# Patient Record
Sex: Female | Born: 2000 | Race: White | Hispanic: Yes | Marital: Married | Smoking: Never smoker
Health system: Southern US, Community
[De-identification: ages and names within clinical notes are randomized; demographics above are authoritative.]

## PROBLEM LIST (undated history)

## (undated) DIAGNOSIS — J101 Influenza due to other identified influenza virus with other respiratory manifestations: Secondary | ICD-10-CM

## (undated) DIAGNOSIS — N186 End stage renal disease: Secondary | ICD-10-CM

## (undated) DIAGNOSIS — D649 Anemia, unspecified: Secondary | ICD-10-CM

## (undated) DIAGNOSIS — K5900 Constipation, unspecified: Secondary | ICD-10-CM

## (undated) DIAGNOSIS — Q8781 Alport syndrome: Secondary | ICD-10-CM

## (undated) DIAGNOSIS — Z9289 Personal history of other medical treatment: Secondary | ICD-10-CM

## (undated) DIAGNOSIS — I5022 Chronic systolic (congestive) heart failure: Secondary | ICD-10-CM

## (undated) DIAGNOSIS — I10 Essential (primary) hypertension: Secondary | ICD-10-CM

## (undated) DIAGNOSIS — H9190 Unspecified hearing loss, unspecified ear: Secondary | ICD-10-CM

## (undated) DIAGNOSIS — Z98818 Other dental procedure status: Secondary | ICD-10-CM

## (undated) DIAGNOSIS — Z973 Presence of spectacles and contact lenses: Secondary | ICD-10-CM

## (undated) DIAGNOSIS — R000 Tachycardia, unspecified: Secondary | ICD-10-CM

## (undated) DIAGNOSIS — F329 Major depressive disorder, single episode, unspecified: Secondary | ICD-10-CM

## (undated) HISTORY — PX: WISDOM TOOTH EXTRACTION: SHX21

## (undated) HISTORY — PX: NEPHRECTOMY: SHX65

## (undated) HISTORY — DX: Influenza due to other identified influenza virus with other respiratory manifestations: J10.1

## (undated) HISTORY — PX: OTHER SURGICAL HISTORY: SHX169

## (undated) HISTORY — DX: Anemia, unspecified: D64.9

## (undated) HISTORY — DX: Major depressive disorder, single episode, unspecified: F32.9

## (undated) HISTORY — DX: Other dental procedure status: Z98.818

## (undated) HISTORY — DX: Constipation, unspecified: K59.00

## (undated) NOTE — *Deleted (*Deleted)
Pharmacy Resident Rounding Note - for learning purposes only, not an active part of the chart    S/o  Admit Complaint: chest, arm, leg pain w/ tingling. 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 D/cd w/o calcium  Home meds - held entresto for hyperK  Anticoagulation hep sq Infectious Disease Cardiovascular  Carvedilol, hydralazine, amlo, isosorbide Endocrinology cbgs <120 Gastrointestinal / Nutrition Neurology Nephrology - access - LUE AV fistula Aranesp 60 mcg IV qMon for anemia of CKD * Outpatient ESA/iron orders: Mircera 75 every 2 weeks (not started) * Last Tsat 22% and ferritin 406 on 11/15 >> IV iron as outpatient  * Last doses of Aranesp given on 11/15 * Hgb 8.3 on 11/15, next dose due 11/22  Calcium carbonate 800 TID, calcitriol 1 TID>> 1.5 BID, Auryixa 420 TID, renal MVI --11/14 - CaGlu x1g 11/15 CoCa 6.8 >> 8.5 11/16 CoCa 7.8 Dialysis - 3.5Ca bath --Last HD 11/15 - BFR 300 x4h, BPs high 160s  Pulmonary Hematology / Oncology PTA Medication Issues Best Practices  Hypocalcemia w/ ESRD - HD 11/15, aranesp given, next HD 11/17  - trying to get to dry weight of 53.5kg (54.4 on admit) >> not likely  - Goal asymptomatic w/ Ca >7 before d/c, need to d/c w/ po calcium  - Continue current meds, monitor lytes   Cirrhosis -CT -liver density w/ congestion, ascites, asymptomatic -Alk phos trending up, manage vol w/ dialysis

---
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
## 2018-02-24 ENCOUNTER — Encounter: Payer: Self-pay | Admitting: Pediatrics

## 2018-06-11 ENCOUNTER — Ambulatory Visit (INDEPENDENT_AMBULATORY_CARE_PROVIDER_SITE_OTHER): Payer: Medicaid Other | Admitting: *Deleted

## 2018-06-11 DIAGNOSIS — Z23 Encounter for immunization: Secondary | ICD-10-CM | POA: Diagnosis not present

## 2018-08-12 HISTORY — PX: KIDNEY TRANSPLANT: SHX239

## 2018-11-11 ENCOUNTER — Ambulatory Visit: Payer: Medicaid Other | Admitting: Pediatrics

## 2018-11-11 ENCOUNTER — Telehealth: Payer: Self-pay

## 2018-11-11 NOTE — Telephone Encounter (Signed)
Patient has an appointment today. Per Dr. Tami Ribas patient should be seen in [facility] ED, [facility] ED or she should call her transplant doctor for guidance. Patient reports that she has an appointment with transplant doctor tomorrow and that Tylenol is controlling her pain. Stressed need to seek care or advice today. understanding verbalized.

## 2019-02-26 ENCOUNTER — Other Ambulatory Visit: Payer: Self-pay

## 2019-02-26 ENCOUNTER — Observation Stay (HOSPITAL_COMMUNITY)
Admission: EM | Admit: 2019-02-26 | Discharge: 2019-02-26 | Disposition: A | Discharge status: Home / Self Care | Payer: Medicare Other | Attending: Emergency Medicine | Admitting: Emergency Medicine

## 2019-02-26 DIAGNOSIS — N189 Chronic kidney disease, unspecified: Secondary | ICD-10-CM

## 2019-02-26 DIAGNOSIS — D649 Anemia, unspecified: Secondary | ICD-10-CM | POA: Diagnosis present

## 2019-02-26 DIAGNOSIS — N186 End stage renal disease: Secondary | ICD-10-CM | POA: Diagnosis not present

## 2019-02-26 DIAGNOSIS — Z9119 Patient's noncompliance with other medical treatment and regimen: Secondary | ICD-10-CM | POA: Diagnosis not present

## 2019-02-26 DIAGNOSIS — Z992 Dependence on renal dialysis: Secondary | ICD-10-CM | POA: Diagnosis not present

## 2019-02-26 DIAGNOSIS — I120 Hypertensive chronic kidney disease with stage 5 chronic kidney disease or end stage renal disease: Principal | ICD-10-CM | POA: Insufficient documentation

## 2019-02-26 DIAGNOSIS — Q8781 Alport syndrome: Secondary | ICD-10-CM | POA: Diagnosis not present

## 2019-02-26 DIAGNOSIS — N920 Excessive and frequent menstruation with regular cycle: Secondary | ICD-10-CM | POA: Diagnosis not present

## 2019-02-26 DIAGNOSIS — R000 Tachycardia, unspecified: Secondary | ICD-10-CM | POA: Diagnosis not present

## 2019-02-26 DIAGNOSIS — F329 Major depressive disorder, single episode, unspecified: Secondary | ICD-10-CM | POA: Diagnosis not present

## 2019-02-26 DIAGNOSIS — N179 Acute kidney failure, unspecified: Secondary | ICD-10-CM

## 2019-02-26 DIAGNOSIS — D631 Anemia in chronic kidney disease: Secondary | ICD-10-CM | POA: Diagnosis present

## 2019-02-26 LAB — RETICULOCYTES
Immature Retic Fract: 19.9 % — ABNORMAL HIGH (ref 2.3–15.9)
RBC.: 1.56 MIL/uL — ABNORMAL LOW (ref 3.87–5.11)
Retic Count, Absolute: 145.2 10*3/uL (ref 19.0–186.0)
Retic Ct Pct: 9.3 % — ABNORMAL HIGH (ref 0.4–3.1)

## 2019-02-26 LAB — URINALYSIS, ROUTINE W REFLEX MICROSCOPIC
Bilirubin Urine: NEGATIVE
Glucose, UA: 50 mg/dL — AB
Ketones, ur: NEGATIVE mg/dL
Nitrite: NEGATIVE
Protein, ur: 100 mg/dL — AB
Specific Gravity, Urine: 1.008 (ref 1.005–1.030)
pH: 9 — ABNORMAL HIGH (ref 5.0–8.0)

## 2019-02-26 LAB — VITAMIN B12: Vitamin B-12: 245 pg/mL (ref 180–914)

## 2019-02-26 LAB — CBC WITH DIFFERENTIAL/PLATELET
Abs Immature Granulocytes: 0.06 10*3/uL (ref 0.00–0.07)
Abs Immature Granulocytes: 0.09 10*3/uL — ABNORMAL HIGH (ref 0.00–0.07)
Basophils Absolute: 0 10*3/uL (ref 0.0–0.1)
Basophils Absolute: 0 10*3/uL (ref 0.0–0.1)
Basophils Relative: 0 %
Basophils Relative: 1 %
Eosinophils Absolute: 0 10*3/uL (ref 0.0–0.5)
Eosinophils Absolute: 0.1 10*3/uL (ref 0.0–0.5)
Eosinophils Relative: 1 %
Eosinophils Relative: 2 %
HCT: 14.6 % — ABNORMAL LOW (ref 36.0–46.0)
HCT: 22 % — ABNORMAL LOW (ref 36.0–46.0)
Hemoglobin: 4.5 g/dL — CL (ref 12.0–15.0)
Hemoglobin: 7 g/dL — ABNORMAL LOW (ref 12.0–15.0)
Immature Granulocytes: 2 %
Immature Granulocytes: 2 %
Lymphocytes Relative: 22 %
Lymphocytes Relative: 24 %
Lymphs Abs: 0.8 10*3/uL (ref 0.7–4.0)
Lymphs Abs: 1.1 10*3/uL (ref 0.7–4.0)
MCH: 30.2 pg (ref 26.0–34.0)
MCH: 29.5 pg (ref 26.0–34.0)
MCHC: 30.8 g/dL (ref 30.0–36.0)
MCHC: 31.8 g/dL (ref 30.0–36.0)
MCV: 98 fL (ref 80.0–100.0)
MCV: 92.8 fL (ref 80.0–100.0)
Monocytes Absolute: 0.4 10*3/uL (ref 0.1–1.0)
Monocytes Absolute: 0.5 10*3/uL (ref 0.1–1.0)
Monocytes Relative: 11 %
Monocytes Relative: 9 %
Neutro Abs: 2.4 10*3/uL (ref 1.7–7.7)
Neutro Abs: 3 10*3/uL (ref 1.7–7.7)
Neutrophils Relative %: 64 %
Neutrophils Relative %: 62 %
Platelets: 155 10*3/uL (ref 150–400)
Platelets: 170 10*3/uL (ref 150–400)
RBC: 1.49 MIL/uL — ABNORMAL LOW (ref 3.87–5.11)
RBC: 2.37 MIL/uL — ABNORMAL LOW (ref 3.87–5.11)
RDW: 18.9 % — ABNORMAL HIGH (ref 11.5–15.5)
RDW: 16.1 % — ABNORMAL HIGH (ref 11.5–15.5)
WBC: 3.7 10*3/uL — ABNORMAL LOW (ref 4.0–10.5)
WBC: 4.8 10*3/uL (ref 4.0–10.5)
nRBC: 0 % (ref 0.0–0.2)
nRBC: 0 % (ref 0.0–0.2)

## 2019-02-26 LAB — BASIC METABOLIC PANEL
Anion gap: 12 (ref 5–15)
BUN: 20 mg/dL (ref 6–20)
CO2: 28 mmol/L (ref 22–32)
Calcium: 9.2 mg/dL (ref 8.9–10.3)
Chloride: 99 mmol/L (ref 98–111)
Creatinine, Ser: 7.45 mg/dL — ABNORMAL HIGH (ref 0.44–1.00)
GFR calc Af Amer: 8 mL/min — ABNORMAL LOW (ref >60)
GFR calc non Af Amer: 7 mL/min — ABNORMAL LOW (ref >60)
Glucose, Bld: 92 mg/dL (ref 70–99)
Potassium: 3.3 mmol/L — ABNORMAL LOW (ref 3.5–5.1)
Sodium: 139 mmol/L (ref 135–145)

## 2019-02-26 LAB — FERRITIN: Ferritin: 240 ng/mL (ref 11–307)

## 2019-02-26 LAB — ABO/RH: ABO/RH(D): O POS

## 2019-02-26 LAB — IRON AND TIBC
Iron: 43 ug/dL (ref 28–170)
Saturation Ratios: 20 % (ref 10.4–31.8)
TIBC: 218 ug/dL — ABNORMAL LOW (ref 250–450)
UIBC: 175 ug/dL

## 2019-02-26 LAB — PREPARE RBC (CROSSMATCH)

## 2019-02-26 LAB — FOLATE: Folate: 10.2 ng/mL (ref >5.9)

## 2019-02-26 LAB — POC URINE PREG, ED: Preg Test, Ur: NEGATIVE

## 2019-02-26 MED ORDER — ACETAMINOPHEN 650 MG RE SUPP: 650.0000 mg | Freq: Four times a day (QID) | RECTAL | Status: DC | PRN

## 2019-02-26 MED ORDER — TACROLIMUS 4 MG PO TB24: 12.0000 mg | ORAL_TABLET | Freq: Every day | ORAL | Status: DC

## 2019-02-26 MED ORDER — FUROSEMIDE 10 MG/ML IJ SOLN
20.0000 mg | Freq: Once | INTRAMUSCULAR | Status: AC
  Administered 2019-02-26: 20 mg via INTRAVENOUS
  Filled 2019-02-26: qty 2

## 2019-02-26 MED ORDER — POTASSIUM CHLORIDE CRYS ER 20 MEQ PO TBCR
20.0000 meq | EXTENDED_RELEASE_TABLET | Freq: Once | ORAL | Status: AC
  Administered 2019-02-26: 22:00:00 20 meq via ORAL
  Filled 2019-02-26: qty 1

## 2019-02-26 MED ORDER — SULFAMETHOXAZOLE-TRIMETHOPRIM 400-80 MG PO TABS: 1.0000 | ORAL_TABLET | ORAL | Status: DC

## 2019-02-26 MED ORDER — SODIUM CHLORIDE 0.9 % IV SOLN: 10.0000 mL/h | Freq: Once | INTRAVENOUS | Status: DC

## 2019-02-26 MED ORDER — AMLODIPINE BESYLATE 5 MG PO TABS: 10.0000 mg | ORAL_TABLET | Freq: Every day | ORAL | Status: DC

## 2019-02-26 MED ORDER — MYCOPHENOLATE SODIUM 180 MG PO TBEC: 180.0000 mg | DELAYED_RELEASE_TABLET | Freq: Two times a day (BID) | ORAL | Status: DC

## 2019-02-26 MED ORDER — ACETAMINOPHEN 325 MG PO TABS: 650.0000 mg | ORAL_TABLET | Freq: Four times a day (QID) | ORAL | Status: DC | PRN

## 2019-02-26 MED ORDER — PANTOPRAZOLE SODIUM 40 MG PO TBEC: 40.0000 mg | DELAYED_RELEASE_TABLET | Freq: Every day | ORAL | Status: DC

## 2019-02-26 MED ORDER — FUROSEMIDE 20 MG PO TABS: 40.0000 mg | ORAL_TABLET | Freq: Every day | ORAL | Status: DC

## 2019-02-26 MED ORDER — ALLOPURINOL 100 MG PO TABS: 200.0000 mg | ORAL_TABLET | Freq: Every day | ORAL | Status: DC

## 2019-02-26 MED ORDER — PREDNISONE 5 MG PO TABS: 5.0000 mg | ORAL_TABLET | Freq: Every day | ORAL | Status: DC

## 2019-02-26 MED ORDER — VALGANCICLOVIR HCL 450 MG PO TABS: 450.0000 mg | ORAL_TABLET | Freq: Every day | ORAL | Status: DC

## 2019-02-26 MED ORDER — SEVELAMER CARBONATE 800 MG PO TABS: 800.0000 mg | ORAL_TABLET | Freq: Three times a day (TID) | ORAL | Status: DC

## 2019-02-26 MED ORDER — ONDANSETRON 4 MG PO TBDP: 4.0000 mg | ORAL_TABLET | Freq: Three times a day (TID) | ORAL | Status: DC | PRN

## 2019-02-26 NOTE — Consult Note (Addendum)
History and Physical    DOA: 02/26/2019  PCP: Carmie End, MD  Patient coming from: Home/referred through dialysis unit Consult requested : ED physician for admission to Hospitalist service Chief Complaint: Low blood count  HPI: Dominique Lawrence is a 18 y.o. female with history h/o kidney transplant 08/12/2018. 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  Patient is now dialysis dependent 3 times a week (MWF) and follows [facility] in [place].  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 She is continued on immunosuppressants to prevent frank symptoms of rejection but not a candidate for transplant consideration for few years given h/o non compliance per [facility] records. Patient underwent dialysis 2 days back and apparently was told to have a hemoglobin less than 5 when directed to come to the ED but she did not.  She went to dialysis unit today and they refused to dialyze with such a low hemoglobin and referred her to the ED again.  In the emergency department, lab work-up confirmed hemoglobin of 4.5 and hematocrit of 14.6.  Patient is asymptomatic but reports recent episode of menorrhagia with her.  Lasting 7 days as opposed to her norm of 4 days.  ED physician discussed with nephrology and initiated blood transfusion with 3 units ordered.  Hospitalist service requested to admit this patient.   Review of Systems: As per HPI otherwise 10 point review of systems negative.    Past Medical History:  Diagnosis Date  . Alport syndrome   . Alport syndrome   . Constipation 12/18/2013  . Depression 07/28/2013  . Influenza B   . S/P wisdom tooth extraction 06/22/2016   Right mandibular dental pain    No past surgical history on  file.  Social history:  reports that she has never smoked. She has never used smokeless tobacco. She reports that she does not drink alcohol or use drugs.   Allergies  Allergen Reactions  . Enalapril Other (See Comments)    AKI  . Chlorhexidine Gluconate Hives  . Ibuprofen Other (See Comments)    Pt not able to take ibuprofen due to Alport's syndrome    Family History  Problem Relation Age of Onset  . Alport syndrome Brother       Prior to Admission medications   Medication Sig Start Date End Date Taking? Authorizing Provider  amLODipine (NORVASC) 10 MG tablet Take 10 mg by mouth daily.   Yes [provider]  etonogestrel (NEXPLANON) 68 MG IMPL implant 68 mg by Subdermal route once. Jan 2018   Yes [provider]  furosemide (LASIX) 40 MG tablet Take 40 mg by mouth daily.   Yes [provider]  mycophenolate (MYFORTIC) 180 MG EC tablet Take 180 mg by mouth 2 (two) times a day. 02/09/19 05/10/19 Yes [provider]  omeprazole (PRILOSEC) 20 MG capsule Take 20 mg by mouth daily.   Yes [provider]  predniSONE (DELTASONE) 5 MG tablet Take 5 mg by mouth daily with breakfast.   Yes [provider]  sevelamer carbonate (RENVELA) 800 MG tablet Take 800 mg by mouth 3 (three) times daily with meals.   Yes [provider]  sulfamethoxazole-trimethoprim (BACTRIM) 400-80 MG tablet Take 1 tablet by mouth every Monday, Wednesday, and Friday.   Yes [provider]  Tacrolimus 4 MG TB24 Take 12 mg by mouth daily. 02/08/19  Yes [provider]  valGANciclovir (VALCYTE) 450 MG tablet Take 450 mg by mouth daily.   Yes [provider]  allopurinol (ZYLOPRIM) 100 MG tablet Take 2 tablets (200 mg total) by mouth daily. Patient not taking: Reported on 02/26/2019 04/23/17   Caroline More, DO  calcitRIOL (ROCALTROL) 0.5 MCG capsule Take 1 capsule (0.5 mcg total) by mouth daily. Patient not taking: Reported on  02/26/2019 04/22/17   Caroline More, DO  ondansetron (ZOFRAN ODT) 4 MG disintegrating tablet Take 1 tablet (4 mg total) by mouth every 8 (eight) hours as needed for nausea or vomiting. Patient not taking: Reported on 02/26/2019 10/23/17   Recardo Evangelist, PA-C  sodium bicarbonate 650 MG tablet Take 1 tablet (650 mg total) by mouth 2 (two) times daily. Patient not taking: Reported on 02/26/2019 04/22/17   Caroline More, DO    Physical Exam: Vitals:   02/26/19 1348 02/26/19 1350  BP: (!) 140/93 (!) 140/93  Pulse: (!) 120 (!) 117  Resp:  18  Temp:  98.8 F (37.1 C)  TempSrc:  Oral  SpO2: 99% 100%    Constitutional: NAD, calm, comfortable Eyes: PERRL, lids and conjunctivae normal ENMT: Mucous membranes are moist. Posterior pharynx clear of any exudate or lesions.Normal dentition.  Neck: normal, supple, no masses, no thyromegaly Respiratory: clear to auscultation bilaterally, no wheezing, no crackles. Normal respiratory effort. No accessory muscle use.  Cardiovascular: Regular rate and rhythm, no murmurs / rubs / gallops. No extremity edema. 2+ pedal pulses. No carotid bruits.  Abdomen: no tenderness, no masses palpated. No hepatosplenomegaly. Bowel sounds positive.  Musculoskeletal: no clubbing / cyanosis. No joint deformity upper and lower extremities. Good ROM, no contractures. Normal muscle tone.  Neurologic: CN 2-12 grossly intact. Sensation intact, DTR normal. Strength 5/5 in all 4.  Psychiatric: Normal judgment and insight. Alert and oriented x 3. Normal mood.  SKIN/catheters: no rashes, lesions, ulcers. No induration  Labs on Admission: I have personally reviewed following labs and imaging studies  CBC: Recent Labs  Lab 02/26/19 1357  WBC 3.7*  NEUTROABS 2.4  HGB 4.5*  HCT 14.6*  MCV 98.0  PLT 160   Basic Metabolic Panel: Recent Labs  Lab 02/26/19 1357  NA 139  K 3.3*  CL 99  CO2 28  GLUCOSE 92  BUN 20  CREATININE 7.45*  CALCIUM 9.2   GFR: CrCl cannot be  calculated (Unknown ideal weight.). Liver Function Tests: No results for input(s): AST, ALT, ALKPHOS, BILITOT, PROT, ALBUMIN in the last 168 hours. No results for input(s): LIPASE, AMYLASE in the last 168 hours. No results for input(s): AMMONIA in the last 168 hours. Coagulation Profile: No results for input(s): INR, PROTIME in the last 168 hours. Cardiac Enzymes: No results for input(s): CKTOTAL, CKMB, CKMBINDEX, TROPONINI in the last 168 hours. BNP (last 3 results) No results for input(s): PROBNP in the last 8760 hours. HbA1C: No results for input(s): HGBA1C in the last 72 hours. CBG: No results for input(s): GLUCAP in the last 168 hours. Lipid Profile: No results for input(s): CHOL, HDL, LDLCALC, TRIG, CHOLHDL, LDLDIRECT in the last 72 hours. Thyroid Function Tests: No results for input(s): TSH, T4TOTAL, FREET4, T3FREE, THYROIDAB in the last 72 hours. Anemia Panel: No results for input(s): VITAMINB12, FOLATE, FERRITIN, TIBC, IRON, RETICCTPCT in the last 72 hours. Urine analysis:    Component Value Date/Time   COLORURINE STRAW (A) 02/26/2019 1404  APPEARANCEUR CLEAR 02/26/2019 1404   LABSPEC 1.008 02/26/2019 1404   PHURINE 9.0 (H) 02/26/2019 1404   GLUCOSEU 50 (A) 02/26/2019 1404   HGBUR SMALL (A) 02/26/2019 1404   BILIRUBINUR NEGATIVE 02/26/2019 1404   BILIRUBINUR neg 08/11/2017 1139   KETONESUR NEGATIVE 02/26/2019 1404   PROTEINUR 100 (A) 02/26/2019 1404   UROBILINOGEN negative (A) 08/11/2017 1139   UROBILINOGEN 0.2 02/26/2015 1600   NITRITE NEGATIVE 02/26/2019 1404   LEUKOCYTESUR SMALL (A) 02/26/2019 1404    Radiological Exams on Admission: No results found.     Assessment and Plan:   Patient seen earlier today and admission orders placed for observation status with the plan to complete 3 units of PRBC transfusion and Lasix 20 mg IV after second unit.  Patient however declined COVID screening per admission protocol.  Explained the importance of screening  especially given her immunosuppressed state and need for inpatient dialysis.  She stated she would think about it.  She was advised to follow-up PCP/GYN as outpatient for menorrhagia to rule out fibroids.  She also states her baseline hemoglobin is around 7.  She likely has underlying anemia of chronic disease secondary to CKD.  Informed by ED nurse at 7 pm that patient was seen by nephrology subsequent to my visit and they have explained to patient that they would not be able to dialyze her in the hospital if she did not undergo COVID screening but can see her as outpatient.  Discussed with the ED physician/PA who have advised that they will complete 2 units of PRBC in the ED and revisit the plan.  She might sign out AMA.  Will rescind the admission order at this time.  Please reconsult hospitalist service if situation changes or admission to medical service required.    Guilford Shi MD [facility] Pager [phone]  If 7PM-7AM, please contact night-coverage [email or web address] Password [facility]  02/26/2019, 3:31 PM

## 2019-02-26 NOTE — Discharge Instructions (Addendum)
As we discussed, your blood levels were low today.  These return to the emergency department at anytime for any symptoms.

## 2019-02-26 NOTE — ED Provider Notes (Signed)
Care assumed from  Suella Broad, PA-C at shift change with re-evaluation pending.   In brief, this patient is a 18 y.o. F with PMH/o Alport syndrome, attends M/W/F dialysis with last dialysis appointment on Wednesday who presents for evaluation of abnormal lab.  Patient with dialysis and was told that her hemoglobin was low.  She did not come to the ER initially.  She returned to dialysis today but was sent to the ED given low hemoglobin.  Please see note from previous provider for full history/physical exam     Physical Exam  BP (!) 146/110 (BP Location: Right Arm)   Pulse (!) 106   Temp 98 F (36.7 C) (Oral)   Resp (!) 22   LMP 02/16/2019   SpO2 99%   Physical Exam   No evidence of respiratory distress. Comfortably in bed.  ED Course/Procedures   Clinical Course as of Feb 25 2313  Fri Feb 26, 2019  1542 17yo female presents to the ER for anemia, had dialysis on Wednesday and was told she was anemic and needed to go to the ER, patient did not. Patient went back to dialysis today but was not allowed to have dialysis due to her anemia and sent to the ER. Patient is tachycardic and pale on arrival, denies any complaints. Hgb 4.5, plan is transfuse. BMP with K 3.3, Cr 7.45. Consult to nephrology who will see the patient. Consult with hospitlist who will consult for admission. Patient is refusing COVID testing, states she does not feel she should be tested since she does not have any symptoms. Patient states she would rather leave and possibly die than be tested. Patient is agreeable to stay in the ER for now and await her consults.    [LM]  48 17yo female presents to the ER for anemia, had dialysis on Wednesday and was told she was anemic and needed to go to the ER, patient did not. Patient went back to dialysis today but was not allowed to have dialysis due to her anemia and sent to the ER. Patient is tachycardic and pale on arrival, denies any complaints. Hgb 4.5, plan is transfuse.  Consult to nephrology who will see the patient. 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    [LM]    Clinical Course User Index [LM] Tacy Learn, PA-C    Procedures  MDM   PLAN: Patient with hemoglobin of 4.5.  Plan for transfusion.  Additionally, patient in need of dialysis.  Per previous provider, patient refusing COVID testing.  Hospital and nephrology has seen.  If patient not agreeable to COVID testing, plan to leave AMA.  Currently, she is receiving 2 units of blood and Lasix.  She will receive 1/3 unit at which time, we will decide if she will get a COVID test and be admitted or leave AMA.  MDM: Repeat CBC shows hemoglobin at 7.0.  Discussed with patient.  Patient still not agreeable to admission here.  She states that she wants to leave.  I did offer a third transfusion to continue to help with hemoglobin but patient declines.  I discussed the risk versus benefits of leaving [facility], including but not limited to worsening condition, death.  I again offered patient admission and explained my concerns that would  require for admission.  Patient states that she does not want to be admitted and would like to leave [place].  Patient states that it with even without cover testing, she wants to leave.  Patient will be discharged [place].  Portions of this note were generated with Lobbyist. Dictation errors may occur despite best attempts at proofreading.    1. Anemia, unspecified type   2. Acute renal failure superimposed on chronic kidney disease, unspecified CKD stage, unspecified acute renal failure type [facility])        Volanda Napoleon, PA-C 02/26/19 2315    Dorie Rank, MD 02/27/19 [phone]

## 2019-02-26 NOTE — ED Notes (Signed)
Pt actively refusing the covid swab

## 2019-02-26 NOTE — Consult Note (Signed)
Reason for Consult: To manage dialysis and dialysis related needs Referring Physician: Dr. Colin Mulders Dominique Lawrence is an 18 y.o. female.  HPI:  Pt is a 77F with PMH of Ktx for alport syndrome, recently failed and now on HD who presents for low Hgb.  Hgb was 4.4 on HD labs and was directed to come to ED for admission.    Pt says she feels "fine." States she was on her menses from last Sunday to Tuesday.  Initially passing clots and now not.  Doesn't think she's pregnant.  She has declined a COVID test multiple times, stating "I feel fine" and "I don't want it".     She is slated to receive 3 u pRBCs with some Lasix.  She still makes lots of urine.  Last HD Wednesday, due for HD today.  Dialysis orders: [facility] MWF 4 hrs F180  BFR 350 2K/ 2Ca TDC Mircera 75 mcg q 2 weeks Venofer q weekly  Past Medical History:  Diagnosis Date  . Alport syndrome   . Alport syndrome   . Constipation 12/18/2013  . Depression 07/28/2013  . Influenza B   . S/P wisdom tooth extraction 06/22/2016   Right mandibular dental pain    No past surgical history on file.  Family History  Problem Relation Age of Onset  . Alport syndrome Brother     Social History:  reports that she has never smoked. She has never used smokeless tobacco. She reports that she does not drink alcohol or use drugs.  Allergies:  Allergies  Allergen Reactions  . Enalapril Other (See Comments)    AKI  . Chlorhexidine Gluconate Hives  . Ibuprofen Other (See Comments)    Pt not able to take ibuprofen due to Alport's syndrome    Medications: Scheduled:    Results for orders placed or performed during the hospital encounter of 02/26/19 (from the past 48 hour(s))  Basic metabolic panel     Status: Abnormal   Collection Time: 02/26/19  1:57 PM  Result Value Ref Range   Sodium 139 135 - 145 mmol/L   Potassium 3.3 (L) 3.5 - 5.1 mmol/L   Chloride 99 98 - 111 mmol/L   CO2 28 22 - 32 mmol/L   Glucose, Bld 92 70 - 99 mg/dL   BUN  20 6 - 20 mg/dL   Creatinine, Ser 7.45 (H) 0.44 - 1.00 mg/dL   Calcium 9.2 8.9 - 10.3 mg/dL   GFR calc non Af Amer 7 (L) >60 mL/min   GFR calc Af Amer 8 (L) >60 mL/min   Anion gap 12 5 - 15    Comment: Performed at [facility], [address], [place], [place] [address]  CBC with Differential     Status: Abnormal   Collection Time: 02/26/19  1:57 PM  Result Value Ref Range   WBC 3.7 (L) 4.0 - 10.5 K/uL   RBC 1.49 (L) 3.87 - 5.11 MIL/uL   Hemoglobin 4.5 (LL) 12.0 - 15.0 g/dL    Comment: REPEATED TO VERIFY THIS CRITICAL RESULT HAS VERIFIED AND BEEN CALLED TO E.PRYERS RN BY KATHERINE MCCORMICK ON 06 19 2020 AT 0086, AND HAS BEEN READ BACK.     HCT 14.6 (L) 36.0 - 46.0 %   MCV 98.0 80.0 - 100.0 fL   MCH 30.2 26.0 - 34.0 pg   MCHC 30.8 30.0 - 36.0 g/dL   RDW 18.9 (H) 11.5 - 15.5 %   Platelets 155 150 - 400  K/uL   nRBC 0.0 0.0 - 0.2 %   Neutrophils Relative % 64 %   Neutro Abs 2.4 1.7 - 7.7 K/uL   Lymphocytes Relative 22 %   Lymphs Abs 0.8 0.7 - 4.0 K/uL   Monocytes Relative 11 %   Monocytes Absolute 0.4 0.1 - 1.0 K/uL   Eosinophils Relative 1 %   Eosinophils Absolute 0.0 0.0 - 0.5 K/uL   Basophils Relative 0 %   Basophils Absolute 0.0 0.0 - 0.1 K/uL   Immature Granulocytes 2 %   Abs Immature Granulocytes 0.06 0.00 - 0.07 K/uL    Comment: Performed at [facility] [address], [place], [place] [address]  Urinalysis, Routine w reflex microscopic     Status: Abnormal   Collection Time: 02/26/19  2:04 PM  Result Value Ref Range   Color, Urine STRAW (A) YELLOW   APPearance CLEAR CLEAR   Specific Gravity, Urine 1.008 1.005 - 1.030   pH 9.0 (H) 5.0 - 8.0   Glucose, UA 50 (A) NEGATIVE mg/dL   Hgb urine dipstick SMALL (A) NEGATIVE   Bilirubin Urine NEGATIVE NEGATIVE   Ketones, ur NEGATIVE NEGATIVE mg/dL   Protein, ur 100 (A) NEGATIVE mg/dL   Nitrite NEGATIVE NEGATIVE   Leukocytes,Ua SMALL (A) NEGATIVE   RBC / HPF 21-50 0 - 5 RBC/hpf   WBC, UA 21-50 0 - 5 WBC/hpf    Bacteria, UA RARE (A) NONE SEEN   Squamous Epithelial / LPF 0-5 0 - 5   Sperm, UA PRESENT     Comment: Performed at [facility], [address] [place], [place] [address]  POC urine preg, ED     Status: None   Collection Time: 02/26/19  2:28 PM  Result Value Ref Range   Preg Test, Ur NEGATIVE NEGATIVE    Comment:        THE SENSITIVITY OF THIS METHODOLOGY IS >24 mIU/mL   Type and screen [place]     Status: None (Preliminary result)   Collection Time: 02/26/19  2:47 PM  Result Value Ref Range   ABO/RH(D) O POS    Antibody Screen NEG    Sample Expiration 03/01/2019,2359    Unit Number [id number]    Blood Component Type RCLI PHER 2    Unit division 00    Status of Unit ALLOCATED    Transfusion Status OK TO TRANSFUSE    Crossmatch Result Compatible    Unit Number [id number]    Blood Component Type RCLI PHER 2    Unit division 00    Status of Unit ALLOCATED    Transfusion Status OK TO TRANSFUSE    Crossmatch Result Compatible    Unit Number [id number]    Blood Component Type RBC, LR IRR    Unit division 00    Status of Unit ISSUED    Transfusion Status OK TO TRANSFUSE    Crossmatch Result Compatible   Prepare RBC     Status: None   Collection Time: 02/26/19  2:47 PM  Result Value Ref Range   Order Confirmation      ORDER PROCESSED BY BLOOD BANK Performed at [facility], [place] [address] [place], [place] [address]   ABO/Rh     Status: None (Preliminary result)   Collection Time: 02/26/19  2:47 PM  Result Value Ref Range   ABO/RH(D)      O POS Performed at [facility] [address] [place], [place]  [address]     No results found.  ROS: all other systems reviewed and are negative except as per HPI Blood pressure (!) 125/92, pulse (!) 109, temperature 98.5 F (36.9 C), temperature source Oral, resp. rate (!) 24, last menstrual period 02/16/2019, SpO2 100 %. .  GEN: NAD, lying in bed HEENT EOMI PERRL pale  conjunctiva NECK no JVD PULM normal WOB, clear CV tachycardic no m/r/g ABD soft nondistended, graft without tenderness EXT no LE edema NEURO AAO x 3 ACCESS: R IJ TDC  Assessment/Plan: 1 Severe anemia: getting 3 u pRBCs, Lasix.  Goal Hgb 7.0. Got mircera 75 mcg 6/11.   2 ESRD: MWF.  No volume excess on exam for now but she hasn't received the 3 u pRBCs yet.  Will run tomorrow off schedule if here.   3 Hypertension: soft-ish, no antihypertensives 4. 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  Dispo: has repeatedly declined COVID testing  Damian Buckles 02/26/2019, 4:53 PM

## 2019-02-26 NOTE — ED Notes (Signed)
Pt actively refusing COVID swab. Consulted nephrology, admitting and ed doctor. Pt will receive RBC's and then sign an ama form. Dialysis has refused the pt as well as the floor due to the refusal of the swab. Pt will receive 2 blood admins and call for a dialysis appt tomorrow. Pt has been advised to fluid restrict until the dialysis treatment.

## 2019-02-26 NOTE — ED Notes (Signed)
Pt repeatedly and strongly encouraged to stay at hospital for admission and tx of low hgb. Pt adamant about leaving. She was informed that she would be leaving AMA.    Patient verbalizes understanding of discharge instructions. Opportunity for questioning and answers were provided. Armband removed by staff, pt discharged from ED.

## 2019-02-26 NOTE — ED Provider Notes (Signed)
[facility] EMERGENCY DEPARTMENT Provider Note   CSN: [id number] Arrival date & time: 02/26/19  1332    History   Chief Complaint Chief Complaint  Patient presents with  . Tachycardia    HPI Dominique Lawrence is a 18 y.o. female.     18 year old female with history of Alport syndrome, attends dialysis M/W/F, last went to dialysis on Wednesday, renal transplant in December 2019.  Patient states she went to dialysis last on Wednesday, was told her hemoglobin was 4 and she needed to go to the emergency room.  Patient did not come to the ER at that time, returned to dialysis today and did not receive treatment, was redirected to the emergency room.  Patient denies any hematemesis, blood in her stool, states she recently started birth control and had a heavy menstrual cycle this month but does not have a history of prior heavy cycles. Has had 1 prior transfusion years ago, states her normal hgb is 7.  Patient denies chest pain, shortness of breath, weakness, dizziness.  States that she had a slight headache on Monday and did not feel well in general however has not had any other symptoms or complaints.     Past Medical History:  Diagnosis Date  . Alport syndrome   . Alport syndrome   . Constipation 12/18/2013  . Depression 07/28/2013  . Influenza B   . S/P wisdom tooth extraction 06/22/2016   Right mandibular dental pain    Patient Active Problem List   Diagnosis Date Noted  . Nexplanon in place 11/23/2017  . Avitaminosis D 12/23/2015  . Acne vulgaris 09/25/2015  . Kidney cysts 09/13/2015  . Renal insufficiency 09/12/2015  . Constipation 07/07/2015  . Hearing loss 03/01/2015  . Failed vision screen 09/14/2014  . Chronic kidney disease (CKD) stage G1/A1, glomerular filtration rate (GFR) equal to or greater than 90 mL/min/1.73 square meter and albuminuria creatinine ratio less than 30 mg/g 11/13/2013  . 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  . Anemia in chronic  kidney disease 07/28/2013  . Overweight, pediatric, BMI 85.0-94.9 percentile for age 49/19/2014    No past surgical history on file.   OB History   No obstetric history on file.      Home Medications    Prior to Admission medications   Medication Sig Start Date End Date Taking? Authorizing Provider  amLODipine (NORVASC) 10 MG tablet Take 10 mg by mouth daily.   Yes [provider]  etonogestrel (NEXPLANON) 68 MG IMPL implant 68 mg by Subdermal route once. Jan 2018   Yes [provider]  furosemide (LASIX) 40 MG tablet Take 40 mg by mouth daily.   Yes [provider]  mycophenolate (MYFORTIC) 180 MG EC tablet Take 180 mg by mouth 2 (two) times a day. 02/09/19 05/10/19 Yes [provider]  omeprazole (PRILOSEC) 20 MG capsule Take 20 mg by mouth daily.   Yes [provider]  predniSONE (DELTASONE) 5 MG tablet Take 5 mg by mouth daily with breakfast.   Yes [provider]  sevelamer carbonate (RENVELA) 800 MG tablet Take 800 mg by mouth 3 (three) times daily with meals.   Yes [provider]  sulfamethoxazole-trimethoprim (BACTRIM) 400-80 MG tablet Take 1 tablet by mouth every Monday, Wednesday, and Friday.   Yes [provider]  Tacrolimus 4 MG TB24 Take 12 mg by mouth daily. 02/08/19  Yes [provider]  valGANciclovir (VALCYTE) 450 MG tablet Take 450 mg by mouth daily.   Yes  [provider]  allopurinol (ZYLOPRIM) 100 MG tablet Take 2 tablets (200 mg total) by mouth daily. Patient not taking: Reported on 02/26/2019 04/23/17   Caroline More, DO  calcitRIOL (ROCALTROL) 0.5 MCG capsule Take 1 capsule (0.5 mcg total) by mouth daily. Patient not taking: Reported on 02/26/2019 04/22/17   Caroline More, DO  ondansetron (ZOFRAN ODT) 4 MG disintegrating tablet Take 1 tablet (4 mg total) by mouth every 8 (eight) hours as needed for nausea or vomiting. Patient not taking: Reported on 02/26/2019 10/23/17   Recardo Evangelist, PA-C  sodium bicarbonate 650 MG tablet Take 1 tablet (650 mg total) by mouth 2 (two) times daily. Patient not taking: Reported on 02/26/2019 04/22/17   Caroline More, DO    Family History Family History  Problem Relation Age of Onset  . Alport syndrome Brother     Social History Social History   Tobacco Use  . Smoking status: Never Smoker  . Smokeless tobacco: Never Used  Substance Use Topics  . Alcohol use: No  . Drug use: No     Allergies   Enalapril, Chlorhexidine gluconate, and Ibuprofen   Review of Systems Review of Systems  Constitutional: Negative for fatigue and fever.  Respiratory: Negative for shortness of breath.   Cardiovascular: Negative for chest pain.  Gastrointestinal: Negative for abdominal pain, blood in stool, constipation, diarrhea, nausea and vomiting.  Musculoskeletal: Negative for arthralgias and myalgias.  Skin: Negative for wound.  Allergic/Immunologic: Positive for immunocompromised state.  Neurological: Negative for dizziness and weakness.  Psychiatric/Behavioral: Negative for confusion.  All other systems reviewed and are negative.    Physical Exam Updated Vital Signs BP (!) 125/92   Pulse (!) 109   Temp 98.5 F (36.9 C) (Oral)   Resp (!) 24   LMP 02/16/2019   SpO2 100%   Physical Exam Vitals signs and nursing note reviewed.  Constitutional:      General: She is not in acute distress.    Appearance: She is not ill-appearing.  HENT:     Head: Normocephalic and atraumatic.     Comments: Mucosa pale Eyes:     Comments: Conjunctiva pale  Cardiovascular:     Rate and Rhythm: Regular rhythm. Tachycardia present.     Pulses: Normal pulses.     Heart sounds: Normal heart sounds.  Pulmonary:     Effort: Pulmonary effort is normal.     Breath sounds: Normal breath sounds.  Abdominal:     Palpations: Abdomen is soft.     Tenderness: There is no abdominal tenderness.  Skin:    General: Skin is warm and dry.      Coloration: Skin is pale.  Neurological:     General: No focal deficit present.     Mental Status: She is alert.  Psychiatric:        Mood and Affect: Mood normal.        Behavior: Behavior normal.      ED Treatments / Results  Labs (all labs ordered are listed, but only abnormal results are displayed) Labs Reviewed  BASIC METABOLIC PANEL - Abnormal; Notable for the following components:      Result Value   Potassium 3.3 (*)    Creatinine, Ser 7.45 (*)    GFR calc non Af Amer 7 (*)    GFR calc Af Amer 8 (*)    All other components within normal limits  CBC WITH DIFFERENTIAL/PLATELET - Abnormal; Notable for the following components:   WBC 3.7 (*)  RBC 1.49 (*)    Hemoglobin 4.5 (*)    HCT 14.6 (*)    RDW 18.9 (*)    All other components within normal limits  URINALYSIS, ROUTINE W REFLEX MICROSCOPIC - Abnormal; Notable for the following components:   Color, Urine STRAW (*)    pH 9.0 (*)    Glucose, UA 50 (*)    Hgb urine dipstick SMALL (*)    Protein, ur 100 (*)    Leukocytes,Ua SMALL (*)    Bacteria, UA RARE (*)    All other components within normal limits  SARS CORONAVIRUS 2 (HOSPITAL ORDER, [facility])  VITAMIN B12  FOLATE  IRON AND TIBC  FERRITIN  RETICULOCYTES  POC URINE PREG, ED  TYPE AND SCREEN  PREPARE RBC (CROSSMATCH)  ABO/RH    EKG None  Radiology No results found.  Procedures .Critical Care Performed by: Tacy Learn, PA-C Authorized by: Tacy Learn, PA-C   Critical care provider statement:    Critical care time (minutes):  45   Critical care was necessary to treat or prevent imminent or life-threatening deterioration of the following conditions:  Circulatory failure and renal failure   Critical care was time spent personally by me on the following activities:  Discussions with consultants, evaluation of patient's response to treatment, examination of patient, ordering and performing treatments and  interventions, ordering and review of laboratory studies, ordering and review of radiographic studies, pulse oximetry, re-evaluation of patient's condition, obtaining history from patient or surrogate, review of old charts and development of treatment plan with patient or surrogate   (including critical care time)  Medications Ordered in ED Medications  0.9 %  sodium chloride infusion (has no administration in time range)     Initial Impression / Assessment and Plan / ED Course  I have reviewed the triage vital signs and the nursing notes.  Pertinent labs & imaging results that were available during my care of the patient were reviewed by me and considered in my medical decision making (see chart for details).  Clinical Course as of Feb 25 1657  Fri Feb 26, 2019  1542 17yo female presents to the ER for anemia, had dialysis on Wednesday and was told she was anemic and needed to go to the ER, patient did not. Patient went back to dialysis today but was not allowed to have dialysis due to her anemia and sent to the ER. Patient is tachycardic and pale on arrival, denies any complaints. Hgb 4.5, plan is transfuse. BMP with K 3.3, Cr 7.45. Consult to nephrology who will see the patient. Consult with hospitlist who will consult for admission. Patient is refusing COVID testing, states she does not feel she should be tested since she does not have any symptoms. Patient states she would rather leave and possibly die than be tested. Patient is agreeable to stay in the ER for now and await her consults.    [LM]  77 17yo female presents to the ER for anemia, had dialysis on Wednesday and was told she was anemic and needed to go to the ER, patient did not. Patient went back to dialysis today but was not allowed to have dialysis due to her anemia and sent to the ER. Patient is tachycardic and pale on arrival, denies any complaints. Hgb 4.5, plan is transfuse. Consult to nephrology who will see the patient.  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    [LM]    Clinical Course User Index [LM] Tacy Learn, PA-C      Final Clinical Impressions(s) / ED Diagnoses   Final diagnoses:  Anemia, unspecified type  Acute renal failure superimposed on chronic kidney disease, unspecified CKD stage, unspecified acute renal failure type [facility])    ED Discharge Orders    None       Tacy Learn, PA-C 02/26/19 1658    Drenda Freeze, MD 03/04/19 [phone]

## 2019-02-26 NOTE — ED Notes (Signed)
No blood draw at this time,   Pt receiving blood transfusion. 

## 2019-02-26 NOTE — ED Triage Notes (Addendum)
Pt sent by dialysis to get blood. Wend it Hbg 4.4, Been on dialysis a month. Tachy 120. Looks pale. Transplant in December and then had CMV so rejection drugs lowered. Now "Kidney asleep" hoping it will function again but dialysis bridging her over.

## 2019-02-28 LAB — TYPE AND SCREEN
ABO/RH(D): O POS
Antibody Screen: NEGATIVE
Unit division: 0
Unit division: 0
Unit division: 0
Unit division: 0
Unit division: 0

## 2019-02-28 LAB — BPAM RBC
Blood Product Expiration Date: 202007012359
Blood Product Expiration Date: 202007102359
Blood Product Expiration Date: 202007102359
Blood Product Expiration Date: 202007162359
Blood Product Expiration Date: 202007172359
ISSUE DATE / TIME: 202006191557
ISSUE DATE / TIME: 202006191850
Unit Type and Rh: 5100
Unit Type and Rh: 5100
Unit Type and Rh: 5100
Unit Type and Rh: 5100
Unit Type and Rh: 9500

## 2019-04-26 ENCOUNTER — Ambulatory Visit: Payer: Medicare Other | Admitting: Obstetrics and Gynecology

## 2019-05-26 ENCOUNTER — Telehealth: Payer: Self-pay

## 2019-05-26 NOTE — Telephone Encounter (Signed)
Pt called in wanting to speak with Hoyt Koch. She wants to know why her periods have become irregular after her birth control insertion. If someone could reach back out to her at [phone] she would really appreciate it.

## 2019-05-27 ENCOUNTER — Encounter (INDEPENDENT_AMBULATORY_CARE_PROVIDER_SITE_OTHER): Payer: Self-pay | Admitting: Ophthalmology

## 2019-05-27 ENCOUNTER — Encounter (INDEPENDENT_AMBULATORY_CARE_PROVIDER_SITE_OTHER): Payer: Medicare Other | Admitting: Ophthalmology

## 2019-05-27 ENCOUNTER — Other Ambulatory Visit: Payer: Self-pay

## 2019-05-27 ENCOUNTER — Ambulatory Visit (INDEPENDENT_AMBULATORY_CARE_PROVIDER_SITE_OTHER): Payer: Medicare Other | Admitting: Ophthalmology

## 2019-05-27 VITALS — BP 141/107 | HR 99

## 2019-05-27 DIAGNOSIS — H3323 Serous retinal detachment, bilateral: Secondary | ICD-10-CM | POA: Diagnosis not present

## 2019-05-27 DIAGNOSIS — N186 End stage renal disease: Secondary | ICD-10-CM

## 2019-05-27 DIAGNOSIS — Q8781 Alport syndrome: Secondary | ICD-10-CM

## 2019-05-27 DIAGNOSIS — H35033 Hypertensive retinopathy, bilateral: Secondary | ICD-10-CM | POA: Diagnosis not present

## 2019-05-27 DIAGNOSIS — Z992 Dependence on renal dialysis: Secondary | ICD-10-CM

## 2019-05-27 DIAGNOSIS — H3581 Retinal edema: Secondary | ICD-10-CM | POA: Diagnosis not present

## 2019-05-27 NOTE — Progress Notes (Addendum)
[facility] Note  05/27/2019     CHIEF COMPLAINT Patient presents for Retina Evaluation   HISTORY OF PRESENT ILLNESS: Dominique Lawrence Dominique Lawrence is a 18 y.o. female who presents to the clinic today for:   HPI    Retina Evaluation    In right eye.  This started 4 days ago.  Duration of 4 days.  Associated Symptoms Flashes.  Context:  distance vision.  I, the attending physician,  performed the HPI with the patient and updated documentation appropriately.          Comments    New patient retina eval ref by Dr. Frederico Hamman Patient states she woke up on Monday and the center of her vision was missing.  Patient states she has been noticing flashes of light in her left eye since Monday but has not noticed flashes or floaters OD.  Patient denies eye pain.  Patient has Hx of R kidney transplant.       Last edited by Bernarda Caffey, MD on 05/27/2019  8:31 PM. 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 Gevena Cotton who referred her here, pt has also seen Dr. Manuella Ghazi at [facility] in September 2019, she was referred to him by her pediatric kidney dr, pt states after she got her kidney transplant she was being managed by [facility], but since she has had it removed she does not have another appt with them, prior to transplant she was being seen at [facility], pt states that she has been told as long as she is on dialysis she can continue to see the drs there, she states she has been having problems keeping her hemoglobin under control  and her blood pressure has been getting pretty high, pts nephrologist at [facility] is Dr. Ricarda Frame  Referring physician: Gevena Cotton, MD [address],  [place] [address]  HISTORICAL INFORMATION:   Selected notes from the MEDICAL RECORD NUMBER Referred by Dr. Gevena Cotton for concern of RD OD LEE: 09.17.20 Reatha Armour) [BCVA: OD: LP, OS: 20/20] Ocular Hx- PMH-   CURRENT MEDICATIONS: No current outpatient medications on file. (Ophthalmic Drugs)   No current facility-administered medications for this visit.  (Ophthalmic Drugs)   Current Outpatient Medications (Other)  Medication Sig  . allopurinol (ZYLOPRIM) 100 MG tablet Take 2 tablets (200 mg total) by mouth daily. (Patient not taking: Reported on 02/26/2019)  . amLODipine (NORVASC) 10 MG tablet Take 10 mg by mouth daily.  . calcitRIOL (ROCALTROL) 0.5 MCG capsule Take 1 capsule (0.5 mcg total) by mouth daily. (Patient not taking: Reported on 02/26/2019)  . etonogestrel (NEXPLANON) 68 MG IMPL implant 68 mg by Subdermal route once. Jan 2018  . furosemide (LASIX) 40 MG tablet Take 40 mg by mouth daily.  Marland Kitchen omeprazole (PRILOSEC) 20 MG capsule Take 20 mg by mouth daily.  . ondansetron (ZOFRAN ODT) 4 MG disintegrating tablet Take 1 tablet (4 mg total) by mouth every 8 (  eight) hours as needed for nausea or vomiting. (Patient not taking: Reported on 02/26/2019)  . predniSONE (DELTASONE) 5 MG tablet Take 5 mg by mouth daily with breakfast.  . sevelamer carbonate (RENVELA) 800 MG tablet Take 800 mg by mouth 3 (three) times daily with meals.  . sodium bicarbonate 650 MG tablet Take 1 tablet (650 mg total) by mouth 2 (two) times daily. (Patient not taking: Reported on 02/26/2019)  . sulfamethoxazole-trimethoprim (BACTRIM) 400-80 MG tablet Take 1 tablet by mouth every Monday, Wednesday, and Friday.  . Tacrolimus 4 MG TB24 Take 12 mg by mouth daily.  . valGANciclovir (VALCYTE) 450 MG tablet Take 450 mg by mouth daily.    No current facility-administered medications for this visit.  (Other)      REVIEW OF SYSTEMS: ROS    Positive for: Eyes   Negative for: Constitutional, Gastrointestinal, Neurological, Skin, Genitourinary, Musculoskeletal, HENT, Endocrine, Cardiovascular, Respiratory, Psychiatric, Allergic/Imm, Heme/Lymph   Last edited by Doneen Poisson on 05/27/2019  1:47 PM. (History)       ALLERGIES Allergies  Allergen Reactions  . Enalapril Other (See Comments)    AKI  . Chlorhexidine Gluconate Hives  . Ibuprofen Other (See Comments)    Pt not able to take ibuprofen due to Alport's syndrome    PAST MEDICAL HISTORY Past Medical History:  Diagnosis Date  . Alport syndrome   . Alport syndrome   . Constipation 12/18/2013  . Depression 07/28/2013  . Influenza B   . S/P wisdom tooth extraction 06/22/2016   Right mandibular dental pain   History reviewed. No pertinent surgical history.  FAMILY HISTORY Family History  Problem Relation Age of Onset  . Alport syndrome Brother     SOCIAL HISTORY Social History   Tobacco Use  . Smoking status: Never Smoker  . Smokeless tobacco: Never Used  Substance Use Topics  . Alcohol use: No  . Drug use: No         OPHTHALMIC EXAM:  Base Eye Exam    Visual Acuity (Snellen - Linear)      Right Left   Dist cc CF @ 4' 20/30 -2   Dist ph cc NI NI   Correction: Glasses  OD: CF 4' eccentric vision       Tonometry (Tonopen, 2:00 PM)      Right Left   Pressure 18 18       Pupils      Dark Light Shape React APD   Right 7 6 Round 2+ 2+   Left 7 6 Round 2+ 0       Visual Fields      Left Right    Full    Restrictions  Total superior temporal, inferior temporal deficiencies       Extraocular Movement      Right Left    Full Full       Neuro/Psych    Oriented x3: Yes   Mood/Affect: Normal       Dilation    Both eyes: 1.0% Mydriacyl, 2.5% Phenylephrine @ 2:00 PM        Slit Lamp and Fundus Exam    Slit Lamp Exam       Right Left   Lids/Lashes Normal Normal   Conjunctiva/Sclera White and quiet White and quiet   Cornea 2+ Punctate epithelial erosions Clear   Anterior Chamber Deep and quiet Deep and quiet   Iris Round and dilated Round and dilated   Lens Clear, no lenticonus Clear, no lenticonus  Vitreous Mild Vitreous syneresis, mild cell/pigment Mild Vitreous syneresis, mild cell/pigment       Fundus Exam      Right Left   Disc blurred margins, +edema Compact, blurred margins 270 sparring temporal quadrant, partial edema   C/D Ratio 0.1 0.1   Macula Bullous elevation / serous RD, +fleck retinopathy vs Dalen-Fuchs lesions most prominent temporal macula Flat, Blunted foveal reflex, scattered pockets of shallow SRF, +fleck retinopathy vs Dalen Fuchs lesions temporal and inferior macula   Vessels Vascular attenuation, Tortuous Vascular attenuation, Tortuous   Periphery Attached temporally, superiorly and nasally, +bullous SRF inferiorly w/ lattice and atrophic retina within area of detachment, patch of pigmented lattice at 1000 Attached except inferiorly, +SRF inferiorly with lattice degeneration / atrophic retina, fleck retinopathy vs Dalen-Fuchs lesions        Refraction    Wearing Rx      Sphere Cylinder Axis   Right -1.25 +2.75 094   Left +0.00 +1.50 084       Manifest Refraction      Sphere Cylinder Axis Dist VA   Right NI +/-3.00      Left -0.50 +1.50 085 20/25-2          IMAGING AND PROCEDURES  Imaging and Procedures for @TODAY @  OCT, Retina - OU - Both Eyes       Right Eye Quality was good. Progression has no prior data. Findings include subretinal fluid, intraretinal fluid, abnormal foveal contour, subretinal hyper-reflective material (Shallow pockets of SRF; bullous central SRF; disc edema).   Left Eye Quality was good. Central Foveal Thickness: 380. Progression has no prior data. Findings include abnormal foveal contour, subretinal fluid, no IRF, subretinal  hyper-reflective material (Shallow pockets of SRF; disc edema).   Notes *Images captured and stored on drive  Diagnosis / Impression:  OD: pockets of SRF with patches of SRHM; extremely bullous central IRF; +disc edema OS: Shallow pockets of SRF w/ patches of SRHM; +disc edema  Clinical management:  See below  Abbreviations: NFP - Normal foveal profile. CME - cystoid macular edema. PED - pigment epithelial detachment. IRF - intraretinal fluid. SRF - subretinal fluid. EZ - ellipsoid zone. ERM - epiretinal membrane. ORA - outer retinal atrophy. ORT - outer retinal tubulation. SRHM - subretinal hyper-reflective material        Fluorescein Angiography Optos (Transit OD)       Right Eye   Progression has no prior data. Early phase findings include leakage, staining, pooling. Mid/Late phase findings include staining, leakage, pooling.   Left Eye   Progression has no prior data. Early phase findings include leakage, staining. Mid/Late phase findings include leakage, staining.   Notes **Images stored on drive**  Impression: OD: multifocal hyperfluorescent staining and leakage; late hyperfluorescent pooling centrally; hyperfluorescence of disc  OS: multifocal hyperfluorescent staining and leakage; hyperfluorescence of disc                   ASSESSMENT/PLAN:    ICD-10-CM   1. Serous retinal detachment of both eyes  H33.23 Fluorescein Angiography Optos (Transit OD)  2. Retinal edema  H35.81 OCT, Retina - OU - Both Eyes  3. Alport syndrome  Q87.81   4. ESRD (end stage renal disease) on dialysis ([place])  N18.6    Z99.2   5. Hypertensive retinopathy of both eyes  H35.033 Fluorescein Angiography Optos (Transit OD)    1,2. Bilateral Serous Detachment (OD>OS)  - pt reports approx 4 day of history with central vision loss OD  -  first noted while rubbing eye  - BCVA CF@4ft  eccentric, OS: 20/30  - OCT shows +SRF with bullous central IRF OD; OS with scattered patches of shallow  SRF; +disc edema OU  - FA (09.17.20) shows multifocal hyperfluorescent staining and leakage (OD > OS); late hyperfluorescent pooling centrally OD; hyperfluorescence of disc OU  - ?Rhegmatogenous component OU -- +SRF inferiorly with atrophic retina / lattice degeneration  - discussed findings  - differential includes Harada's disease, Harada's-like disease, serous retinal detachment associated with Alport Syndrome and ESRD  - will refer to Dr. Manuella Ghazi, Retina/Uveitis expert at [facility] for evaluation and management and expert opinion  3,4. History of Alport's Syndrome with ESRD on dialysis  - history of kidney transplant 12.04.19, rejection and nephrectomy 8.11.2020  - now getting HD, MWF  - discussed case reports of bilateral serous RD in Alport's syndrome with severe kidney disease  - +fleck retinopathy vs Dalen-Fuchs nodules present on exam  - no lenticonus or other obvious anterior segment ocular abnormalities associated w/ Alport syndrome present on exam  5. Hypertensive retinopathy OU - BP 140s/110 in office today - pt reports labile Bps and anemia on HD - discussed importance of tight BP control   Ophthalmic Meds Ordered this visit:  No orders of the defined types were placed in this encounter.      Return in about 3 weeks (around 06/17/2019) for f/u serous RD OU, DFE, OCT.  There are no Patient Instructions on file for this visit.   Explained the diagnoses, plan, and follow up with the patient and they expressed understanding.  Patient expressed understanding of the importance of proper follow up care.   This document serves as a record of services personally performed by Gardiner Sleeper, MD, PhD. It was created on their behalf by Ernest Mallick, OA, an ophthalmic assistant. The creation of this record is the provider's dictation and/or activities during the visit.    Electronically signed by: Ernest Mallick, OA  09.17.2020 8:41 PM    Gardiner Sleeper, M.D., Ph.D. Diseases & Surgery  of the Retina and Vitreous [place]  I have reviewed the above documentation for accuracy and completeness, and I agree with the above. Gardiner Sleeper, M.D., Ph.D. 05/27/19 8:41 PM    Abbreviations: M myopia (nearsighted); A astigmatism; H hyperopia (farsighted); P presbyopia; Mrx spectacle prescription;  CTL contact lenses; OD right eye; OS left eye; OU both eyes  XT exotropia; ET esotropia; PEK punctate epithelial keratitis; PEE punctate epithelial erosions; DES dry eye syndrome; MGD meibomian gland dysfunction; ATs artificial tears; PFAT's preservative free artificial tears; [place] nuclear sclerotic cataract; PSC posterior subcapsular cataract; ERM epi-retinal membrane; PVD posterior vitreous detachment; RD retinal detachment; DM diabetes mellitus; DR diabetic retinopathy; NPDR non-proliferative diabetic retinopathy; PDR proliferative diabetic retinopathy; CSME clinically significant macular edema; DME diabetic macular edema; dbh dot blot hemorrhages; CWS cotton wool spot; POAG primary open angle glaucoma; C/D cup-to-disc ratio; HVF humphrey visual field; GVF goldmann visual field; OCT optical coherence tomography; IOP intraocular pressure; BRVO Branch retinal vein occlusion; CRVO central retinal vein occlusion; CRAO central retinal artery occlusion; BRAO branch retinal artery occlusion; RT retinal tear; SB scleral buckle; PPV pars plana vitrectomy; VH Vitreous hemorrhage; PRP panretinal laser photocoagulation; IVK intravitreal kenalog; VMT vitreomacular traction; MH Macular hole;  NVD neovascularization of the disc; NVE neovascularization elsewhere; AREDS age related eye disease study; ARMD age related macular degeneration; POAG primary open angle glaucoma; EBMD epithelial/anterior basement membrane dystrophy; ACIOL anterior chamber intraocular  lens; IOL intraocular lens; PCIOL posterior chamber intraocular lens; Phaco/IOL phacoemulsification with intraocular lens placement; [place]  photorefractive keratectomy; LASIK laser assisted in situ keratomileusis; HTN hypertension; DM diabetes mellitus; COPD chronic obstructive pulmonary disease

## 2019-05-27 NOTE — Telephone Encounter (Signed)
Sent patient a mychart message.

## 2019-06-04 ENCOUNTER — Ambulatory Visit: Payer: Medicare Other

## 2019-06-15 ENCOUNTER — Encounter (INDEPENDENT_AMBULATORY_CARE_PROVIDER_SITE_OTHER): Payer: Medicare Other | Admitting: Ophthalmology

## 2019-08-10 ENCOUNTER — Ambulatory Visit: Payer: Self-pay | Admitting: Surgery

## 2019-08-13 ENCOUNTER — Other Ambulatory Visit: Payer: Self-pay

## 2019-08-13 DIAGNOSIS — N181 Chronic kidney disease, stage 1: Secondary | ICD-10-CM

## 2019-08-16 ENCOUNTER — Telehealth (HOSPITAL_COMMUNITY): Payer: Self-pay | Admitting: *Deleted

## 2019-08-16 NOTE — Telephone Encounter (Signed)

## 2019-08-17 ENCOUNTER — Ambulatory Visit (HOSPITAL_COMMUNITY)
Admission: RE | Admit: 2019-08-17 | Discharge: 2019-08-17 | Disposition: A | Discharge status: Home / Self Care | Payer: Medicare Other | Source: Ambulatory Visit | Attending: Vascular Surgery | Admitting: Vascular Surgery

## 2019-08-17 ENCOUNTER — Encounter: Payer: Medicare Other | Admitting: Vascular Surgery

## 2019-08-17 ENCOUNTER — Ambulatory Visit (HOSPITAL_COMMUNITY): Payer: Medicare Other

## 2019-08-17 DIAGNOSIS — N181 Chronic kidney disease, stage 1: Secondary | ICD-10-CM

## 2019-08-23 ENCOUNTER — Observation Stay (HOSPITAL_BASED_OUTPATIENT_CLINIC_OR_DEPARTMENT_OTHER): Payer: Medicare Other

## 2019-08-23 ENCOUNTER — Inpatient Hospital Stay (HOSPITAL_COMMUNITY)
Admission: EM | Admit: 2019-08-23 | Discharge: 2019-08-24 | DRG: 177 | Discharge status: Against Medical Advice | Payer: Medicare Other | Attending: Internal Medicine | Admitting: Internal Medicine

## 2019-08-23 ENCOUNTER — Emergency Department (HOSPITAL_COMMUNITY): Payer: Medicare Other

## 2019-08-23 ENCOUNTER — Other Ambulatory Visit: Payer: Self-pay

## 2019-08-23 ENCOUNTER — Encounter (HOSPITAL_COMMUNITY): Payer: Self-pay | Admitting: Emergency Medicine

## 2019-08-23 ENCOUNTER — Observation Stay (HOSPITAL_COMMUNITY): Payer: Medicare Other

## 2019-08-23 DIAGNOSIS — U071 COVID-19: Secondary | ICD-10-CM | POA: Diagnosis not present

## 2019-08-23 DIAGNOSIS — R7989 Other specified abnormal findings of blood chemistry: Secondary | ICD-10-CM | POA: Diagnosis present

## 2019-08-23 DIAGNOSIS — D849 Immunodeficiency, unspecified: Secondary | ICD-10-CM | POA: Diagnosis present

## 2019-08-23 DIAGNOSIS — I132 Hypertensive heart and chronic kidney disease with heart failure and with stage 5 chronic kidney disease, or end stage renal disease: Secondary | ICD-10-CM | POA: Diagnosis present

## 2019-08-23 DIAGNOSIS — I371 Nonrheumatic pulmonary valve insufficiency: Secondary | ICD-10-CM | POA: Diagnosis not present

## 2019-08-23 DIAGNOSIS — R778 Other specified abnormalities of plasma proteins: Secondary | ICD-10-CM | POA: Diagnosis present

## 2019-08-23 DIAGNOSIS — I161 Hypertensive emergency: Secondary | ICD-10-CM

## 2019-08-23 DIAGNOSIS — Y830 Surgical operation with transplant of whole organ as the cause of abnormal reaction of the patient, or of later complication, without mention of misadventure at the time of the procedure: Secondary | ICD-10-CM | POA: Diagnosis present

## 2019-08-23 DIAGNOSIS — D631 Anemia in chronic kidney disease: Secondary | ICD-10-CM | POA: Diagnosis present

## 2019-08-23 DIAGNOSIS — I420 Dilated cardiomyopathy: Secondary | ICD-10-CM

## 2019-08-23 DIAGNOSIS — N186 End stage renal disease: Secondary | ICD-10-CM

## 2019-08-23 DIAGNOSIS — I313 Pericardial effusion (noninflammatory): Secondary | ICD-10-CM | POA: Diagnosis present

## 2019-08-23 DIAGNOSIS — R0600 Dyspnea, unspecified: Secondary | ICD-10-CM | POA: Diagnosis not present

## 2019-08-23 DIAGNOSIS — R000 Tachycardia, unspecified: Secondary | ICD-10-CM | POA: Diagnosis present

## 2019-08-23 DIAGNOSIS — N179 Acute kidney failure, unspecified: Secondary | ICD-10-CM

## 2019-08-23 DIAGNOSIS — Z992 Dependence on renal dialysis: Secondary | ICD-10-CM

## 2019-08-23 DIAGNOSIS — N2581 Secondary hyperparathyroidism of renal origin: Secondary | ICD-10-CM | POA: Diagnosis present

## 2019-08-23 DIAGNOSIS — Q8781 Alport syndrome: Secondary | ICD-10-CM

## 2019-08-23 DIAGNOSIS — T8612 Kidney transplant failure: Secondary | ICD-10-CM | POA: Diagnosis present

## 2019-08-23 DIAGNOSIS — I10 Essential (primary) hypertension: Secondary | ICD-10-CM | POA: Diagnosis present

## 2019-08-23 DIAGNOSIS — N189 Chronic kidney disease, unspecified: Secondary | ICD-10-CM

## 2019-08-23 DIAGNOSIS — I340 Nonrheumatic mitral (valve) insufficiency: Secondary | ICD-10-CM | POA: Diagnosis not present

## 2019-08-23 DIAGNOSIS — E8729 Other acidosis: Secondary | ICD-10-CM

## 2019-08-23 DIAGNOSIS — J1289 Other viral pneumonia: Secondary | ICD-10-CM | POA: Diagnosis present

## 2019-08-23 DIAGNOSIS — I429 Cardiomyopathy, unspecified: Secondary | ICD-10-CM | POA: Diagnosis present

## 2019-08-23 DIAGNOSIS — R0602 Shortness of breath: Secondary | ICD-10-CM | POA: Diagnosis present

## 2019-08-23 DIAGNOSIS — Z79899 Other long term (current) drug therapy: Secondary | ICD-10-CM

## 2019-08-23 DIAGNOSIS — E872 Acidosis: Secondary | ICD-10-CM | POA: Diagnosis present

## 2019-08-23 DIAGNOSIS — I5021 Acute systolic (congestive) heart failure: Secondary | ICD-10-CM | POA: Diagnosis present

## 2019-08-23 DIAGNOSIS — Z888 Allergy status to other drugs, medicaments and biological substances status: Secondary | ICD-10-CM

## 2019-08-23 DIAGNOSIS — Z793 Long term (current) use of hormonal contraceptives: Secondary | ICD-10-CM

## 2019-08-23 DIAGNOSIS — Z905 Acquired absence of kidney: Secondary | ICD-10-CM

## 2019-08-23 DIAGNOSIS — E875 Hyperkalemia: Secondary | ICD-10-CM | POA: Diagnosis present

## 2019-08-23 DIAGNOSIS — Z9114 Patient's other noncompliance with medication regimen: Secondary | ICD-10-CM

## 2019-08-23 DIAGNOSIS — Z5329 Procedure and treatment not carried out because of patient's decision for other reasons: Secondary | ICD-10-CM | POA: Diagnosis present

## 2019-08-23 LAB — HEPATITIS B SURFACE ANTIGEN: Hepatitis B Surface Ag: NONREACTIVE

## 2019-08-23 LAB — POCT I-STAT EG7
Acid-base deficit: 8 mmol/L — ABNORMAL HIGH (ref 0.0–2.0)
Bicarbonate: 15.7 mmol/L — ABNORMAL LOW (ref 20.0–28.0)
Calcium, Ion: 1.1 mmol/L — ABNORMAL LOW (ref 1.15–1.40)
HCT: 34 % — ABNORMAL LOW (ref 36.0–46.0)
Hemoglobin: 11.6 g/dL — ABNORMAL LOW (ref 12.0–15.0)
O2 Saturation: 55 %
Potassium: 5.1 mmol/L (ref 3.5–5.1)
Sodium: 133 mmol/L — ABNORMAL LOW (ref 135–145)
TCO2: 16 mmol/L — ABNORMAL LOW (ref 22–32)
pCO2, Ven: 27 mmHg — ABNORMAL LOW (ref 44.0–60.0)
pH, Ven: 7.372 (ref 7.250–7.430)
pO2, Ven: 29 mmHg — CL (ref 32.0–45.0)

## 2019-08-23 LAB — FERRITIN: Ferritin: 347 ng/mL — ABNORMAL HIGH (ref 11–307)

## 2019-08-23 LAB — BASIC METABOLIC PANEL
Anion gap: 25 — ABNORMAL HIGH (ref 5–15)
BUN: 49 mg/dL — ABNORMAL HIGH (ref 6–20)
CO2: 14 mmol/L — ABNORMAL LOW (ref 22–32)
Calcium: 9.9 mg/dL (ref 8.9–10.3)
Chloride: 99 mmol/L (ref 98–111)
Creatinine, Ser: 10.36 mg/dL — ABNORMAL HIGH (ref 0.44–1.00)
GFR calc Af Amer: 6 mL/min — ABNORMAL LOW (ref >60)
GFR calc non Af Amer: 5 mL/min — ABNORMAL LOW (ref >60)
Glucose, Bld: 77 mg/dL (ref 70–99)
Potassium: 5.3 mmol/L — ABNORMAL HIGH (ref 3.5–5.1)
Sodium: 138 mmol/L (ref 135–145)

## 2019-08-23 LAB — COMPREHENSIVE METABOLIC PANEL
ALT: 37 U/L (ref 0–44)
AST: 47 U/L — ABNORMAL HIGH (ref 15–41)
Albumin: 3.7 g/dL (ref 3.5–5.0)
Alkaline Phosphatase: 137 U/L — ABNORMAL HIGH (ref 38–126)
Anion gap: 29 — ABNORMAL HIGH (ref 5–15)
BUN: 51 mg/dL — ABNORMAL HIGH (ref 6–20)
CO2: 11 mmol/L — ABNORMAL LOW (ref 22–32)
Calcium: 10.1 mg/dL (ref 8.9–10.3)
Chloride: 96 mmol/L — ABNORMAL LOW (ref 98–111)
Creatinine, Ser: 10.72 mg/dL — ABNORMAL HIGH (ref 0.44–1.00)
GFR calc Af Amer: 5 mL/min — ABNORMAL LOW (ref >60)
GFR calc non Af Amer: 5 mL/min — ABNORMAL LOW (ref >60)
Glucose, Bld: 38 mg/dL — CL (ref 70–99)
Potassium: 5.8 mmol/L — ABNORMAL HIGH (ref 3.5–5.1)
Sodium: 136 mmol/L (ref 135–145)
Total Bilirubin: 2.8 mg/dL — ABNORMAL HIGH (ref 0.3–1.2)
Total Protein: 7 g/dL (ref 6.5–8.1)

## 2019-08-23 LAB — PROCALCITONIN: Procalcitonin: 1.99 ng/mL

## 2019-08-23 LAB — I-STAT BETA HCG BLOOD, ED (MC, WL, AP ONLY): I-stat hCG, quantitative: <5 m[IU]/mL (ref <5)

## 2019-08-23 LAB — CBC
HCT: 37.1 % (ref 36.0–46.0)
HCT: 38.1 % (ref 36.0–46.0)
Hemoglobin: 11.3 g/dL — ABNORMAL LOW (ref 12.0–15.0)
Hemoglobin: 11.4 g/dL — ABNORMAL LOW (ref 12.0–15.0)
MCH: 29.4 pg (ref 26.0–34.0)
MCH: 29.2 pg (ref 26.0–34.0)
MCHC: 30.5 g/dL (ref 30.0–36.0)
MCHC: 29.9 g/dL — ABNORMAL LOW (ref 30.0–36.0)
MCV: 96.4 fL (ref 80.0–100.0)
MCV: 97.4 fL (ref 80.0–100.0)
Platelets: 132 10*3/uL — ABNORMAL LOW (ref 150–400)
Platelets: 147 10*3/uL — ABNORMAL LOW (ref 150–400)
RBC: 3.85 MIL/uL — ABNORMAL LOW (ref 3.87–5.11)
RBC: 3.91 MIL/uL (ref 3.87–5.11)
RDW: 17.8 % — ABNORMAL HIGH (ref 11.5–15.5)
RDW: 18 % — ABNORMAL HIGH (ref 11.5–15.5)
WBC: 5.5 10*3/uL (ref 4.0–10.5)
WBC: 5.8 10*3/uL (ref 4.0–10.5)
nRBC: 0 % (ref 0.0–0.2)
nRBC: 0 % (ref 0.0–0.2)

## 2019-08-23 LAB — D-DIMER, QUANTITATIVE: D-Dimer, Quant: 3.72 ug/mL-FEU — ABNORMAL HIGH (ref 0.00–0.50)

## 2019-08-23 LAB — ECHOCARDIOGRAM COMPLETE

## 2019-08-23 LAB — LACTATE DEHYDROGENASE: LDH: 374 U/L — ABNORMAL HIGH (ref 98–192)

## 2019-08-23 LAB — HEMOGLOBIN A1C
Hgb A1c MFr Bld: 4.1 % — ABNORMAL LOW (ref 4.8–5.6)
Mean Plasma Glucose: 70.97 mg/dL

## 2019-08-23 LAB — C-REACTIVE PROTEIN: CRP: 4.5 mg/dL — ABNORMAL HIGH (ref <1.0)

## 2019-08-23 LAB — TROPONIN I (HIGH SENSITIVITY)
Troponin I (High Sensitivity): 72 ng/L — ABNORMAL HIGH (ref <18)
Troponin I (High Sensitivity): 79 ng/L — ABNORMAL HIGH (ref <18)

## 2019-08-23 LAB — FIBRINOGEN: Fibrinogen: 404 mg/dL (ref 210–475)

## 2019-08-23 LAB — HIV ANTIBODY (ROUTINE TESTING W REFLEX): HIV Screen 4th Generation wRfx: NONREACTIVE

## 2019-08-23 LAB — RESPIRATORY PANEL BY RT PCR (FLU A&B, COVID)
Influenza A by PCR: NEGATIVE
Influenza B by PCR: NEGATIVE
SARS Coronavirus 2 by RT PCR: POSITIVE — AB

## 2019-08-23 LAB — CBG MONITORING, ED
Glucose-Capillary: 17 mg/dL — CL (ref 70–99)
Glucose-Capillary: 90 mg/dL (ref 70–99)
Glucose-Capillary: 87 mg/dL (ref 70–99)
Glucose-Capillary: 97 mg/dL (ref 70–99)

## 2019-08-23 LAB — BRAIN NATRIURETIC PEPTIDE: B Natriuretic Peptide: >4500 pg/mL — ABNORMAL HIGH (ref 0.0–100.0)

## 2019-08-23 LAB — LACTIC ACID, PLASMA: Lactic Acid, Venous: 1.9 mmol/L (ref 0.5–1.9)

## 2019-08-23 MED ORDER — SODIUM CHLORIDE 0.9 % IV SOLN
1.0000 g | INTRAVENOUS | Status: DC
  Administered 2019-08-23: 1 g via INTRAVENOUS
  Filled 2019-08-23 (×2): qty 10

## 2019-08-23 MED ORDER — SEVELAMER CARBONATE 800 MG PO TABS
800.0000 mg | ORAL_TABLET | Freq: Three times a day (TID) | ORAL | Status: DC
  Administered 2019-08-23 – 2019-08-24 (×2): 800 mg via ORAL
  Filled 2019-08-23 (×4): qty 1

## 2019-08-23 MED ORDER — TACROLIMUS 4 MG PO TB24: 1.0000 mg | ORAL_TABLET | Freq: Every day | ORAL | Status: DC

## 2019-08-23 MED ORDER — LIDOCAINE-PRILOCAINE 2.5-2.5 % EX CREA
1.0000 "application " | TOPICAL_CREAM | CUTANEOUS | Status: DC | PRN
  Filled 2019-08-23: qty 5

## 2019-08-23 MED ORDER — PANTOPRAZOLE SODIUM 40 MG PO TBEC
40.0000 mg | DELAYED_RELEASE_TABLET | Freq: Every day | ORAL | Status: DC
  Administered 2019-08-23 – 2019-08-24 (×2): 40 mg via ORAL
  Filled 2019-08-23 (×2): qty 1

## 2019-08-23 MED ORDER — SODIUM BICARBONATE 8.4 % IV SOLN
50.0000 meq | Freq: Once | INTRAVENOUS | Status: AC
  Administered 2019-08-23: 50 meq via INTRAVENOUS
  Filled 2019-08-23: qty 50

## 2019-08-23 MED ORDER — SODIUM POLYSTYRENE SULFONATE 15 GM/60ML PO SUSP
30.0000 g | Freq: Once | ORAL | Status: AC
  Administered 2019-08-23: 30 g via ORAL
  Filled 2019-08-23: qty 120

## 2019-08-23 MED ORDER — SODIUM CHLORIDE 0.9% FLUSH: 3.0000 mL | INTRAVENOUS | Status: DC | PRN

## 2019-08-23 MED ORDER — ACETAMINOPHEN 650 MG RE SUPP: 650.0000 mg | Freq: Four times a day (QID) | RECTAL | Status: DC | PRN

## 2019-08-23 MED ORDER — HEPARIN SODIUM (PORCINE) 1000 UNIT/ML DIALYSIS
1000.0000 [IU] | INTRAMUSCULAR | Status: DC | PRN
  Filled 2019-08-23: qty 1

## 2019-08-23 MED ORDER — SODIUM CHLORIDE 0.9 % IV SOLN
500.0000 mg | INTRAVENOUS | Status: DC
  Administered 2019-08-23 – 2019-08-24 (×2): 500 mg via INTRAVENOUS
  Filled 2019-08-23 (×2): qty 500

## 2019-08-23 MED ORDER — ACETAMINOPHEN 325 MG PO TABS: 650.0000 mg | ORAL_TABLET | Freq: Four times a day (QID) | ORAL | Status: DC | PRN

## 2019-08-23 MED ORDER — FUROSEMIDE 20 MG PO TABS
40.0000 mg | ORAL_TABLET | Freq: Every day | ORAL | Status: DC
  Administered 2019-08-23: 40 mg via ORAL
  Filled 2019-08-23: qty 2

## 2019-08-23 MED ORDER — CALCIUM GLUCONATE-NACL 1-0.675 GM/50ML-% IV SOLN
1.0000 g | Freq: Once | INTRAVENOUS | Status: AC
  Administered 2019-08-23: 1000 mg via INTRAVENOUS
  Filled 2019-08-23: qty 50

## 2019-08-23 MED ORDER — CARVEDILOL 12.5 MG PO TABS
12.5000 mg | ORAL_TABLET | Freq: Two times a day (BID) | ORAL | Status: DC
  Administered 2019-08-23 – 2019-08-24 (×3): 12.5 mg via ORAL
  Filled 2019-08-23 (×3): qty 1

## 2019-08-23 MED ORDER — LIDOCAINE HCL (PF) 1 % IJ SOLN: 5.0000 mL | INTRAMUSCULAR | Status: DC | PRN

## 2019-08-23 MED ORDER — FUROSEMIDE 20 MG PO TABS
80.0000 mg | ORAL_TABLET | ORAL | Status: DC
  Filled 2019-08-23: qty 4

## 2019-08-23 MED ORDER — TACROLIMUS 4 MG PO TB24: 12.0000 mg | ORAL_TABLET | Freq: Every day | ORAL | Status: DC

## 2019-08-23 MED ORDER — DEXTROSE 50 % IV SOLN: 50.0000 mL | Freq: Once | INTRAVENOUS | Status: DC

## 2019-08-23 MED ORDER — PREDNISONE 5 MG PO TABS: 5.0000 mg | ORAL_TABLET | Freq: Every day | ORAL | Status: DC

## 2019-08-23 MED ORDER — LABETALOL HCL 5 MG/ML IV SOLN
10.0000 mg | Freq: Three times a day (TID) | INTRAVENOUS | Status: DC | PRN
  Administered 2019-08-23: 10 mg via INTRAVENOUS
  Filled 2019-08-23: qty 4

## 2019-08-23 MED ORDER — SODIUM CHLORIDE 0.9% FLUSH: 3.0000 mL | Freq: Two times a day (BID) | INTRAVENOUS | Status: DC

## 2019-08-23 MED ORDER — HYDRALAZINE HCL 10 MG PO TABS
10.0000 mg | ORAL_TABLET | Freq: Three times a day (TID) | ORAL | Status: DC
  Administered 2019-08-23 – 2019-08-24 (×3): 10 mg via ORAL
  Filled 2019-08-23 (×5): qty 1

## 2019-08-23 MED ORDER — SODIUM CHLORIDE 0.9 % IV SOLN
200.0000 mg | Freq: Once | INTRAVENOUS | Status: AC
  Administered 2019-08-23: 200 mg via INTRAVENOUS
  Filled 2019-08-23: qty 40

## 2019-08-23 MED ORDER — SULFAMETHOXAZOLE-TRIMETHOPRIM 400-80 MG PO TABS: 1.0000 | ORAL_TABLET | ORAL | Status: DC

## 2019-08-23 MED ORDER — SODIUM CHLORIDE 0.9 % IV SOLN
100.0000 mg | Freq: Every day | INTRAVENOUS | Status: DC
  Administered 2019-08-24: 100 mg via INTRAVENOUS
  Filled 2019-08-23: qty 20

## 2019-08-23 MED ORDER — DOXERCALCIFEROL 4 MCG/2ML IV SOLN: 2.0000 ug | INTRAVENOUS | Status: DC

## 2019-08-23 MED ORDER — VALGANCICLOVIR HCL 450 MG PO TABS: 450.0000 mg | ORAL_TABLET | Freq: Every day | ORAL | Status: DC

## 2019-08-23 MED ORDER — DEXTROSE 50 % IV SOLN
25.0000 g | INTRAVENOUS | Status: AC
  Administered 2019-08-23: 25 g via INTRAVENOUS
  Filled 2019-08-23: qty 50

## 2019-08-23 MED ORDER — SODIUM CHLORIDE 0.9 % IV SOLN: 100.0000 mL | INTRAVENOUS | Status: DC | PRN

## 2019-08-23 MED ORDER — ALTEPLASE 2 MG IJ SOLR: 2.0000 mg | Freq: Once | INTRAMUSCULAR | Status: DC | PRN

## 2019-08-23 MED ORDER — AMLODIPINE BESYLATE 5 MG PO TABS
10.0000 mg | ORAL_TABLET | Freq: Every day | ORAL | Status: DC
  Administered 2019-08-23: 10 mg via ORAL
  Filled 2019-08-23: qty 2

## 2019-08-23 MED ORDER — PENTAFLUOROPROP-TETRAFLUOROETH EX AERO
1.0000 "application " | INHALATION_SPRAY | CUTANEOUS | Status: DC | PRN
  Filled 2019-08-23: qty 30

## 2019-08-23 MED ORDER — SODIUM BICARBONATE 650 MG PO TABS: 650.0000 mg | ORAL_TABLET | Freq: Two times a day (BID) | ORAL | Status: DC

## 2019-08-23 MED ORDER — HYDRALAZINE HCL 20 MG/ML IJ SOLN
10.0000 mg | Freq: Four times a day (QID) | INTRAMUSCULAR | Status: DC | PRN
  Administered 2019-08-23: 10 mg via INTRAVENOUS
  Filled 2019-08-23: qty 1

## 2019-08-23 MED ORDER — SODIUM CHLORIDE 0.9 % IV SOLN: 250.0000 mL | INTRAVENOUS | Status: DC | PRN

## 2019-08-23 MED ORDER — DEXAMETHASONE SODIUM PHOSPHATE 10 MG/ML IJ SOLN
6.0000 mg | INTRAMUSCULAR | Status: DC
  Administered 2019-08-23 – 2019-08-24 (×2): 6 mg via INTRAVENOUS
  Filled 2019-08-23 (×2): qty 1

## 2019-08-23 MED ORDER — HEPARIN SODIUM (PORCINE) 5000 UNIT/ML IJ SOLN
5000.0000 [IU] | Freq: Three times a day (TID) | INTRAMUSCULAR | Status: DC
  Administered 2019-08-23 – 2019-08-24 (×4): 5000 [IU] via SUBCUTANEOUS
  Filled 2019-08-23 (×4): qty 1

## 2019-08-23 MED ORDER — SODIUM CHLORIDE 0.9% FLUSH
3.0000 mL | Freq: Once | INTRAVENOUS | Status: AC
  Administered 2019-08-23: 3 mL via INTRAVENOUS

## 2019-08-23 NOTE — ED Notes (Signed)
Pt given two warm blankets. Pt is resting.

## 2019-08-23 NOTE — ED Notes (Addendum)
Admitting notified of pt elevated D-dimer.Lost phone connection afterwards, no new orders.

## 2019-08-23 NOTE — Consult Note (Signed)
[facility] Renal Consultation Note  Requesting MD: Irene Pap, MD Indication for Consultation: End-stage renal disease  Chief complaint:  Shortness of breath  HPI:  Dominique Lawrence is a 18 y.o. female with a history of end-stage renal disease on dialysis Monday Wednesday Friday at [facility] via right IJ tunneled catheter and s/p failed renal transplant who presented to the hospital with shortness of breath.  She was diagnosed as Covid positive here in the ER.  She is being admitted to medicine.  The patient states that she has been attending dialysis treatment.  She states that she has been off of antihypertensives since August because she states it wasn't helping - has been on amlodipine at home.  She denies any dizziness or cramping.  Note that her dry weight was recently lowered by half a kilo as below for hypertension.  She was found to have a LVEF of 25 to 30% with a moderate pericardial effusion on TTE today.  Cardiology was consulted.  States that she takes one 4 mg tablet three times a day and she still takes the bactrim; she isn't taking valcyte anymore.  Not on prednisone.  Transplant in 08/2018 and has been on dialysis since 01/2019 she reports.   PMHx:   Past Medical History:  Diagnosis Date  . Alport syndrome   . Alport syndrome   . Constipation 12/18/2013  . Depression 07/28/2013  . Influenza B   . S/P wisdom tooth extraction 06/22/2016   Right mandibular dental pain    History reviewed. No pertinent surgical history.  Family Hx:  Family History  Problem Relation Age of Onset  . Alport syndrome Brother     Social History:  reports that she has never smoked. She has never used smokeless tobacco. She reports that she does not drink alcohol or use drugs.  Allergies:  Allergies  Allergen Reactions  . Enalapril Other (See Comments)    AKI  . Chlorhexidine Gluconate Hives  . Ibuprofen Other (See Comments)    Pt not able to take ibuprofen due to  Alport's syndrome    Medications: Prior to Admission medications   Medication Sig Start Date End Date Taking? Authorizing Provider  etonogestrel (NEXPLANON) 68 MG IMPL implant 68 mg by Subdermal route once. Jan 2018   Yes [provider]  sevelamer carbonate (RENVELA) 800 MG tablet Take 800 mg by mouth 3 (three) times daily with meals.   Yes [provider]  Tacrolimus 4 MG TB24 Take 12 mg by mouth daily. 02/08/19  Yes [provider]  amLODipine (NORVASC) 10 MG tablet Take 10 mg by mouth daily.    [provider]    I have reviewed the patient's current medications.  Labs:  BMP Latest Ref Rng & Units 08/23/2019 08/23/2019 08/23/2019  Glucose 70 - 99 mg/dL - 38(LL) 77  BUN 6 - 20 mg/dL - 51(H) 49(H)  Creatinine 0.44 - 1.00 mg/dL - 10.72(H) 10.36(H)  BUN/Creat Ratio 6 - 22 (calc) - - -  Sodium 135 - 145 mmol/L 133(L) 136 138  Potassium 3.5 - 5.1 mmol/L 5.1 5.8(H) 5.3(H)  Chloride 98 - 111 mmol/L - 96(L) 99  CO2 22 - 32 mmol/L - 11(L) 14(L)  Calcium 8.9 - 10.3 mg/dL - 10.1 9.9    Urinalysis    Component Value Date/Time   COLORURINE STRAW (A) 02/26/2019 1404   APPEARANCEUR CLEAR 02/26/2019 1404   LABSPEC 1.008 02/26/2019 1404   PHURINE 9.0 (H) 02/26/2019 1404   GLUCOSEU  50 (A) 02/26/2019 1404   HGBUR SMALL (A) 02/26/2019 1404   BILIRUBINUR NEGATIVE 02/26/2019 1404   BILIRUBINUR neg 08/11/2017 1139   KETONESUR NEGATIVE 02/26/2019 1404   PROTEINUR 100 (A) 02/26/2019 1404   UROBILINOGEN negative (A) 08/11/2017 1139   UROBILINOGEN 0.2 02/26/2015 1600   NITRITE NEGATIVE 02/26/2019 1404   LEUKOCYTESUR SMALL (A) 02/26/2019 1404     ROS:  Pertinent items noted in HPI and remainder of comprehensive ROS otherwise negative.  Physical Exam: Vitals:   08/23/19 1200 08/23/19 1215  BP: (!) 160/125 (!) 154/110  Pulse:    Resp: 18 16  SpO2:       General: Young adult female seated inno acute distress  Normal HEENT: Normocephalic  atraumatic Extraocular movements intact sclera anicteric Neck: Trachea midline neck supple Heart: S1-S2 no rubs appreciated Lungs: Clear to auscultation normal work of breathing on room air Abdomen: Soft nontender nondistended normal bowel sounds Extremities: No edema cyanosis or clubbing Skin: No rash on extremities exposed Neuro: Alert and oriented x3 provides a history follows commands Access right IJ tunneled catheter  Outpatient HD orders:   3 hours and 45 minutes  180 dialyzer BF 350  DF auto 1.5 EDW 58.5 kg  2k/2.5 calcium bath  Tunneled catheter Just lowered from 59 to 58.5 kg  hectorol 2 mcg with HD No ESA ordered - got mircera 225 on 11/30; hb on 12/9 was 10.0   Assessment/Plan:  # End-stage renal disease on hemodialysis - HD per Monday Wednesday Friday schedule with dialysis orders in for today - for now lasix 80 mg PO on non-HD days  # COVID-19 infection Therapies per primary team Optimize volume status  # Acute systolic CHF - Cardiology has been consulted - Optimize volume status with UF.  # pericardial effusion  - UF with HD   # Hypertension - States off of meds at home - Volume removal with dialysis today  # Immunosuppressed - s/p failed renal transplant - on tacrolimus - listed as 12 mg daily however per transplant note is on 1 mg XR daily  - valcyte was stopped per transplant note 05/2019 - have contacted transplant given marked discrepancy in her regimen - discontinued for now as she has not yet received   # Secondary hyperparathyroidism  - on hectorol and renvela   Claudia Desanctis 08/23/2019, 1:07 PM

## 2019-08-23 NOTE — Progress Notes (Signed)
18 year old female with history of Alport syndrome status post failed renal transplant now ESRD on HD Monday Wednesday Friday who presented to [facility] ED with complaints of dyspnea for 3-day duration.  Associated with nonproductive cough.  She denies any chest pain, abdominal pain, nausea or diarrhea at the time of this visit.  In the ED chest x-ray shows moderate cardiomegaly with hazy lung base opacities possibly atelectasis versus infiltrates.  Mildly elevated troponin.  Positive Covid test on 08/23/2019.  Inflammatory markers elevated, procalcitonin also elevated.  Will obtain blood cultures x2 peripherally and start IV remdesivir along with IV antibiotics empirically.  2D echo completed on 08/23/2019 showed LVEF 25 to 30% with moderate pericardial effusion which is a new diagnosis for this 18 year old female.   We appreciate Dr. Harrington Challenger prompt reading of her 2D echo.   Cardiology has been consulted to assist in the management of her newly diagnosed acute systolic CHF. Nephrology has been consulted for hemodialysis.  Discussed with Dr. Royce Macadamia, plan HD today.  Please refer to H&P dictated by my partner Dr. Maudie Mercury on 08/23/2019 for further details of the assessment and plan.  Patient is currently not appropriate for discharge at this time due to ongoing cardiac evaluation and treatment for COVID-19 viral pneumonia.  Patient will require at least 2 midnights for further evaluation and treatment of her present condition.

## 2019-08-23 NOTE — Progress Notes (Signed)
Renal Navigator notes patient's positive COVID 19 test result and notified [facility]. Patient's OP HD schedule will be TTS 12:00pm. She needs to arrive to the clinic at 11:45am and wait in the parking lot to be called in by staff. Per Education officer, museum at [facility] home [facility], patient's family will provide transportation and have been notified of temporary change in clinic while patient is positive for COVID.   [facility] [address] [place], [place]  Alphonzo Cruise, [place] Renal Navigator [phone]

## 2019-08-23 NOTE — ED Triage Notes (Signed)
Pt c/o chest pain and shortness of breath x 1 day. Pt reports symptoms are worse when lying flat. Dialysis MWF, last treatment on Friday.

## 2019-08-23 NOTE — ED Notes (Addendum)
Patient stated she would like to wait until the morning to drink the kayexalate.

## 2019-08-23 NOTE — ED Notes (Signed)
Pt is eating soup that her mom brought her

## 2019-08-23 NOTE — Progress Notes (Signed)
  Echocardiogram 2D Echocardiogram has been performed.  Jennette Dubin 08/23/2019, 9:48 AM

## 2019-08-23 NOTE — ED Provider Notes (Addendum)
[facility] EMERGENCY DEPARTMENT Provider Note   CSN: [id number] Arrival date & time: 08/23/19  0051     History Chief Complaint  Patient presents with  . Shortness of Breath    [facility] [name] is a 18 y.o. female.   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  She began to notice shortness of breath on Saturday which worsened into Sunday.  Shortness of breath worsened with exertion as well as when lying flat.  When she feels increasingly winded she notices a discomfort in her chest, but denies any other chest pain at rest.  Had some anorexia, N/V on Saturday which has improved.  Denies fevers, abdominal pain, hemoptysis, syncope, leg swelling.  Has been compliant with her medications and is still taking Tacrolimus. Previously received most of her care through [facility] Transplant service, but states she was released by them this summer pending her "levels" improving (?creatinine).   Nephrology - Dr. Pearson Grippe Last dialyzed for 4 hours on Friday  The history is provided by the patient. No language interpreter was used.  Shortness of Breath      Past Medical History:  Diagnosis Date  . Alport syndrome   . Alport syndrome   . Constipation 12/18/2013  . Depression 07/28/2013  . Influenza B   . S/P wisdom tooth extraction 06/22/2016   Right mandibular dental pain    Patient Active Problem List   Diagnosis Date Noted  . Severe anemia 02/26/2019  . Nexplanon in place 11/23/2017  . Avitaminosis D 12/23/2015  . Acne vulgaris 09/25/2015  . Kidney cysts 09/13/2015  . Renal insufficiency 09/12/2015  . Constipation 07/07/2015  . Hearing loss 03/01/2015  . Failed vision screen 09/14/2014  . Chronic kidney disease (CKD) stage G1/A1, glomerular filtration rate (GFR) equal to or greater than  90 mL/min/1.73 square meter and albuminuria creatinine ratio less than 30 mg/g 11/13/2013  . 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  . 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 Overweight, pediatric, BMI 85.0-94.9 percentile for age 13/19/2014    History reviewed. No pertinent surgical history.   OB History   No obstetric history on file.     Family History  Problem Relation Age of Onset  . Alport syndrome Brother     Social History   Tobacco Use  . Smoking status: Never Smoker  . Smokeless tobacco: Never Used  Substance Use Topics  . Alcohol use: No  . Drug use: No    Home Medications Prior to Admission medications   Medication Sig Start Date End Date Taking? Authorizing Provider  allopurinol (ZYLOPRIM) 100 MG tablet Take 2 tablets (200 mg total) by mouth daily. Patient not taking: Reported on 02/26/2019 04/23/17   Caroline More, DO  amLODipine (NORVASC) 10 MG tablet Take 10 mg by mouth daily.    [provider]  calcitRIOL (ROCALTROL) 0.5 MCG capsule Take 1 capsule (0.5 mcg total) by mouth daily. Patient not taking: Reported on 02/26/2019 04/22/17   Caroline More, DO  etonogestrel (NEXPLANON) 68 MG IMPL implant 68 mg by Subdermal route once. Jan 2018    [provider]  furosemide (LASIX) 40 MG tablet Take 40 mg by mouth daily.    [provider]  omeprazole (PRILOSEC) 20 MG capsule Take 20 mg by mouth daily.    [provider]  ondansetron [facility]  ODT) 4 MG disintegrating tablet Take 1 tablet (4 mg total) by mouth every 8 (eight) hours as needed for nausea or vomiting. Patient not taking: Reported on 02/26/2019 10/23/17   Recardo Evangelist, PA-C  predniSONE (DELTASONE) 5 MG tablet Take 5 mg by mouth daily with breakfast.    [provider]  sevelamer carbonate (RENVELA) 800 MG tablet Take 800 mg by mouth 3 (three) times daily with meals.    [provider]  sodium bicarbonate 650 MG tablet Take 1 tablet (650 mg total) by  mouth 2 (two) times daily. Patient not taking: Reported on 02/26/2019 04/22/17   Caroline More, DO  sulfamethoxazole-trimethoprim (BACTRIM) 400-80 MG tablet Take 1 tablet by mouth every Monday, Wednesday, and Friday.    [provider]  Tacrolimus 4 MG TB24 Take 12 mg by mouth daily. 02/08/19   [provider]  valGANciclovir (VALCYTE) 450 MG tablet Take 450 mg by mouth daily.    [provider]    Allergies    Enalapril, Chlorhexidine gluconate, and Ibuprofen  Review of Systems   Review of Systems  Respiratory: Positive for shortness of breath.   Ten systems reviewed and are negative for acute change, except as noted in the HPI.    Physical Exam Updated Vital Signs BP (!) 183/141   Pulse (!) 113   Resp (!) 28   SpO2 100%   Physical Exam Vitals and nursing note reviewed.  Constitutional:      General: She is not in acute distress.    Appearance: She is well-developed. She is not diaphoretic.     Comments: Appears ill, pale.  HENT:     Head: Normocephalic and atraumatic.  Eyes:     General: No scleral icterus.    Conjunctiva/sclera: Conjunctivae normal.  Cardiovascular:     Rate and Rhythm: Regular rhythm. Tachycardia present.     Pulses: Normal pulses.  Pulmonary:     Effort: Pulmonary effort is normal. No respiratory distress.     Comments: Dyspneic with tachypnea.  Lungs grossly clear.  Satting 100% on room air. Abdominal:     General: There is no distension.  Musculoskeletal:        General: Normal range of motion.     Cervical back: Normal range of motion.     Comments: No significant lower extremity pitting edema  Skin:    General: Skin is warm and dry.     Coloration: Skin is not pale.     Findings: No erythema or rash.  Neurological:     Mental Status: She is alert and oriented to person, place, and time.     Coordination: Coordination normal.     Comments: Moving all extremities spontaneously  Psychiatric:        Behavior:  Behavior normal.     ED Results / Procedures / Treatments   Labs (all labs ordered are listed, but only abnormal results are displayed) Labs Reviewed  BASIC METABOLIC PANEL - Abnormal; Notable for the following components:      Result Value   Potassium 5.3 (*)    CO2 14 (*)    BUN 49 (*)    Creatinine, Ser 10.36 (*)    GFR calc non Af Amer 5 (*)    GFR calc Af Amer 6 (*)    Anion gap 25 (*)    All other components within normal limits  CBC - Abnormal; Notable for the following components:   RBC 3.85 (*)    Hemoglobin  11.3 (*)    RDW 17.8 (*)    Platelets 132 (*)    All other components within normal limits  POCT I-STAT EG7 - Abnormal; Notable for the following components:   pCO2, Ven 27.0 (*)    pO2, Ven 29.0 (*)    Bicarbonate 15.7 (*)    TCO2 16 (*)    Acid-base deficit 8.0 (*)    Sodium 133 (*)    Calcium, Ion 1.10 (*)    HCT 34.0 (*)    Hemoglobin 11.6 (*)    All other components within normal limits  TROPONIN I (HIGH SENSITIVITY) - Abnormal; Notable for the following components:   Troponin I (High Sensitivity) 72 (*)    All other components within normal limits  RESPIRATORY PANEL BY RT PCR (FLU A&B, COVID)  TACROLIMUS LEVEL  URINALYSIS, ROUTINE W REFLEX MICROSCOPIC  HIV ANTIBODY (ROUTINE TESTING W REFLEX)  CREATININE, SERUM  COMPREHENSIVE METABOLIC PANEL  CBC  I-STAT BETA HCG BLOOD, ED (MC, [facility], AP ONLY)  TROPONIN I (HIGH SENSITIVITY)    EKG EKG Interpretation  Date/Time:  Monday August 23 2019 01:04:47 EST Ventricular Rate:  112 PR Interval:  124 QRS Duration: 74 QT Interval:  350 QTC Calculation: 477 R Axis:   65 Text Interpretation: Sinus tachycardia Nonspecific T wave abnormality Abnormal ECG No significant change since last tracing Confirmed by Deno Etienne [phone]) on 08/23/2019 2:32:42 AM   Radiology DG Chest 2 View  Result Date: 08/23/2019 CLINICAL DATA:  Chest pain and shortness of breath. Dialysis patient. EXAM: CHEST - 2 VIEW COMPARISON:   None. FINDINGS: Right-sided dialysis catheter tip at the atrial caval junction. Moderate cardiomegaly. There are small bilateral pleural effusions and hazy lung base opacities. No convincing pulmonary edema. No pneumothorax. No acute osseous abnormalities are seen. IMPRESSION: 1. Moderate cardiomegaly. Small bilateral pleural effusions and hazy lung base opacities, likely atelectasis. 2.  Right-sided dialysis catheter tip at the atrial caval junction. Electronically Signed   By: Keith Rake M.D.   On: 08/23/2019 01:45    Procedures .Critical Care Performed by: Antonietta Breach, PA-C Authorized by: Antonietta Breach, PA-C   Critical care provider statement:    Critical care time (minutes):  45   Critical care was necessary to treat or prevent imminent or life-threatening deterioration of the following conditions:  Metabolic crisis and renal failure   Critical care was time spent personally by me on the following activities:  Discussions with consultants, evaluation of patient's response to treatment, examination of patient, ordering and performing treatments and interventions, ordering and review of laboratory studies, ordering and review of radiographic studies, pulse oximetry, re-evaluation of patient's condition, obtaining history from patient or surrogate and review of old charts   (including critical care time)  Medications Ordered in ED Medications  sodium chloride flush (NS) 0.9 % injection 3 mL (has no administration in time range)     ED Course  I have reviewed the triage vital signs and the nursing notes.  Pertinent labs & imaging results that were available during my care of the patient were reviewed by me and considered in my medical decision making (see chart for details).   3:34 AM  Case discussed with Dr. Jonnie Finner of nephrology regarding need for more urgent/emergent dialysis. Nephrology to follow. Plan for admission to internal medicine service, unassigned.  3:45 AM Dr. Maudie Mercury  of [facility] to assess in the ED for admission.     MDM Rules/Calculators/A&P   18 year old female with a history of Alport syndrome  presents to the emergency department for evaluation of shortness of breath and dyspnea on exertion, progressive over the past 48 hours.  While her medication list references Norvasc, she states she was taken off her blood pressure medication when released by her transplant center.  Evaluation today concerning for hypertensive urgency/emergency with most recent BP of 183/141.  Troponin elevated at 72, though suspect degree of chronic troponin elevation given history of CKD.  EKG today is largely unchanged from prior.  Chest x-ray with moderate cardiomegaly and small bilateral pleural effusions concerning for fluid overload and need for dialysis.  Despite her shortness of breath complaints, patient satting 100% on room air.  She is not in any distress.  Acute on chronic CKD likely contributing to elevated anion gap acidosis today.  Plan for hospital admission and urgent/emergent dialysis.  Dr. Maudie Mercury of [facility] to admit.  Dr. Jonnie Finner aware and nephrology will follow.  5:06 AM COVID positive on 4-plex screen.   Final Clinical Impression(s) / ED Diagnoses Final diagnoses:  Hypertensive emergency  Acute renal failure superimposed on chronic kidney disease, unspecified CKD stage, unspecified acute renal failure type ([place])  Metabolic acidosis, increased anion gap  COVID-19 virus infection    Rx / DC Orders ED Discharge Orders    None       Antonietta Breach, PA-C 08/23/19 [place], [place], DO 08/23/19 0452    Antonietta Breach, PA-C 08/23/19 0506    Deno Etienne, DO 08/23/19 0522

## 2019-08-23 NOTE — ED Notes (Signed)
RN called dialysis and they said it will be later this afternoon before she would go.

## 2019-08-23 NOTE — Consult Note (Signed)
CARDIOLOGY CONSULT NOTE       Patient ID: Dominique Lawrence MRN: [id number] DOB/AGE: 2001-02-11 18 y.o.  Admit date: 08/23/2019 Referring Physician: Kim,james Primary Physician: Carmie End, MD Primary Cardiologist: New Reason for Consultation: CHF  Principal Problem:   COVID-19 virus infection Active Problems:   Dyspnea   Tachycardia   Elevated troponin   Hypertension   ESRD (end stage renal disease) on dialysis [facility])   HPI:  18 y.o. [place] female originally from [place]. Admitted with dyspnea since Sunday Denies sick contacts loss of smell, diarrhea Has had cough. Noted in ER to be COVID positive. CXR with cardiomegaly and CHF. Echo reviewed with moderate pericardial effusion moderate MR/TR EF 25-30% no tamponade She had a renal transplant at [facility] a year ago that failed. Has been on dialysis since May. Still makes urine BP has been high at dialysis she normally gets it M/W/F.  Currently comfortable with no chest pain tachyhpnea. Has not had dialysis yet today She is immunocompromised on Tacrolimus.    ROS All other systems reviewed and negative except as noted above  Past Medical History:  Diagnosis Date  . Alport syndrome   . Alport syndrome   . Constipation 12/18/2013  . Depression 07/28/2013  . Influenza B   . S/P wisdom tooth extraction 06/22/2016   Right mandibular dental pain    Family History  Problem Relation Age of Onset  . Alport syndrome Brother     Social History   Socioeconomic History  . Marital status: Married    Spouse name: Not on file  . Number of children: Not on file  . Years of education: Not on file  . Highest education level: Not on file  Occupational History  . Not on file  Tobacco Use  . Smoking status: Never Smoker  . Smokeless tobacco: Never Used  Substance and Sexual Activity  . Alcohol use: No  . Drug use: No  . Sexual activity: Not Currently    Birth control/protection: Implant  Other Topics Concern  .  Not on file  Social History Narrative   Lives with Mom, Sister, & 2 Brothers. No pets at home. No one smokes.   Social Determinants of Health   Financial Resource Strain:   . Difficulty of Paying Living Expenses: Not on file  Food Insecurity:   . Worried About Charity fundraiser in the Last Year: Not on file  . Ran Out of Food in the Last Year: Not on file  Transportation Needs:   . Lack of Transportation (Medical): Not on file  . Lack of Transportation (Non-Medical): Not on file  Physical Activity:   . Days of Exercise per Week: Not on file  . Minutes of Exercise per Session: Not on file  Stress:   . Feeling of Stress : Not on file  Social Connections:   . Frequency of Communication with Friends and Family: Not on file  . Frequency of Social Gatherings with Friends and Family: Not on file  . Attends Religious Services: Not on file  . Active Member of Clubs or Organizations: Not on file  . Attends Archivist Meetings: Not on file  . Marital Status: Not on file  Intimate Partner Violence:   . Fear of Current or Ex-Partner: Not on file  . Emotionally Abused: Not on file  . Physically Abused: Not on file  . Sexually Abused: Not on file    History reviewed. No pertinent surgical history.   Marland Kitchen  amLODipine  10 mg Oral Daily  . carvedilol  12.5 mg Oral BID WC  . dexamethasone (DECADRON) injection  6 mg Intravenous Q24H  . furosemide  40 mg Oral Daily  . heparin  5,000 Units Subcutaneous Q8H  . pantoprazole  40 mg Oral Daily  . sevelamer carbonate  800 mg Oral TID WC  . sodium bicarbonate  650 mg Oral BID  . sodium chloride flush  3 mL Intravenous Q12H  . sulfamethoxazole-trimethoprim  1 tablet Oral Q M,W,F  . Tacrolimus  12 mg Oral Daily  . valGANciclovir  450 mg Oral Daily   . sodium chloride      Physical Exam:   Affect appropriate [place] female  HEENT: normal Neck supple with no adenopathy Right subclavian dialysis catheter  Lungs clear with no wheezing  and good diaphragmatic motion Heart:  S1/S2 no murmur, no rub, gallop or click PMI normal Abdomen: benighn, BS positve, no tenderness, no AAA no bruit.  No HSM or HJR transplanted kidney in LLQ Distal pulses intact with no bruits No edema Neuro non-focal Skin warm and dry No muscular weakness   Labs:   Lab Results  Component Value Date   WBC 5.8 08/23/2019   HGB 11.6 (L) 08/23/2019   HCT 34.0 (L) 08/23/2019   MCV 97.4 08/23/2019   PLT 147 (L) 08/23/2019    Recent Labs  Lab 08/23/19 0303 08/23/19 0346  NA 136 133*  K 5.8* 5.1  CL 96*  --   CO2 11*  --   BUN 51*  --   CREATININE 10.72*  --   CALCIUM 10.1  --   PROT 7.0  --   BILITOT 2.8*  --   ALKPHOS 137*  --   ALT 37  --   AST 47*  --   GLUCOSE 38*  --    No results found for: CKTOTAL, CKMB, CKMBINDEX, TROPONINI  Lab Results  Component Value Date   CHOL 157 01/06/2014   CHOL 190 (H) 07/28/2013   Lab Results  Component Value Date   HDL 38 01/06/2014   HDL 39 07/28/2013   Lab Results  Component Value Date   LDLCALC 74 01/06/2014   Lab Results  Component Value Date   TRIG 224 (H) 01/06/2014   Lab Results  Component Value Date   CHOLHDL 4.1 01/06/2014   No results found for: LDLDIRECT    Radiology: DG Chest 2 View  Result Date: 08/23/2019 CLINICAL DATA:  Chest pain and shortness of breath. Dialysis patient. EXAM: CHEST - 2 VIEW COMPARISON:  None. FINDINGS: Right-sided dialysis catheter tip at the atrial caval junction. Moderate cardiomegaly. There are small bilateral pleural effusions and hazy lung base opacities. No convincing pulmonary edema. No pneumothorax. No acute osseous abnormalities are seen. IMPRESSION: 1. Moderate cardiomegaly. Small bilateral pleural effusions and hazy lung base opacities, likely atelectasis. 2.  Right-sided dialysis catheter tip at the atrial caval junction. Electronically Signed   By: Keith Rake M.D.   On: 08/23/2019 01:45   ECHOCARDIOGRAM COMPLETE  Result Date:  08/23/2019   ECHOCARDIOGRAM REPORT   Patient Name:   Dominique Lawrence Date of Exam: 08/23/2019 Medical Rec #:  [id number]                     Height:       65.0 in Accession #:    [id number]  Weight:       163.6 lb Date of Birth:  2001/02/07                    BSA:          1.82 m Patient Age:    17 years                      BP:           178/133 mmHg Patient Gender: F                             HR:           101 bpm. Exam Location:  Inpatient Procedure: 2D Echo Indications:    Dyspnea R06.00  History:        Patient has no prior history of Echocardiogram examinations.                 Covid-19 Positive.  Sonographer:    Mikki Santee RDCS (AE) Referring Phys: [place]  1. Left ventricular ejection fraction, by visual estimation, is 25 to 30%. The left ventricle has severely decreased function. There is no left ventricular hypertrophy.  2. The left ventricle demonstrates global hypokinesis.  3. Global right ventricle has mildly reduced systolic function.The right ventricular size is normal. No increase in right ventricular wall thickness.  4. Left atrial size was severely dilated.  5. Right atrial size was normal.  6. Moderate pericardial effusion.  7. Pericardial effusion does not appear to be causing hemodynamic compromise by echo criteria. CLinical correlation indicated.  8. The mitral valve is normal in structure. Moderate mitral valve regurgitation.  9. The tricuspid valve is normal in structure. Tricuspid valve regurgitation is trivial. 10. The aortic valve is normal in structure. Aortic valve regurgitation is trivial. 11. Pulmonic regurgitation is mild. 12. The pulmonic valve was normal in structure. Pulmonic valve regurgitation is mild. 13. Moderately elevated pulmonary artery systolic pressure. 14. The inferior vena cava is normal in size with <50% respiratory variability, suggesting right atrial pressure of 8 mmHg. FINDINGS  Left Ventricle: Left  ventricular ejection fraction, by visual estimation, is 25 to 30%. The left ventricle has severely decreased function. The left ventricle demonstrates global hypokinesis. The left ventricular internal cavity size was the left ventricle is normal in size. There is no left ventricular hypertrophy. Right Ventricle: The right ventricular size is normal. No increase in right ventricular wall thickness. Global RV systolic function is has mildly reduced systolic function. The tricuspid regurgitant velocity is 3.00 m/s, and with an assumed right atrial pressure of 8 mmHg, the estimated right ventricular systolic pressure is moderately elevated at 44.1 mmHg. Left Atrium: Left atrial size was severely dilated. Right Atrium: Right atrial size was normal in size Pericardium: A moderately sized pericardial effusion is present. Pericardial effusion does not appear to be causing hemodynamic compromise by echo criteria. CLinical correlation indicated. Mitral Valve: The mitral valve is normal in structure. Moderate mitral valve regurgitation. Tricuspid Valve: The tricuspid valve is normal in structure. Tricuspid valve regurgitation is trivial. Aortic Valve: The aortic valve is normal in structure. Aortic valve regurgitation is trivial. Pulmonic Valve: The pulmonic valve was normal in structure. Pulmonic valve regurgitation is mild. Pulmonic regurgitation is mild. Aorta: The aortic root is normal in size and structure. Venous: The inferior vena cava is normal in size with less than 50% respiratory variability,  suggesting right atrial pressure of 8 mmHg. IAS/Shunts: No atrial level shunt detected by color flow Doppler.  LEFT VENTRICLE PLAX 2D LVIDd:         5.15 cm  Diastology LVIDs:         4.47 cm  LV e' lateral: 8.05 cm/s LV PW:         1.01 cm  LV e' medial:  5.22 cm/s LV IVS:        0.55 cm LVOT diam:     2.00 cm LV SV:         36 ml LV SV Index:   19.14 LVOT Area:     3.14 cm  RIGHT VENTRICLE TAPSE (M-mode): 1.2 cm LEFT ATRIUM              Index       RIGHT ATRIUM           Index LA diam:        4.60 cm 2.53 cm/m  RA Area:     12.20 cm LA Vol (A2C):   93.7 ml 51.61 ml/m RA Volume:   29.60 ml  16.30 ml/m LA Vol (A4C):   95.5 ml 52.60 ml/m LA Biplane Vol: 99.7 ml 54.92 ml/m  AORTIC VALVE LVOT Vmax:   70.80 cm/s LVOT Vmean:  43.700 cm/s LVOT VTI:    0.100 m  AORTA Ao Root diam: 3.00 cm MR Peak grad:    142.6 mmHg  TRICUSPID VALVE MR Mean grad:    93.0 mmHg   TR Peak grad:   36.1 mmHg MR Vmax:         597.00 cm/s TR Vmax:        324.00 cm/s MR Vmean:        445.0 cm/s MR PISA:         1.57 cm    SHUNTS MR PISA Eff ROA: 9 mm       Systemic VTI:  0.10 m MR PISA Radius:  0.50 cm     Systemic Diam: 2.00 cm  Dorris Carnes MD Electronically signed by Dorris Carnes MD Signature Date/Time: 08/23/2019/11:04:02 AM    Final     EKG: NSR no acute changes no changes of pericarditis    ASSESSMENT AND PLAN:   1. 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 Volume management per renal needs dialysis today Does not appear volume overloaded on exam. Normally only on 40 mg daily of lasix which was cut back to only during dialysis days. Will leave up to real but given Cr 10.7 would suggest that 80 iv bid would be better in setting of CHF with BNP >4500 Will also start hydralazine No ACE due to CRF At some point may benefit from cardiac MRI in regard to etiology of low EF / ? Myocarditis   2. COVID:  Rx aggressively given possible myocarditis and low EF Troponin is negative Decadron and remdesivir given   3. CRF:  Needs to see [facility] regarding fistula. Dialysis today draw more fluid off than usual given CHF and new diagnosis of systolic dysfunction   4. Pericardial Effusion: moderate no tamponade repeat echo in 2-3 weeks No pericardial changes on ECG presence suggests possible myocarditis but troponin only in 70's NSAI's and colchicine at primary service discretion   Signed: Jenkins Rouge 08/23/2019,  12:03 PM

## 2019-08-23 NOTE — ED Notes (Signed)
Admitting paged 

## 2019-08-23 NOTE — ED Notes (Signed)
bfast tray given

## 2019-08-23 NOTE — H&P (Signed)
TRH H&P    Patient Demographics:    Dominique Lawrence, is a 18 y.o. female  MRN: [id number]  DOB - 07/04/2001  Admit Date - 08/23/2019  Referring MD/NP/PA: Deno Etienne  Outpatient Primary MD for the patient is Ettefagh, Paul Dykes, MD  Patient coming from:  home  Chief complaint- dyspnea   HPI:    Dominique Lawrence  is a 18 y.o. 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  Pt denies fever, chiills cough, cp, palp, n/v, diarrhea, brbpr, black stool.   In ED   CXR IMPRESSION: 1. Moderate cardiomegaly. Small bilateral pleural effusions and hazy lung base opacities, likely atelectasis.2.  Right-sided dialysis catheter tip at the atrial caval junction.  T afebrile P 113, R 26, Bp 192/144  Pox 100% on RA  Wbc 5.5, Hgb 11.3, Plt 132  Na 138, K 5.3, Bun 49, Creatinine 10.36 Trop 72  Sars Covid positive  Pt will be admitted for dyspnea secondary to covid-19 infection, as well as hyperkalemia.         Review of systems:    In addition to the HPI above,  No Fever-chills, No Headache, No changes with Vision or hearing, No problems swallowing food or Liquids, No Chest pain, Cough   No Abdominal pain, No Nausea or Vomiting, bowel movements are regular, No Blood in stool or Urine, No dysuria, No new skin rashes or bruises, No new joints pains-aches,  No new weakness, tingling, numbness in any extremity, No recent weight gain or loss, No polyuria, polydypsia or polyphagia, No significant Mental Stressors.  All other systems reviewed and are negative.    Past History of the following :    Past Medical History:  Diagnosis Date  . Alport syndrome   . Alport syndrome   . Constipation 12/18/2013  . Depression 07/28/2013  . Influenza B   . S/P wisdom tooth extraction 06/22/2016   Right mandibular dental pain        History reviewed. No pertinent surgical history.    Social History:      Social History   Tobacco Use  . Smoking status: Never Smoker  . Smokeless tobacco: Never Used  Substance Use Topics  . Alcohol use: No       Family History :     Family History  Problem Relation Age of Onset  . Alport syndrome Brother        Home Medications:   Prior to Admission medications   Medication Sig Start Date End Date Taking? Authorizing Provider  allopurinol (ZYLOPRIM) 100 MG tablet Take 2 tablets (200 mg total) by mouth daily. Patient not taking: Reported on 02/26/2019 04/23/17   Caroline More, DO  amLODipine (NORVASC) 10 MG tablet Take 10 mg by mouth daily.    [provider]  calcitRIOL (ROCALTROL) 0.5 MCG capsule Take 1 capsule (0.5 mcg total) by mouth daily. Patient not taking: Reported on 02/26/2019 04/22/17   Caroline More, DO  etonogestrel (NEXPLANON) 68 MG  IMPL implant 68 mg by Subdermal route once. Jan 2018    [provider]  furosemide (LASIX) 40 MG tablet Take 40 mg by mouth daily.    [provider]  omeprazole (PRILOSEC) 20 MG capsule Take 20 mg by mouth daily.    [provider]  ondansetron (ZOFRAN ODT) 4 MG disintegrating tablet Take 1 tablet (4 mg total) by mouth every 8 (eight) hours as needed for nausea or vomiting. Patient not taking: Reported on 02/26/2019 10/23/17   Recardo Evangelist, PA-C  predniSONE (DELTASONE) 5 MG tablet Take 5 mg by mouth daily with breakfast.    [provider]  sevelamer carbonate (RENVELA) 800 MG tablet Take 800 mg by mouth 3 (three) times daily with meals.    [provider]  sodium bicarbonate 650 MG tablet Take 1 tablet (650 mg total) by mouth 2 (two) times daily. Patient not taking: Reported on 02/26/2019 04/22/17   Caroline More, DO  sulfamethoxazole-trimethoprim (BACTRIM) 400-80 MG tablet Take 1 tablet by mouth every Monday, Wednesday, and Friday.    [provider]   Tacrolimus 4 MG TB24 Take 12 mg by mouth daily. 02/08/19   [provider]  valGANciclovir (VALCYTE) 450 MG tablet Take 450 mg by mouth daily.    [provider]     Allergies:     Allergies  Allergen Reactions  . Enalapril Other (See Comments)    AKI  . Chlorhexidine Gluconate Hives  . Ibuprofen Other (See Comments)    Pt not able to take ibuprofen due to Alport's syndrome     Physical Exam:   Vitals  Blood pressure (!) 183/141, pulse (!) 113, resp. rate (!) 28, SpO2 100 %.  1.  General: axoxo3  2. Psychiatric: euthymic  3. Neurologic: nonfocal  4. HEENMT:  Anicteric, pupils 1.21mm symmetric, direct, consensual near intact Neck: no jvd  5. Respiratory : CTAB  6. Cardiovascular : rrr s1, s2, no m/g/r + dialysis catheter in the R upper chest  7. Gastrointestinal:  Abd: soft, nt, n,d +bs  8. Skin:  Ext: no c/c/e, no rash  9.Musculoskeletal:  Good ROM    Data Review:    CBC Recent Labs  Lab 08/23/19 0115 08/23/19 0346  WBC 5.5  --   HGB 11.3* 11.6*  HCT 37.1 34.0*  PLT 132*  --   MCV 96.4  --   MCH 29.4  --   MCHC 30.5  --   RDW 17.8*  --    ------------------------------------------------------------------------------------------------------------------  Results for orders placed or performed during the hospital encounter of 08/23/19 (from the past 48 hour(s))  Basic metabolic panel     Status: Abnormal   Collection Time: 08/23/19  1:15 AM  Result Value Ref Range   Sodium 138 135 - 145 mmol/L   Potassium 5.3 (H) 3.5 - 5.1 mmol/L   Chloride 99 98 - 111 mmol/L   CO2 14 (L) 22 - 32 mmol/L   Glucose, Bld 77 70 - 99 mg/dL   BUN 49 (H) 6 - 20 mg/dL   Creatinine, Ser 10.36 (H) 0.44 - 1.00 mg/dL   Calcium 9.9 8.9 - 10.3 mg/dL   GFR calc non Af Amer 5 (L) >60 mL/min   GFR calc Af Amer 6 (L) >60 mL/min   Anion gap 25 (H) 5 - 15    Comment: Performed at [facility], [address], [place], [place] [address]  CBC      Status: Abnormal  Collection Time: 08/23/19  1:15 AM  Result Value Ref Range   WBC 5.5 4.0 - 10.5 K/uL   RBC 3.85 (L) 3.87 - 5.11 MIL/uL   Hemoglobin 11.3 (L) 12.0 - 15.0 g/dL   HCT 37.1 36.0 - 46.0 %   MCV 96.4 80.0 - 100.0 fL   MCH 29.4 26.0 - 34.0 pg   MCHC 30.5 30.0 - 36.0 g/dL   RDW 17.8 (H) 11.5 - 15.5 %   Platelets 132 (L) 150 - 400 K/uL   nRBC 0.0 0.0 - 0.2 %    Comment: Performed at [facility] [address] [place], [place] [address]  Troponin I (High Sensitivity)     Status: Abnormal   Collection Time: 08/23/19  1:15 AM  Result Value Ref Range   Troponin I (High Sensitivity) 72 (H) <18 ng/L    Comment: (NOTE) Elevated high sensitivity troponin I (hsTnI) values and significant  changes across serial measurements may suggest ACS but many other  chronic and acute conditions are known to elevate hsTnI results.  Refer to the Links section for chest pain algorithms and additional  guidance. Performed at [facility], [place] [address], [place], [place] [address]   I-Stat beta hCG blood, ED     Status: None   Collection Time: 08/23/19  1:42 AM  Result Value Ref Range   I-stat hCG, quantitative <5.0 <5 mIU/mL   Comment 3            Comment:   GEST. AGE      CONC.  (mIU/mL)   <=1 WEEK        5 - 50     2 WEEKS       50 - 500     3 WEEKS       100 - 10,000     4 WEEKS     1,000 - 30,000        FEMALE AND NON-PREGNANT FEMALE:     LESS THAN 5 mIU/mL   POCT I-Stat EG7     Status: Abnormal   Collection Time: 08/23/19  3:46 AM  Result Value Ref Range   pH, Ven 7.372 7.250 - 7.430   pCO2, Ven 27.0 (L) 44.0 - 60.0 mmHg   pO2, Ven 29.0 (LL) 32.0 - 45.0 mmHg   Bicarbonate 15.7 (L) 20.0 - 28.0 mmol/L   TCO2 16 (L) 22 - 32 mmol/L   O2 Saturation 55.0 %   Acid-base deficit 8.0 (H) 0.0 - 2.0 mmol/L   Sodium 133 (L) 135 - 145 mmol/L   Potassium 5.1 3.5 - 5.1 mmol/L   Calcium, Ion 1.10 (L) 1.15 - 1.40 mmol/L   HCT 34.0 (L) 36.0 - 46.0 %   Hemoglobin 11.6 (L) 12.0  - 15.0 g/dL   Patient temperature HIDE    Collection site RADIAL, ALLEN'S TEST ACCEPTABLE    Sample type VENOUS    Comment NOTIFIED PHYSICIAN     Chemistries  Recent Labs  Lab 08/23/19 0115 08/23/19 0346  NA 138 133*  K 5.3* 5.1  CL 99  --   CO2 14*  --   GLUCOSE 77  --   BUN 49*  --   CREATININE 10.36*  --   CALCIUM 9.9  --    ------------------------------------------------------------------------------------------------------------------  ------------------------------------------------------------------------------------------------------------------ GFR: CrCl cannot be calculated (Unknown ideal weight.). Liver Function Tests: No results for input(s): AST, ALT, ALKPHOS, BILITOT, PROT, ALBUMIN in the last 168 hours. No results for input(s): LIPASE, AMYLASE  in the last 168 hours. No results for input(s): AMMONIA in the last 168 hours. Coagulation Profile: No results for input(s): INR, PROTIME in the last 168 hours. Cardiac Enzymes: No results for input(s): CKTOTAL, CKMB, CKMBINDEX, TROPONINI in the last 168 hours. BNP (last 3 results) No results for input(s): PROBNP in the last 8760 hours. HbA1C: No results for input(s): HGBA1C in the last 72 hours. CBG: No results for input(s): GLUCAP in the last 168 hours. Lipid Profile: No results for input(s): CHOL, HDL, LDLCALC, TRIG, CHOLHDL, LDLDIRECT in the last 72 hours. Thyroid Function Tests: No results for input(s): TSH, T4TOTAL, FREET4, T3FREE, THYROIDAB in the last 72 hours. Anemia Panel: No results for input(s): VITAMINB12, FOLATE, FERRITIN, TIBC, IRON, RETICCTPCT in the last 72 hours.  --------------------------------------------------------------------------------------------------------------- Urine analysis:    Component Value Date/Time   COLORURINE STRAW (A) 02/26/2019 1404   APPEARANCEUR CLEAR 02/26/2019 1404   LABSPEC 1.008 02/26/2019 1404   PHURINE 9.0 (H) 02/26/2019 1404   GLUCOSEU 50 (A) 02/26/2019  1404   HGBUR SMALL (A) 02/26/2019 1404   BILIRUBINUR NEGATIVE 02/26/2019 1404   BILIRUBINUR neg 08/11/2017 1139   KETONESUR NEGATIVE 02/26/2019 1404   PROTEINUR 100 (A) 02/26/2019 1404   UROBILINOGEN negative (A) 08/11/2017 1139   UROBILINOGEN 0.2 02/26/2015 1600   NITRITE NEGATIVE 02/26/2019 1404   LEUKOCYTESUR SMALL (A) 02/26/2019 1404      Imaging Results:    DG Chest 2 View  Result Date: 08/23/2019 CLINICAL DATA:  Chest pain and shortness of breath. Dialysis patient. EXAM: CHEST - 2 VIEW COMPARISON:  None. FINDINGS: Right-sided dialysis catheter tip at the atrial caval junction. Moderate cardiomegaly. There are small bilateral pleural effusions and hazy lung base opacities. No convincing pulmonary edema. No pneumothorax. No acute osseous abnormalities are seen. IMPRESSION: 1. Moderate cardiomegaly. Small bilateral pleural effusions and hazy lung base opacities, likely atelectasis. 2.  Right-sided dialysis catheter tip at the atrial caval junction. Electronically Signed   By: Keith Rake M.D.   On: 08/23/2019 01:45       Assessment & Plan:    Active Problems:   Dyspnea  Dyspnea secondary to covid -19, ESRD covid labs ordered and pending Dexamethasone 6mg  iv qday Consider remdesivir  ESRD on HD MWF Nephrology consulted by ED, appreciate input  Hyperkalemia Calcium gluconate Sodium bicarbonate Will have dialysis this AM Check cmp in am  Tachycardia, Elevated troponin VQ scan r/o PE Check cardiac echo  Hypertension uncontrolled Hydralazine 10mg  iv q6h prn  Pt doesn't like how amlodipine makes her feel ? Please try to wean off Start carvedilol 12.5mg  po bid  Renal transplant Cont Bactrim Cont Tacrolimus Pt states not taking prednisone, please verify her medications in AM  DVT Prophylaxis-   heparin- SCDs   AM Labs Ordered, also please review Full Orders  Family Communication: Admission, patients condition and plan of care including tests being ordered  have been discussed with the patient who indicate understanding and agree with the plan and Code Status.  Code Status:  FULL CODE per patient, notified mother that patient admitted to [facility]  Admission status: Observation: Based on patients clinical presentation and evaluation of above clinical data, I have made determination that patient meets observation criteria at this time.   Time spent in minutes : 55 minutes   Jani Gravel M.D on 08/23/2019 at 3:54 AM

## 2019-08-23 NOTE — ED Notes (Signed)
Dr. Maudie Mercury notified of pt hypoglycemia.

## 2019-08-23 NOTE — ED Notes (Signed)
PA and MD made aware of pt BP.

## 2019-08-23 NOTE — ED Notes (Signed)
Dr. Maudie Mercury notified of pt positive COVID status.

## 2019-08-23 NOTE — ED Notes (Signed)
Husband # [phone], contact once patient has a room.

## 2019-08-23 NOTE — ED Notes (Signed)
Breakfast Ordered 

## 2019-08-24 DIAGNOSIS — Y830 Surgical operation with transplant of whole organ as the cause of abnormal reaction of the patient, or of later complication, without mention of misadventure at the time of the procedure: Secondary | ICD-10-CM | POA: Diagnosis present

## 2019-08-24 DIAGNOSIS — Z888 Allergy status to other drugs, medicaments and biological substances status: Secondary | ICD-10-CM | POA: Diagnosis not present

## 2019-08-24 DIAGNOSIS — I313 Pericardial effusion (noninflammatory): Secondary | ICD-10-CM | POA: Diagnosis present

## 2019-08-24 DIAGNOSIS — R0602 Shortness of breath: Secondary | ICD-10-CM | POA: Diagnosis present

## 2019-08-24 DIAGNOSIS — Q8781 Alport syndrome: Secondary | ICD-10-CM | POA: Diagnosis not present

## 2019-08-24 DIAGNOSIS — Z5329 Procedure and treatment not carried out because of patient's decision for other reasons: Secondary | ICD-10-CM | POA: Diagnosis present

## 2019-08-24 DIAGNOSIS — T8612 Kidney transplant failure: Secondary | ICD-10-CM | POA: Diagnosis present

## 2019-08-24 DIAGNOSIS — J1289 Other viral pneumonia: Secondary | ICD-10-CM | POA: Diagnosis present

## 2019-08-24 DIAGNOSIS — U071 COVID-19: Secondary | ICD-10-CM | POA: Diagnosis not present

## 2019-08-24 DIAGNOSIS — I429 Cardiomyopathy, unspecified: Secondary | ICD-10-CM | POA: Diagnosis present

## 2019-08-24 DIAGNOSIS — Z79899 Other long term (current) drug therapy: Secondary | ICD-10-CM | POA: Diagnosis not present

## 2019-08-24 DIAGNOSIS — Z905 Acquired absence of kidney: Secondary | ICD-10-CM | POA: Diagnosis not present

## 2019-08-24 DIAGNOSIS — E875 Hyperkalemia: Secondary | ICD-10-CM | POA: Diagnosis present

## 2019-08-24 DIAGNOSIS — D631 Anemia in chronic kidney disease: Secondary | ICD-10-CM | POA: Diagnosis present

## 2019-08-24 DIAGNOSIS — I132 Hypertensive heart and chronic kidney disease with heart failure and with stage 5 chronic kidney disease, or end stage renal disease: Secondary | ICD-10-CM | POA: Diagnosis present

## 2019-08-24 DIAGNOSIS — I5021 Acute systolic (congestive) heart failure: Secondary | ICD-10-CM | POA: Diagnosis present

## 2019-08-24 DIAGNOSIS — Z793 Long term (current) use of hormonal contraceptives: Secondary | ICD-10-CM | POA: Diagnosis not present

## 2019-08-24 DIAGNOSIS — D849 Immunodeficiency, unspecified: Secondary | ICD-10-CM | POA: Diagnosis present

## 2019-08-24 DIAGNOSIS — N186 End stage renal disease: Secondary | ICD-10-CM | POA: Diagnosis present

## 2019-08-24 DIAGNOSIS — R778 Other specified abnormalities of plasma proteins: Secondary | ICD-10-CM | POA: Diagnosis present

## 2019-08-24 DIAGNOSIS — N2581 Secondary hyperparathyroidism of renal origin: Secondary | ICD-10-CM | POA: Diagnosis present

## 2019-08-24 DIAGNOSIS — Z9114 Patient's other noncompliance with medication regimen: Secondary | ICD-10-CM | POA: Diagnosis not present

## 2019-08-24 DIAGNOSIS — Z992 Dependence on renal dialysis: Secondary | ICD-10-CM | POA: Diagnosis not present

## 2019-08-24 DIAGNOSIS — E872 Acidosis: Secondary | ICD-10-CM | POA: Diagnosis present

## 2019-08-24 DIAGNOSIS — I420 Dilated cardiomyopathy: Secondary | ICD-10-CM | POA: Diagnosis not present

## 2019-08-24 LAB — COMPREHENSIVE METABOLIC PANEL WITH GFR
ALT: 26 U/L (ref 0–44)
AST: 18 U/L (ref 15–41)
Albumin: 2.6 g/dL — ABNORMAL LOW (ref 3.5–5.0)
Alkaline Phosphatase: 101 U/L (ref 38–126)
Anion gap: 19 — ABNORMAL HIGH (ref 5–15)
BUN: 63 mg/dL — ABNORMAL HIGH (ref 6–20)
CO2: 19 mmol/L — ABNORMAL LOW (ref 22–32)
Calcium: 9.4 mg/dL (ref 8.9–10.3)
Chloride: 101 mmol/L (ref 98–111)
Creatinine, Ser: 12.25 mg/dL — ABNORMAL HIGH (ref 0.44–1.00)
GFR calc Af Amer: 5 mL/min — ABNORMAL LOW
GFR calc non Af Amer: 4 mL/min — ABNORMAL LOW
Glucose, Bld: 107 mg/dL — ABNORMAL HIGH (ref 70–99)
Potassium: 5.1 mmol/L (ref 3.5–5.1)
Sodium: 139 mmol/L (ref 135–145)
Total Bilirubin: 1.3 mg/dL — ABNORMAL HIGH (ref 0.3–1.2)
Total Protein: 5.2 g/dL — ABNORMAL LOW (ref 6.5–8.1)

## 2019-08-24 LAB — LACTATE DEHYDROGENASE: LDH: 228 U/L — ABNORMAL HIGH (ref 98–192)

## 2019-08-24 LAB — CBC WITH DIFFERENTIAL/PLATELET
Abs Immature Granulocytes: 0.03 10*3/uL (ref 0.00–0.07)
Basophils Absolute: 0 10*3/uL (ref 0.0–0.1)
Basophils Relative: 0 %
Eosinophils Absolute: 0 10*3/uL (ref 0.0–0.5)
Eosinophils Relative: 0 %
HCT: 36.8 % (ref 36.0–46.0)
Hemoglobin: 11.3 g/dL — ABNORMAL LOW (ref 12.0–15.0)
Immature Granulocytes: 1 %
Lymphocytes Relative: 15 %
Lymphs Abs: 1 10*3/uL (ref 0.7–4.0)
MCH: 28.7 pg (ref 26.0–34.0)
MCHC: 30.7 g/dL (ref 30.0–36.0)
MCV: 93.4 fL (ref 80.0–100.0)
Monocytes Absolute: 0.4 10*3/uL (ref 0.1–1.0)
Monocytes Relative: 6 %
Neutro Abs: 5.2 10*3/uL (ref 1.7–7.7)
Neutrophils Relative %: 78 %
Platelets: 150 10*3/uL (ref 150–400)
RBC: 3.94 MIL/uL (ref 3.87–5.11)
RDW: 17.5 % — ABNORMAL HIGH (ref 11.5–15.5)
WBC: 6.6 10*3/uL (ref 4.0–10.5)
nRBC: 0 % (ref 0.0–0.2)

## 2019-08-24 LAB — FERRITIN: Ferritin: 179 ng/mL (ref 11–307)

## 2019-08-24 LAB — D-DIMER, QUANTITATIVE: D-Dimer, Quant: 2.87 ug/mL-FEU — ABNORMAL HIGH (ref 0.00–0.50)

## 2019-08-24 LAB — FIBRINOGEN: Fibrinogen: 355 mg/dL (ref 210–475)

## 2019-08-24 LAB — PROCALCITONIN: Procalcitonin: 1.92 ng/mL

## 2019-08-24 LAB — TACROLIMUS LEVEL: Tacrolimus (FK506) - LabCorp: <1 ng/mL — ABNORMAL LOW (ref 2.0–20.0)

## 2019-08-24 LAB — BRAIN NATRIURETIC PEPTIDE: B Natriuretic Peptide: >4500 pg/mL — ABNORMAL HIGH (ref 0.0–100.0)

## 2019-08-24 LAB — C-REACTIVE PROTEIN: CRP: 4.1 mg/dL — ABNORMAL HIGH (ref <1.0)

## 2019-08-24 MED ORDER — SODIUM BICARBONATE 650 MG PO TABS
1300.0000 mg | ORAL_TABLET | Freq: Three times a day (TID) | ORAL | Status: DC
  Filled 2019-08-24: qty 2

## 2019-08-24 MED ORDER — LEVALBUTEROL TARTRATE 45 MCG/ACT IN AERO
2.0000 | INHALATION_SPRAY | Freq: Three times a day (TID) | RESPIRATORY_TRACT | Status: DC
  Filled 2019-08-24: qty 15

## 2019-08-24 MED ORDER — SODIUM BICARBONATE 650 MG PO TABS
650.0000 mg | ORAL_TABLET | Freq: Two times a day (BID) | ORAL | Status: DC
  Administered 2019-08-24: 650 mg via ORAL
  Filled 2019-08-24 (×2): qty 1

## 2019-08-24 MED ORDER — IPRATROPIUM BROMIDE HFA 17 MCG/ACT IN AERS: 2.0000 | INHALATION_SPRAY | Freq: Three times a day (TID) | RESPIRATORY_TRACT | Status: DC

## 2019-08-24 NOTE — ED Notes (Signed)
Breakfast tray @ bedside. 

## 2019-08-24 NOTE — Progress Notes (Signed)
Renal Navigator was notified by Nephrologist that patient left AMA from the ED. Renal Navigator called patient to ensure that she knows schedule at [facility] (see previous Navigator note from 08/23/19). Patient stated understanding. Patient instructed to return to ED if in distress.  Alphonzo Cruise, [place] Renal Navigator [phone]

## 2019-08-24 NOTE — Plan of Care (Signed)
She left AMA.  Spoke with patient and asked her to present to the ER if acute distress.  She is to present to [facility] for HD on Thursday, 12/17.  SW has discussed with patient and family as well.  Start Coreg 12.5 mg BID - calling into [facility] on [address] through [employer] system Stop valcyte  Asked her to pick envarsus 1 mg daily dose - was already prescribed per transplant   Claudia Desanctis

## 2019-08-24 NOTE — ED Notes (Signed)
Admitting provider requested that we check and see if bed available for patient; spoke with [facility] and confirmed no bed available.

## 2019-08-24 NOTE — Progress Notes (Signed)
PROGRESS NOTE  Dominique Lawrence NWG:[id number] DOB: [birth year] DOA: 08/23/2019 PCP: Carmie End, MD  HPI/Recap of past 19 hours:  18 year old female with history of Alport syndrome status post failed renal transplant on chronic immunosuppressant now ESRD on HD Monday Wednesday Friday who presented to [facility] ED with complaints of dyspnea for 3-day duration.  Associated with nonproductive cough.  In the ED chest x-ray shows moderate cardiomegaly with hazy lung base opacities atelectasis versus infiltrates.  Mildly elevated troponin.  Positive Covid test on 08/23/2019.   Inflammatory markers elevated, procalcitonin also elevated.  IV remdesivir and IV decadron started.  IV antibiotics added empirically to cover CAP.  2D echo 12/14 showed LVEF 25 to 30% with moderate pericardial effusion, a new diagnosis for her.  Not clear if related to hypertension heart disease or COVID.   Seen by cardiology, following.  Also seen by nephrology for hemodialysis.  08/24/19: Seen and examined.  Denies chest pain.  Cough is improving.  Was not able to do hemodialysis on 08/23/2019.  Plan hemodialysis today.  She is on COVID-19 directed therapy.  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   Assessment/Plan: Principal Problem:   COVID-19 virus infection Active Problems:   Dyspnea   Tachycardia   Elevated troponin   Hypertension   ESRD (end stage renal disease) on dialysis ([place])   Acute COVID-19 viral pneumonia superimposed by community-acquired pneumonia  Positive COVID-19 testing 08/23/19 Elevated procalcitonin  Independently viewed chest x-ray admission which shows patchy opacity worse on the right Started on remdesivir, day #2/5, end date 08/27/2019. Started on IV Decadron 6 mg daily, day #2, complete 10 days Start Xopenex inhaler 2 puffs 3 times daily and ipratropium inhaler 2 puffs 3 times daily. Flutter valve every 4  hours.   Started on IV azithromycin and Rocephin, day #2 continue; procalcitonin is gradually trending down.  Newly diagnosed dilated cardiomyopathy with LVEF 25 to 30% and global hypokinesis Presented with BNP greater than 4500 Evaluated by cardiology, following. not clear if related to her hypertension or Covid. Management per cardiology Volume status managed through hemodialysis and Lasix On p.o. Lasix 80 mg daily on nonhemodialysis days. Start strict I's and O's and daily weights.  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  Disposition Plan: Patient is currently not appropriate for discharge at this time due to ongoing cardiac evaluation in the setting of COVID-19 viral infection.  Patient will require at least 2 midnights for further evaluation and treatment of present condition.  She will also require to complete COVID-19 directed therapy.   Consultants:  Nephrology  Procedures:  HD cath on 08/24/2019.  Antimicrobials:  IV azithromycin and Rocephin 12/14>>  DVT prophylaxis:   Subcu heparin 3 times daily  Objective: Vitals:   08/24/19 0900 08/24/19 1000 08/24/19 1100 08/24/19 1200  BP: (!) 148/117 (!) 135/99 (!) 133/100 113/80  Pulse:      Resp:   20 (!) 23  Temp:      TempSrc:      SpO2:  Intake/Output Summary (Last 24 hours) at 08/24/2019 1248 Last data filed at 08/23/2019 1539 Gross per 24 hour  Intake 250 ml  Output --  Net 250 ml   There were no  vitals filed for this visit.  Exam:  . General: 18 y.o. year-old female well developed well nourished in no acute distress.  Alert and oriented x3. . Cardiovascular: Regular rate and rhythm with no rubs or gallops.  No thyromegaly or JVD noted.   Marland Kitchen Respiratory: Mild rales at bases.  No wheezing noted.  Poor inspiratory effort. . Abdomen: Soft nontender nondistended with normal bowel sounds x4 quadrants. . Musculoskeletal: Trace lower extremity in all 4 extremities. 2/4 pulses in all 4 extremities. Marland Kitchen Psychiatry: Mood is appropriate for condition and setting   Data Reviewed: CBC: Recent Labs  Lab 08/23/19 0115 08/23/19 0303 08/23/19 0346 08/24/19 0540  WBC 5.5 5.8  --  6.6  NEUTROABS  --   --   --  5.2  HGB 11.3* 11.4* 11.6* 11.3*  HCT 37.1 38.1 34.0* 36.8  MCV 96.4 97.4  --  93.4  PLT 132* 147*  --  485   Basic Metabolic Panel: Recent Labs  Lab 08/23/19 0115 08/23/19 0303 08/23/19 0346 08/24/19 0540  NA 138 136 133* 139  K 5.3* 5.8* 5.1 5.1  CL 99 96*  --  101  CO2 14* 11*  --  19*  GLUCOSE 77 38*  --  107*  BUN 49* 51*  --  63*  CREATININE 10.36* 10.72*  --  12.25*  CALCIUM 9.9 10.1  --  9.4   GFR: CrCl cannot be calculated (Unknown ideal weight.). Liver Function Tests: Recent Labs  Lab 08/23/19 0303 08/24/19 0540  AST 47* 18  ALT 37 26  ALKPHOS 137* 101  BILITOT 2.8* 1.3*  PROT 7.0 5.2*  ALBUMIN 3.7 2.6*   No results for input(s): LIPASE, AMYLASE in the last 168 hours. No results for input(s): AMMONIA in the last 168 hours. Coagulation Profile: No results for input(s): INR, PROTIME in the last 168 hours. Cardiac Enzymes: No results for input(s): CKTOTAL, CKMB, CKMBINDEX, TROPONINI in the last 168 hours. BNP (last 3 results) No results for input(s): PROBNP in the last 8760 hours. HbA1C: Recent Labs    08/23/19 0531  HGBA1C 4.1*   CBG: Recent Labs  Lab 08/23/19 0511 08/23/19 0558 08/23/19 0700 08/23/19 2200  GLUCAP 17* 90 87 97   Lipid  Profile: No results for input(s): CHOL, HDL, LDLCALC, TRIG, CHOLHDL, LDLDIRECT in the last 72 hours. Thyroid Function Tests: No results for input(s): TSH, T4TOTAL, FREET4, T3FREE, THYROIDAB in the last 72 hours. Anemia Panel: Recent Labs    08/23/19 0441 08/24/19 0540  FERRITIN 347* 179   Urine analysis:    Component Value Date/Time   COLORURINE STRAW (A) 02/26/2019 1404   APPEARANCEUR CLEAR 02/26/2019 1404   LABSPEC 1.008 02/26/2019 1404   PHURINE 9.0 (H) 02/26/2019 1404   GLUCOSEU 50 (A) 02/26/2019 1404   HGBUR SMALL (A) 02/26/2019 1404   BILIRUBINUR NEGATIVE 02/26/2019 1404   BILIRUBINUR neg 08/11/2017 1139   KETONESUR NEGATIVE 02/26/2019 1404   PROTEINUR 100 (A) 02/26/2019 1404   UROBILINOGEN negative (A) 08/11/2017 1139   UROBILINOGEN 0.2 02/26/2015 1600   NITRITE NEGATIVE 02/26/2019 1404   LEUKOCYTESUR SMALL (A) 02/26/2019 1404   Sepsis Labs: @LABRCNTIP (procalcitonin:4,lacticidven:4)  ) Recent Results (from the past 240 hour(s))  Respiratory Panel by RT PCR (Flu A&B, Covid) - Nasopharyngeal Swab     Status: Abnormal  Collection Time: 08/23/19  2:34 AM   Specimen: Nasopharyngeal Swab  Result Value Ref Range Status   SARS Coronavirus 2 by RT PCR POSITIVE (A) NEGATIVE Final    Comment: RESULT CALLED TO, READ BACK BY AND VERIFIED WITH: RN E ROBERTSON @0422  08/23/19 BY S GEZAHEGN (NOTE) SARS-CoV-2 target nucleic acids are DETECTED. SARS-CoV-2 RNA is generally detectable in upper respiratory specimens  during the acute phase of infection. Positive results are indicative of the presence of the identified virus, but do not rule out bacterial infection or co-infection with other pathogens not detected by the test. Clinical correlation with patient history and other diagnostic information is necessary to determine patient infection status. The expected result is Negative. Fact Sheet for Patients:  PinkCheek.be Fact Sheet for Healthcare  Providers: GravelBags.it This test is not yet approved or cleared by the [place] FDA and  has been authorized for detection and/or diagnosis of SARS-CoV-2 by FDA under an Emergency Use Authorization (EUA).  This EUA will remain in effect (meaning this test can be  used) for the duration of  the COVID-19 declaration under Section 564(b)(1) of the Act, 21 U.S.C. section 360bbb-3(b)(1), unless the authorization is terminated or revoked sooner.    Influenza A by PCR NEGATIVE NEGATIVE Final   Influenza B by PCR NEGATIVE NEGATIVE Final    Comment: (NOTE) The Xpert Xpress SARS-CoV-2/FLU/RSV assay is intended as an aid in  the diagnosis of influenza from Nasopharyngeal swab specimens and  should not be used as a sole basis for treatment. Nasal washings and  aspirates are unacceptable for Xpert Xpress SARS-CoV-2/FLU/RSV  testing. Fact Sheet for Patients: PinkCheek.be Fact Sheet for Healthcare Providers: GravelBags.it This test is not yet approved or cleared by the [place] FDA and  has been authorized for detection and/or diagnosis of SARS-CoV-2 by  FDA under an Emergency Use Authorization (EUA). This EUA will remain  in effect (meaning this test can be used) for the duration of the  Covid-19 declaration under Section 564(b)(1) of the Act, 21  U.S.C. section 360bbb-3(b)(1), unless the authorization is  terminated or revoked. Performed at [facility], [place] [address] [place], [place] [address]       Studies: NM Pulmonary Perfusion  Result Date: 08/23/2019 CLINICAL DATA:  Dyspnea since Sunday, loss of smell, diarrhea, Alport syndrome, COVID-19 positive EXAM: NUCLEAR MEDICINE PERFUSION LUNG SCAN TECHNIQUE: Perfusion images were obtained in multiple projections after intravenous injection of radiopharmaceutical. Ventilation scans intentionally deferred if perfusion scan and chest x-ray  adequate for interpretation during COVID 19 epidemic. RADIOPHARMACEUTICALS:  1.65 mCi Tc-32m MAA IV COMPARISON:  None Correlation: Chest radiograph 08/23/2019 FINDINGS: Enlargement of cardiac silhouette. Perfusion is otherwise normal throughout both lungs. No definite segmental or subsegmental perfusion defects identified. Chest radiograph demonstrates enlargement of cardiac silhouette with pulmonary vascular congestion. IMPRESSION: Very low probability of pulmonary embolism. Electronically Signed   By: Lavonia Dana M.D.   On: 08/23/2019 17:32    Scheduled Meds: . carvedilol  12.5 mg Oral BID WC  . dexamethasone (DECADRON) injection  6 mg Intravenous Q24H  . [START ON 08/25/2019] doxercalciferol  2 mcg Intravenous Q M,W,F-HD  . furosemide  80 mg Oral Q T,Th,S,Su  . heparin  5,000 Units Subcutaneous Q8H  . hydrALAZINE  10 mg Oral Q8H  . pantoprazole  40 mg Oral Daily  . sevelamer carbonate  800 mg Oral TID WC  . sodium bicarbonate  650 mg Oral BID  . sodium chloride flush  3 mL Intravenous  Q12H  . Tacrolimus  1 mg Oral Daily    Continuous Infusions: . sodium chloride    . sodium chloride    . sodium chloride    . azithromycin 500 mg (08/24/19 1134)  . cefTRIAXone (ROCEPHIN)  IV Stopped (08/23/19 2002)  . remdesivir 100 mg in NS 100 mL 100 mg (08/24/19 1002)     LOS: 0 days     Kayleen Memos, MD [facility] Pager [phone]  If 7PM-7AM, please contact night-coverage [email or web address] Password [facility] 08/24/2019, 12:48 PM

## 2019-08-24 NOTE — ED Notes (Signed)
Pt provided phone to speak with attending [place] Crane DO

## 2019-08-24 NOTE — Progress Notes (Signed)
Subjective:  Breathign better BP still elevated   Objective:  Vitals:   08/24/19 0600 08/24/19 0605 08/24/19 0700 08/24/19 0800  BP: (!) 143/107 (!) 142/105 (!) 146/109 (!) 150/113  Pulse: 87 82 83 86  Resp: (!) 24 (!) 25 (!) 21 (!) 22  Temp:      TempSrc:      SpO2: 96% 97% 98% 100%    Intake/Output from previous day:  Intake/Output Summary (Last 24 hours) at 08/24/2019 0908 Last data filed at 08/23/2019 1539 Gross per 24 hour  Intake 250 ml  Output 100 ml  Net 150 ml    Physical Exam: Affect appropriate Chronically ill [place] female  HEENT: normal Right subclavian dialysis catheter  JVP normal no bruits no thyromegaly Lungs clear with no wheezing and good diaphragmatic motion Heart:  S1/S2 no murmur, no rub, gallop or click PMI normal Abdomen: benighn, BS positve, no tenderness, no AAA no bruit.  No HSM or HJR Transplanted kidney in LLQ Distal pulses intact with no bruits No edema Neuro non-focal Skin warm and dry No muscular weakness  Lab Results: Basic Metabolic Panel: Recent Labs    08/23/19 0303 08/23/19 0346 08/24/19 0540  NA 136 133* 139  K 5.8* 5.1 5.1  CL 96*  --  101  CO2 11*  --  19*  GLUCOSE 38*  --  107*  BUN 51*  --  63*  CREATININE 10.72*  --  12.25*  CALCIUM 10.1  --  9.4   Liver Function Tests: Recent Labs    08/23/19 0303 08/24/19 0540  AST 47* 18  ALT 37 26  ALKPHOS 137* 101  BILITOT 2.8* 1.3*  PROT 7.0 5.2*  ALBUMIN 3.7 2.6*   No results for input(s): LIPASE, AMYLASE in the last 72 hours. CBC: Recent Labs    08/23/19 0303 08/23/19 0346 08/24/19 0540  WBC 5.8  --  6.6  NEUTROABS  --   --  5.2  HGB 11.4* 11.6* 11.3*  HCT 38.1 34.0* 36.8  MCV 97.4  --  93.4  PLT 147*  --  150   D-Dimer: Recent Labs    08/23/19 0500 08/24/19 0540  DDIMER 3.72* 2.87*   Hemoglobin A1C: Recent Labs    08/23/19 0531  HGBA1C 4.1*    Anemia Panel: Recent Labs    08/24/19 0540  FERRITIN 179    Imaging: DG Chest 2  View  Result Date: 08/23/2019 CLINICAL DATA:  Chest pain and shortness of breath. Dialysis patient. EXAM: CHEST - 2 VIEW COMPARISON:  None. FINDINGS: Right-sided dialysis catheter tip at the atrial caval junction. Moderate cardiomegaly. There are small bilateral pleural effusions and hazy lung base opacities. No convincing pulmonary edema. No pneumothorax. No acute osseous abnormalities are seen. IMPRESSION: 1. Moderate cardiomegaly. Small bilateral pleural effusions and hazy lung base opacities, likely atelectasis. 2.  Right-sided dialysis catheter tip at the atrial caval junction. Electronically Signed   By: Keith Rake M.D.   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 Ventilation scans intentionally deferred if perfusion scan and chest x-ray adequate for interpretation during COVID 19 epidemic. RADIOPHARMACEUTICALS:  1.65 mCi Tc-38m MAA IV COMPARISON:  None Correlation: Chest radiograph 08/23/2019 FINDINGS: Enlargement of cardiac silhouette. Perfusion is otherwise normal throughout both lungs. No definite segmental  or subsegmental perfusion defects identified. Chest radiograph demonstrates enlargement of cardiac silhouette with pulmonary vascular congestion. IMPRESSION: Very low probability of pulmonary embolism. Electronically Signed   By: Lavonia Dana M.D.   On: 08/23/2019 17:32   ECHOCARDIOGRAM COMPLETE  Result Date: 08/23/2019   ECHOCARDIOGRAM REPORT   Patient Name:   Dominique Lawrence Date of Exam: 08/23/2019 Medical Rec #:  [id number]                     Height:       65.0 in Accession #:    [id number]                    Weight:       163.6 lb Date of Birth:  03-Aug-2001                    BSA:           1.82 m Patient Age:    17 years                      BP:           178/133 mmHg Patient Gender: F                             HR:           101 bpm. Exam Location:  Inpatient Procedure: 2D Echo Indications:    Dyspnea R06.00  History:        Patient has no prior history of Echocardiogram examinations.                 Covid-19 Positive.  Sonographer:    Mikki Santee RDCS (AE) Referring Phys: [place]  1. Left ventricular ejection fraction, by visual estimation, is 25 to 30%. The left ventricle has severely decreased function. There is no left ventricular hypertrophy.  2. The left ventricle demonstrates global hypokinesis.  3. Global right ventricle has mildly reduced systolic function.The right ventricular size is normal. No increase in right ventricular wall thickness.  4. Left atrial size was severely dilated.  5. Right atrial size was normal.  6. Moderate pericardial effusion.  7. Pericardial effusion does not appear to be causing hemodynamic compromise by echo criteria. CLinical correlation indicated.  8. The mitral valve is normal in structure. Moderate mitral valve regurgitation.  9. The tricuspid valve is normal in structure. Tricuspid valve regurgitation is trivial. 10. The aortic valve is normal in structure. Aortic valve regurgitation is trivial. 11. Pulmonic regurgitation is mild. 12. The pulmonic valve was normal in structure. Pulmonic valve regurgitation is mild. 13. Moderately elevated pulmonary artery systolic pressure. 14. The inferior vena cava is normal in size with <50% respiratory variability, suggesting right atrial pressure of 8 mmHg. FINDINGS  Left Ventricle: Left ventricular ejection fraction, by visual estimation, is 25 to 30%. The left ventricle has severely decreased function. The left ventricle demonstrates global hypokinesis. The left ventricular internal cavity size was the left ventricle is normal in size. There is no left ventricular hypertrophy. Right  Ventricle: The right ventricular size is normal. No increase in right ventricular wall thickness. Global RV systolic function is has mildly reduced systolic function. The tricuspid regurgitant velocity is 3.00 m/s, and with an assumed right atrial pressure of 8 mmHg, the estimated right ventricular systolic pressure is moderately elevated at 44.1 mmHg. Left Atrium:  Left atrial size was severely dilated. Right Atrium: Right atrial size was normal in size Pericardium: A moderately sized pericardial effusion is present. Pericardial effusion does not appear to be causing hemodynamic compromise by echo criteria. CLinical correlation indicated. Mitral Valve: The mitral valve is normal in structure. Moderate mitral valve regurgitation. Tricuspid Valve: The tricuspid valve is normal in structure. Tricuspid valve regurgitation is trivial. Aortic Valve: The aortic valve is normal in structure. Aortic valve regurgitation is trivial. Pulmonic Valve: The pulmonic valve was normal in structure. Pulmonic valve regurgitation is mild. Pulmonic regurgitation is mild. Aorta: The aortic root is normal in size and structure. Venous: The inferior vena cava is normal in size with less than 50% respiratory variability, suggesting right atrial pressure of 8 mmHg. IAS/Shunts: No atrial level shunt detected by color flow Doppler.  LEFT VENTRICLE PLAX 2D LVIDd:         5.15 cm  Diastology LVIDs:         4.47 cm  LV e' lateral: 8.05 cm/s LV PW:         1.01 cm  LV e' medial:  5.22 cm/s LV IVS:        0.55 cm LVOT diam:     2.00 cm LV SV:         36 ml LV SV Index:   19.14 LVOT Area:     3.14 cm  RIGHT VENTRICLE TAPSE (M-mode): 1.2 cm LEFT ATRIUM             Index       RIGHT ATRIUM           Index LA diam:        4.60 cm 2.53 cm/m  RA Area:     12.20 cm LA Vol (A2C):   93.7 ml 51.61 ml/m RA Volume:   29.60 ml  16.30 ml/m LA Vol (A4C):   95.5 ml 52.60 ml/m LA Biplane Vol: 99.7 ml 54.92 ml/m  AORTIC VALVE LVOT Vmax:   70.80 cm/s LVOT  Vmean:  43.700 cm/s LVOT VTI:    0.100 m  AORTA Ao Root diam: 3.00 cm MR Peak grad:    142.6 mmHg  TRICUSPID VALVE MR Mean grad:    93.0 mmHg   TR Peak grad:   36.1 mmHg MR Vmax:         597.00 cm/s TR Vmax:        324.00 cm/s MR Vmean:        445.0 cm/s MR PISA:         1.57 cm    SHUNTS MR PISA Eff ROA: 9 mm       Systemic VTI:  0.10 m MR PISA Radius:  0.50 cm     Systemic Diam: 2.00 cm  Dorris Carnes MD Electronically signed by Dorris Carnes MD Signature Date/Time: 08/23/2019/11:04:02 AM    Final     Cardiac Studies:  ECG: ST no pericardial changes    Telemetry:  NSR   Echo: 08/23/19 EF 25-30% moderate pericardial effusion on tamponade   Medications:    carvedilol  12.5 mg Oral BID WC   dexamethasone (DECADRON) injection  6 mg Intravenous Q24H   [START ON 08/25/2019] doxercalciferol  2 mcg Intravenous Q M,W,F-HD   furosemide  80 mg Oral Q T,Th,S,Su   heparin  5,000 Units Subcutaneous Q8H   hydrALAZINE  10 mg Oral Q8H   pantoprazole  40 mg Oral Daily   sevelamer carbonate  800 mg Oral TID WC  sodium bicarbonate  650 mg Oral BID   sodium chloride flush  3 mL Intravenous Q12H   Tacrolimus  1 mg Oral Daily      sodium chloride     sodium chloride     sodium chloride     azithromycin Stopped (08/23/19 2002)   cefTRIAXone (ROCEPHIN)  IV Stopped (08/23/19 2002)   remdesivir 100 mg in NS 100 mL      Assessment/Plan:   1. DCM:  New diagnosis not clear if related to HTN heart disease or COVID although no acute ECG changes and troponin only 72->79 On oral lasix on non dialysis days fluid management will mostly be per renal Started on hydralazine yesterday Continue Coreg   2. 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

## 2019-08-24 NOTE — ED Notes (Signed)
Pt placed on bedside toilet.

## 2019-08-24 NOTE — ED Notes (Signed)
Pt left ED AMA, attending [place] Crane aware, charge RN aware. Pt provided Covid 19 resources per attending [facility] request, pt reports discharge ride.

## 2019-08-24 NOTE — Progress Notes (Signed)
[facility] Progress Note  Name: Hyun Reali MRN: [id number] DOB: 11-05-2000  Chief Complaint:  Shortness of breath   Subjective:  Still hasn't had dialysis yet as she hasn't gotten a bed.  Spoke with charge RN and they have been trying to get her a room.  Spoke with nursing this AM.  Spoke with transplant on 12/14 - she was supposed to be taking tacrolimus 1 mg TB 24 daily however hadn't picked up her prescription and had been taking more than directed.  She has been noncompliant.  valcyte was stopped at her last appt.  They had left her on bactrim.  She took kayexalate this AM at 3:00 per nursing.   Review of systems:  Denies shortness of breath or chest pain  Denies nausea or vomiting  Slept well    ------------- Background on consult: Dominique Lawrence is a 18 y.o. female with a history of end-stage renal disease on dialysis Monday Wednesday Friday at [facility] via right IJ tunneled catheter and s/p failed renal transplant who presented to the hospital with shortness of breath.  She was diagnosed as Covid positive here in the ER.  She is being admitted to medicine.  The patient states that she has been attending dialysis treatment.  She states that she has been off of antihypertensives since August because she states it wasn't helping - has been on amlodipine at home.  She denies any dizziness or cramping.  Note that her dry weight was recently lowered by half a kilo as below for hypertension.  She was found to have a LVEF of 25 to 30% with a moderate pericardial effusion on TTE today.  Cardiology was consulted.  States that she takes one 4 mg tablet three times a day and she still takes the bactrim; she isn't taking valcyte anymore.  Not on prednisone.  Transplant in 08/2018 and has been on dialysis since 01/2019 she reports.   Spoke with transplant on 12/14 - she was supposed to be taking tacrolimus 1 mg TB 24 daily however hadn't picked up her  prescription and had been taking more than directed.  She has been noncompliant.  valcyte was stopped at her last appt.  They had left her on bactrim.    Intake/Output Summary (Last 24 hours) at 08/24/2019 0614 Last data filed at 08/23/2019 1539 Gross per 24 hour  Intake 250 ml  Output 100 ml  Net 150 ml    Vitals:  Vitals:   08/24/19 0300 08/24/19 0341 08/24/19 0500 08/24/19 0605  BP: (!) 140/106 (!) 148/109 (!) 142/106 (!) 142/105  Pulse: 78 83 81 82  Resp: 16 14 19  (!) 25  Temp:      TempSrc:      SpO2: 99% 99% 97% 97%     Physical Exam:  Patient was examined on 12/14 with appropriate PPE.  Due to the nature of this patient's [id number] with isolation and in keeping with efforts to prevent the spread of infection and to conserve personal protective equipment, the patient was examined from the window.  Patient's symptoms and exam were discussed in detail with the RN.  A chart review of other providers notes and the patient's lab work as well as review of other pertinent studies was performed.  Exam details from prior documentation were reviewed specifically and confirmed with the bedside nurse.  Location of service: [place] adult female in bed in no acute distress HEENT normocephalic atraumatic  Lungs  fine crackles on auscultation per nursing; normal work of breathing at rest; on room air Heart regular rate and rhythm no rubs or gallops appreciated Abdomen soft nontender nondistended per nursing Extremities no edema per nursing Psych normal mood and affect per nursing Neuro - alert and appropriate per nursing    Medications reviewed   Labs:  BMP Latest Ref Rng & Units 08/23/2019 08/23/2019 08/23/2019  Glucose 70 - 99 mg/dL - 38(LL) 77  BUN 6 - 20 mg/dL - 51(H) 49(H)  Creatinine 0.44 - 1.00 mg/dL - 10.72(H) 10.36(H)  BUN/Creat Ratio 6 - 22 (calc) - - -  Sodium 135 - 145 mmol/L 133(L) 136 138  Potassium 3.5 - 5.1 mmol/L 5.1 5.8(H) 5.3(H)  Chloride 98 -  111 mmol/L - 96(L) 99  CO2 22 - 32 mmol/L - 11(L) 14(L)  Calcium 8.9 - 10.3 mg/dL - 10.1 9.9     Assessment/Plan:   # End-stage renal disease on hemodialysis - HD per Monday Wednesday Friday schedule  - HD today, 12/15 and 12/16 as well for clearance. Still waiting on a room - Lasix 80 mg PO on non-HD days for now   # COVID-19 infection Therapies per primary team Optimize volume status  # Acute systolic CHF - Cardiology has been consulted - Optimize volume status with UF  # pericardial effusion  - UF with HD   # Hypertension - States off of meds at home - here was started on coreg, hydralazine and restarted on amlodipine - have discontinued amlodipine to allow for UF with HD - may need to resume at 5 mg daily  - Volume removal with dialysis today  # Metabolic acidosis  - For HD - bicarb until HD   # Immunosuppressed - s/p failed renal transplant - on tacrolimus - listed as 12 mg daily however per transplant is on 1 mg XR daily - have reordered 1 mg XR daily - valcyte was stopped per transplant note 05/2019  # Secondary hyperparathyroidism  - on hectorol and renvela    Claudia Desanctis, MD 08/24/2019 6:14 AM

## 2019-08-24 NOTE — ED Notes (Signed)
Lunch Tray Ordered @ 1126.  

## 2019-08-24 NOTE — Progress Notes (Signed)
Received a page from bedside RN Festus Aloe regarding the patient wanting to leave against medical advice around 1330.  Explained to Ms. Dominique Lawrence this morning while in her room in the ED that it is best to complete her course of Remdesivir prior to discharge from the hospital.  She has a new cardiomyopathy which is suspected to be COVID 19 related.  Bedside RN Caryl Pina connected me to the patient via phone in the ED.  Explained to the patient the risks of leaving against medical advice in her current condition, which include death.  Patient is alert and oriented x 4.  She is married.  Offered to talk to her husband and/or her parents with her permission to discuss further why she needs to stay hospitalized, she declined.  She is adamant about leaving and going home.  States she lives with her husband and he is planning on also being tested for COVID.  Explained the need to quarantine, she agrees.  Asked bedside RN Caryl Pina to contact her charge nurse or supervisor in the ED in order to follow any hospital regulations or policy that may be in place for positive COVID-19 patients leaving AMA.  Caryl Pina stated all the patient would have to do is sign a Leaving AMA form and go.  I explained to Caryl Pina my concern over public health safety.  Requested that she provides Ms. Dominique Lawrence with the necessary printed instructions to quarantine, State isolation guidelines which are readily available and printable prior to letting Ms. Dominique Lawrence leave against medical advice.  At first Caryl Pina was not receptive.  Explained to her why it is important and that these resources are readily available.  She agreed to provide this information to the patient.

## 2019-08-24 NOTE — ED Notes (Signed)
Pt requesting to be discharged home, this nurse notified hospitalist Ethel.

## 2019-08-25 ENCOUNTER — Telehealth: Payer: Self-pay | Admitting: Pediatrics

## 2019-08-25 NOTE — Telephone Encounter (Signed)
I called and spoke with Dominique Lawrence to see how she is doing today after signing herself out AMA yesterday from the hospital.  She reports that she is feeling "good".  She denies any shortness of breath.  She reports that she has dialysis scheduled for tomorrow and will be going to a different location for dialysis due to her positive COVID test.  I reminded her of the need to seek emergency care for any worsening of symptoms - she voiced understanding and agreement.  She reports that she would like to continue her primary care at the [facility] for now.  I advised her of the need to call to schedule her annual PE with me for January or February .

## 2019-08-28 LAB — CULTURE, BLOOD (ROUTINE X 2)
Culture: NO GROWTH
Culture: NO GROWTH
Special Requests: ADEQUATE
Special Requests: ADEQUATE

## 2019-09-14 ENCOUNTER — Encounter: Payer: Self-pay | Admitting: Vascular Surgery

## 2019-09-14 ENCOUNTER — Ambulatory Visit (INDEPENDENT_AMBULATORY_CARE_PROVIDER_SITE_OTHER)
Admission: RE | Admit: 2019-09-14 | Discharge: 2019-09-14 | Disposition: A | Discharge status: Home / Self Care | Payer: Medicare Other | Source: Ambulatory Visit | Attending: Vascular Surgery | Admitting: Vascular Surgery

## 2019-09-14 ENCOUNTER — Other Ambulatory Visit: Payer: Self-pay

## 2019-09-14 ENCOUNTER — Ambulatory Visit (INDEPENDENT_AMBULATORY_CARE_PROVIDER_SITE_OTHER): Payer: Medicare Other | Admitting: Vascular Surgery

## 2019-09-14 ENCOUNTER — Ambulatory Visit (HOSPITAL_COMMUNITY)
Admission: RE | Admit: 2019-09-14 | Discharge: 2019-09-14 | Disposition: A | Discharge status: Home / Self Care | Payer: Medicare Other | Source: Ambulatory Visit | Attending: Vascular Surgery | Admitting: Vascular Surgery

## 2019-09-14 VITALS — BP 154/112 | HR 100 | Temp 97.6°F | Resp 20 | Ht 64.0 in | Wt 129.0 lb

## 2019-09-14 DIAGNOSIS — N181 Chronic kidney disease, stage 1: Secondary | ICD-10-CM | POA: Diagnosis present

## 2019-09-14 DIAGNOSIS — Z992 Dependence on renal dialysis: Secondary | ICD-10-CM | POA: Diagnosis not present

## 2019-09-14 DIAGNOSIS — N186 End stage renal disease: Secondary | ICD-10-CM

## 2019-09-14 NOTE — Progress Notes (Signed)
[facility] [place]  Patient name: Dominique Lawrence MRN: [id number] DOB: 05-14-2001 Sex: female  REASON FOR CONSULT: Discussion of options for hemodialysis  HPI: Dominique Lawrence is a 19 y.o. female, who is here today for discussion of access for hemodialysis.  Is currently being dialyzed via right IJ tunneled catheter.  She has never had arm access.  She is right-handed.  Past Medical History:  Diagnosis Date  . Alport syndrome   . Alport syndrome   . Anemia   . Constipation 12/18/2013  . Depression 07/28/2013  . Influenza B   . S/P wisdom tooth extraction 06/22/2016   Right mandibular dental pain    Family History  Problem Relation Age of Onset  . Alport syndrome Brother     SOCIAL HISTORY: Social History   Socioeconomic History  . Marital status: Married    Spouse name: Not on file  . Number of children: Not on file  . Years of education: Not on file  . Highest education level: Not on file  Occupational History  . Not on file  Tobacco Use  . Smoking status: Never Smoker  . Smokeless tobacco: Never Used  Substance and Sexual Activity  . Alcohol use: No  . Drug use: No  . Sexual activity: Not Currently    Birth control/protection: Implant  Other Topics Concern  . Not on file  Social History Narrative   Lives with Mom, Sister, & 2 Brothers. No pets at home. No one smokes.   Social Determinants of Health   Financial Resource Strain:   . Difficulty of Paying Living Expenses: Not on file  Food Insecurity:   . Worried About Charity fundraiser in the Last Year: Not on file  . Ran Out of Food in the Last Year: Not on file  Transportation Needs:   . Lack of Transportation (Medical): Not on file  . Lack of Transportation (Non-Medical): Not on file  Physical Activity:   . Days of Exercise per Week: Not on file  . Minutes of Exercise per Session: Not on file  Stress:   .  Feeling of Stress : Not on file  Social Connections:   . Frequency of Communication with Friends and Family: Not on file  . Frequency of Social Gatherings with Friends and Family: Not on file  . Attends Religious Services: Not on file  . Active Member of Clubs or Organizations: Not on file  . Attends Archivist Meetings: Not on file  . Marital Status: Not on file  Intimate Partner Violence:   . Fear of Current or Ex-Partner: Not on file  . Emotionally Abused: Not on file  . Physically Abused: Not on file  . Sexually Abused: Not on file    Allergies  Allergen Reactions  . Enalapril Other (See Comments)    AKI  . Chlorhexidine Gluconate Hives  . 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 amLODipine (NORVASC) 10 MG tablet Take 10 mg by mouth daily.    Marland Kitchen etonogestrel (NEXPLANON) 68 MG IMPL implant 68 mg by Subdermal route once. Jan 2018    . furosemide (LASIX) 40 MG tablet Take by mouth.    Marland Kitchen omeprazole (PRILOSEC) 20 MG capsule Take by mouth.    . sevelamer carbonate (RENVELA) 800 MG tablet Take 800 mg by mouth 3 (  three) times daily with meals.    Marland Kitchen sulfamethoxazole-trimethoprim (BACTRIM) 400-80 MG tablet Take by mouth.    . Tacrolimus 4 MG TB24 Take 12 mg by mouth daily.     No current facility-administered medications for this visit.    REVIEW OF SYSTEMS:  [X]  denotes positive finding, [ ]  denotes negative finding Cardiac  Comments:  Chest pain or chest pressure:    Shortness of breath upon exertion:    Short of breath when lying flat:    Irregular heart rhythm:        Vascular    Pain in calf, thigh, or hip brought on by ambulation:    Pain in feet at night that wakes you up from your sleep:     Blood clot in your veins:    Leg swelling:         Pulmonary    Oxygen at home:    Productive cough:     Wheezing:         Neurologic    Sudden  weakness in arms or legs:     Sudden numbness in arms or legs:     Sudden onset of difficulty speaking or slurred speech:    Temporary loss of vision in one eye:     Problems with dizziness:         Gastrointestinal    Blood in stool:     Vomited blood:         Genitourinary    Burning when urinating:     Blood in urine:        Psychiatric    Major depression:         Hematologic    Bleeding problems:    Problems with blood clotting too easily:        Skin    Rashes or ulcers:        Constitutional    Fever or chills:      PHYSICAL EXAM: Vitals:   09/14/19 1347  BP: (!) 154/112  Pulse: 100  Resp: 20  Temp: 97.6 F (36.4 C)  SpO2: 99%  Weight: 129 lb (58.5 kg)  Height: 5\' 4"  (1.626 m)    GENERAL: The patient is a well-nourished female, in no acute distress. The vital signs are documented above. CARDIOVASCULAR: 2+ radial pulses bilaterally.  Small surface veins in both upper extremities. PULMONARY: There is good air exchange  ABDOMEN: Soft and non-tender  MUSCULOSKELETAL: There are no major deformities or cyanosis. NEUROLOGIC: No focal weakness or paresthesias are detected. SKIN: There are no ulcers or rashes noted. PSYCHIATRIC: The patient has a normal affect.  DATA:  Noninvasive studies revealed normal arterial flow to the level of the wrist bilaterally.  Venous studies revealed small surface veins bilaterally.  Her left antecubital vein was moderate size but the vein was relatively small above  MEDICAL ISSUES: Had long discussion regarding options for hemodialysis access.  Discussed both AV fistula and AV graft placement.  She certainly appears to have small veins for fistula attempt.  Would recommend exploration and imaging at the time of surgery and place either a left upper arm graft or left upper arm AV fistula depending on vein size.  Coordinate this at her earliest [place]. Vasilios Ottaway, MD [facility]  [facility] Tel [phone] Pager [phone]

## 2019-09-15 ENCOUNTER — Encounter: Payer: Self-pay | Admitting: Surgery

## 2019-09-15 DIAGNOSIS — N2581 Secondary hyperparathyroidism of renal origin: Secondary | ICD-10-CM | POA: Diagnosis present

## 2019-09-15 NOTE — H&P (Signed)
General Surgery [facility]  Dominique Lawrence DOB: 01-31-2001 Married / Language: English / Race: White Female   History of Present Illness   The patient is a 19 year old female who presents with secondary hyperparathyroidism.  CHIEF COMPLAINT: secondary hyperparathyroidism, renal  Patient is referred by Dr. Edrick Oh for surgical evaluation and management of secondary hyperparathyroidism of renal origin. Patient has end-stage renal disease and is currently on hemodialysis. She has an access through a right subclavian catheter. She dialyzes on Mondays Wednesdays and Fridays at the [facility]. Patient has essentially had lifelong kidney disease diagnosed at age 47. 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 Laboratory studies show evidence of secondary hyperparathyroidism. Intact PTH level in late October 2020 was markedly elevated at 2535. Phosphorus level was elevated at 6.4. Patient is referred at this time for consideration for total parathyroidectomy with autotransplantation for management of secondary hyperparathyroidism.   Past Surgical History  Nephrectomy  Right. Oral Surgery   Diagnostic Studies History Colonoscopy  never Mammogram  never  Allergies NSAIDs  kidney disease Allopurinol *CHEMICALS*  kidney disease  Medication History  Nexplanon (68MG  Implant, Subcutaneous) Active. Millipred (5MG  Tablet, Oral) Active. Sevelamer Carbonate (800MG  Tablet, Oral) Active. Tacrolimus (4MG  Tablet ER 24HR, Oral) Active. Medications Reconciled  Social History Caffeine use  Coffee. No alcohol use  No drug use  Tobacco use  Never smoker.  Family History Family history unknown  First Degree Relatives   Pregnancy / Birth History Age at menarche  68 years. Contraceptive History  Contraceptive  implant. Gravida  0 Irregular periods   Other Problems Chronic Renal Failure Syndrome  High blood pressure  Other disease, cancer, significant illness   Review of Systems General Not Present- Appetite Loss, Chills, Fatigue, Fever, Night Sweats, Weight Gain and Weight Loss. Skin Not Present- Change in Wart/Mole, Dryness, Hives, Jaundice, New Lesions, Non-Healing Wounds, Rash and Ulcer. HEENT Present- Wears glasses/contact lenses. Not Present- Earache, Hearing Loss, Hoarseness, Nose Bleed, Oral Ulcers, Ringing in the Ears, Seasonal Allergies, Sinus Pain, Sore Throat, Visual Disturbances and Yellow Eyes. Respiratory Not Present- Bloody sputum, Chronic Cough, Difficulty Breathing, Snoring and Wheezing. Breast Not Present- Breast Mass, Breast Pain, Nipple Discharge and Skin Changes. Cardiovascular Present- Rapid Heart Rate. Not Present- Chest Pain, Difficulty Breathing Lying Down, Leg Cramps, Palpitations, Shortness of Breath and Swelling of Extremities. Gastrointestinal Not Present- Abdominal Pain, Bloating, Bloody Stool, Change in Bowel Habits, Chronic diarrhea, Constipation, Difficulty Swallowing, Excessive gas, Gets full quickly at meals, Hemorrhoids, Indigestion, Nausea, Rectal Pain and Vomiting. Female Genitourinary Not Present- Frequency, Nocturia, Painful Urination, Pelvic Pain and Urgency. Musculoskeletal Not Present- Back Pain, Joint Pain, Joint Stiffness, Muscle Pain, Muscle Weakness and Swelling of Extremities. Neurological Not Present- Decreased Memory, Fainting, Headaches, Numbness, Seizures, Tingling, Tremor, Trouble walking and Weakness. Psychiatric Not Present- Anxiety, Bipolar, Change in Sleep Pattern, Depression, Fearful and Frequent crying. Endocrine Not Present- Cold Intolerance, Excessive Hunger, Hair Changes, Heat Intolerance, Hot flashes and New Diabetes. Hematology Not Present- Blood Thinners, Easy Bruising, Excessive bleeding, Gland problems, HIV and Persistent  Infections.  Vitals  Weight: 130.2 lb (54th percentile) Height: 65in (61st percentile) Body Surface Area: 1.65 m Body Mass Index: 21.67 kg/m  (49th percentile)  Temp.: 32F(Thermal Scan)  Pulse: 47 (Regular)   Physical Exam  GENERAL APPEARANCE Development: normal Nutritional status: normal Gross deformities: none  SKIN Rash, lesions, ulcers: none Induration,  erythema: none Nodules: none palpable  EYES Conjunctiva and lids: normal Pupils: equal and reactive Iris: normal bilaterally  EARS, NOSE, MOUTH, THROAT External ears: no lesion or deformity External nose: no lesion or deformity Hearing: grossly normal Patient is wearing a mask.  NECK Symmetric: yes Trachea: midline Thyroid: no palpable nodules in the thyroid bed  CHEST Respiratory effort: normal Retraction or accessory muscle use: no Breath sounds: normal bilaterally Rales, rhonchi, wheeze: none Right subclavian dialysis catheter in place with intact dressing.  CARDIOVASCULAR Auscultation: regular rhythm, normal rate Murmurs: none Pulses: carotid and radial pulse 2+ palpable Lower extremity edema: none Lower extremity varicosities: none  MUSCULOSKELETAL Station and gait: normal Digits and nails: no clubbing or cyanosis Muscle strength: grossly normal all extremities Range of motion: grossly normal all extremities Deformity: none  LYMPHATIC Cervical: none palpable Supraclavicular: none palpable  PSYCHIATRIC Oriented to person, place, and time: yes Mood and affect: normal for situation Judgment and insight: appropriate for situation    Assessment & Plan   ESRD (END STAGE RENAL DISEASE) ON DIALYSIS (N18.6) SECONDARY HYPERPARATHYROIDISM, RENAL (N25.81)  Patient is referred by Dr. Edrick Oh for surgical evaluation and management of secondary hyperparathyroidism of renal origin. Patient is provided with written literature on parathyroid surgery to review at home.  Patient  has biochemical evidence of secondary hyperparathyroidism. Intact PTH level is greater than 2500. Patient states that her PTH levels have been significantly elevated for a long time. They apparently were elevated prior to kidney transplantation last year. We discussed total parathyroidectomy with autotransplantation to the forearm. We discussed the risk and benefits of the surgery. We discussed the location of the surgical incisions. We discussed the hospital stay to be anticipated. We discussed her postoperative recovery. The patient understands and wishes to proceed with surgery in the near future.  The risks and benefits of the procedure have been discussed at length with the patient. The patient understands the proposed procedure, potential alternative treatments, and the course of recovery to be expected. All of the patient's questions have been answered at this time. The patient wishes to proceed with surgery.   Armandina Gemma, MD [facility] Office: [phone]

## 2019-09-17 ENCOUNTER — Other Ambulatory Visit: Payer: Self-pay

## 2019-09-18 NOTE — Progress Notes (Signed)
Per anesthesia guidelines pt does not need to be re-tested after testing positive in less than 90 days. VM left for pt with callback number.   Jacqlyn Larsen, RN

## 2019-09-20 ENCOUNTER — Other Ambulatory Visit (HOSPITAL_COMMUNITY)
Admission: RE | Admit: 2019-09-20 | Discharge: 2019-09-20 | Disposition: A | Discharge status: Home / Self Care | Payer: Medicare Other | Source: Ambulatory Visit | Attending: Surgery | Admitting: Surgery

## 2019-09-20 ENCOUNTER — Inpatient Hospital Stay (HOSPITAL_COMMUNITY)
Admission: RE | Admit: 2019-09-20 | Discharge: 2019-09-20 | Disposition: A | Discharge status: Home / Self Care | Payer: Medicare Other | Source: Ambulatory Visit

## 2019-09-20 DIAGNOSIS — Z01812 Encounter for preprocedural laboratory examination: Secondary | ICD-10-CM | POA: Insufficient documentation

## 2019-09-20 DIAGNOSIS — Z20822 Contact with and (suspected) exposure to covid-19: Secondary | ICD-10-CM | POA: Diagnosis not present

## 2019-09-20 NOTE — Progress Notes (Signed)
[facility] [phone] - [place], [place] [address] [place] [address] Phone: [phone] Fax: [phone]      Your procedure is scheduled on Thursday, 09/23/2019.  Report to [facility] Main Entrance "A" at 06:30 A.M., and check in at the Admitting office.  Call this number if you have problems the morning of surgery:  [phone]  Call [phone] if you have any questions prior to your surgery date Monday-Friday 8am-4pm    Remember:  Do not eat or drink after midnight the night before your surgery    Take these medicines the morning of surgery with A SIP OF : Amlodipine (Norvasc) Omeprazole (Prilosec)  7 days prior to surgery STOP taking any Aspirin (unless otherwise instructed by your surgeon), Aleve, Naproxen, Ibuprofen, Motrin, Advil, Goody's, BC's, all herbal medications, fish oil, and all vitamins.    The Morning of Surgery  Do not wear jewelry, make-up or nail polish.  Do not wear lotions, powders, perfumes, or deodorant  Do not shave 48 hours prior to surgery.   Do not bring valuables to the hospital.  [facility] is not responsible for any belongings or valuables.  If you are a smoker, DO NOT Smoke 24 hours prior to surgery  If you wear a CPAP at night please bring your mask, tubing, and machine the morning of surgery   Remember that you must have someone to transport you home after your surgery, and remain with you for 24 hours if you are discharged the same day.   Please bring cases for contacts, glasses, hearing aids, dentures or bridgework because it cannot be worn into surgery.    Leave your suitcase in the car.  After surgery it may be brought to your room.  For patients admitted to the hospital, discharge time will be determined by your treatment team.  Patients discharged the day of surgery will not be allowed to drive home.    Special instructions:   [place]-  Preparing For Surgery  Before surgery, you can play an important role. Because skin is not sterile, your skin needs to be as free of germs as possible. You can reduce the number of germs on your skin by washing with CHG (chlorahexidine gluconate) Soap before surgery.  CHG is an antiseptic cleaner which kills germs and bonds with the skin to continue killing germs even after washing.    Oral Hygiene is also important to reduce your risk of infection.  Remember - BRUSH YOUR TEETH THE MORNING OF SURGERY WITH YOUR REGULAR TOOTHPASTE  Please do not use if you have an allergy to CHG or antibacterial soaps. If your skin becomes reddened/irritated stop using the CHG.  Do not shave (including legs and underarms) for at least 48 hours prior to first CHG shower. It is OK to shave your face.  Please follow these instructions carefully.   1. Shower the NIGHT BEFORE SURGERY and the MORNING OF SURGERY with CHG Soap.   2. If you chose to wash your hair, wash your hair first as usual with your normal shampoo.  3. After you shampoo, rinse your hair and body thoroughly to remove the shampoo.  4. Use CHG as you would any other liquid soap. You can apply CHG directly to the skin and wash gently with a scrungie or a clean washcloth.   5. Apply the CHG Soap to your body ONLY FROM THE NECK DOWN.  Do not use on open wounds or open sores. Avoid contact with your eyes, ears, mouth and genitals (private parts). Wash Face and genitals (private parts)  with your normal soap.   6. Wash thoroughly, paying special attention to the area where your surgery will be performed.  7. Thoroughly rinse your body with warm water from the neck down.  8. DO NOT shower/wash with your normal soap after using and rinsing off the CHG Soap.  9. Pat yourself dry with a CLEAN TOWEL.  10. Wear CLEAN PAJAMAS to bed the night before surgery, wear comfortable clothes the morning of surgery  11. Place CLEAN SHEETS on your bed the night of  your first shower and DO NOT SLEEP WITH PETS.    Day of Surgery:  Please shower the morning of surgery with the CHG soap Do not apply any deodorants/lotions. Please wear clean clothes to the hospital/surgery center.   Remember to brush your teeth WITH YOUR REGULAR TOOTHPASTE.   Please read over the following fact sheets that you were given.

## 2019-09-21 ENCOUNTER — Other Ambulatory Visit: Payer: Self-pay

## 2019-09-21 ENCOUNTER — Encounter (HOSPITAL_COMMUNITY): Payer: Self-pay | Admitting: Physician Assistant

## 2019-09-21 ENCOUNTER — Encounter (HOSPITAL_COMMUNITY)
Admission: RE | Admit: 2019-09-21 | Discharge: 2019-09-21 | Disposition: A | Discharge status: Home / Self Care | Payer: Medicare Other | Source: Ambulatory Visit | Attending: Surgery | Admitting: Surgery

## 2019-09-21 ENCOUNTER — Encounter (HOSPITAL_COMMUNITY): Payer: Self-pay

## 2019-09-21 DIAGNOSIS — I371 Nonrheumatic pulmonary valve insufficiency: Secondary | ICD-10-CM | POA: Diagnosis not present

## 2019-09-21 DIAGNOSIS — N186 End stage renal disease: Secondary | ICD-10-CM | POA: Diagnosis not present

## 2019-09-21 DIAGNOSIS — I340 Nonrheumatic mitral (valve) insufficiency: Secondary | ICD-10-CM | POA: Insufficient documentation

## 2019-09-21 DIAGNOSIS — Z01812 Encounter for preprocedural laboratory examination: Secondary | ICD-10-CM | POA: Insufficient documentation

## 2019-09-21 LAB — BASIC METABOLIC PANEL
Anion gap: 15 (ref 5–15)
BUN: 22 mg/dL — ABNORMAL HIGH (ref 6–20)
CO2: 27 mmol/L (ref 22–32)
Calcium: 7.9 mg/dL — ABNORMAL LOW (ref 8.9–10.3)
Chloride: 98 mmol/L (ref 98–111)
Creatinine, Ser: 7.38 mg/dL — ABNORMAL HIGH (ref 0.44–1.00)
GFR calc Af Amer: 8 mL/min — ABNORMAL LOW (ref >60)
GFR calc non Af Amer: 7 mL/min — ABNORMAL LOW (ref >60)
Glucose, Bld: 117 mg/dL — ABNORMAL HIGH (ref 70–99)
Potassium: 3.5 mmol/L (ref 3.5–5.1)
Sodium: 140 mmol/L (ref 135–145)

## 2019-09-21 LAB — NOVEL CORONAVIRUS, NAA (HOSP ORDER, SEND-OUT TO REF LAB; TAT 18-24 HRS): SARS-CoV-2, NAA: NOT DETECTED

## 2019-09-21 LAB — CBC
HCT: 31 % — ABNORMAL LOW (ref 36.0–46.0)
Hemoglobin: 9.1 g/dL — ABNORMAL LOW (ref 12.0–15.0)
MCH: 28.1 pg (ref 26.0–34.0)
MCHC: 29.4 g/dL — ABNORMAL LOW (ref 30.0–36.0)
MCV: 95.7 fL (ref 80.0–100.0)
Platelets: 132 10*3/uL — ABNORMAL LOW (ref 150–400)
RBC: 3.24 MIL/uL — ABNORMAL LOW (ref 3.87–5.11)
RDW: 18.4 % — ABNORMAL HIGH (ref 11.5–15.5)
WBC: 5.1 10*3/uL (ref 4.0–10.5)
nRBC: 0 % (ref 0.0–0.2)

## 2019-09-21 NOTE — Progress Notes (Signed)
[facility] [phone] - [place], [place] - [address] [place] [address] Phone: [phone] Fax: [phone]      Your procedure is scheduled on Thursday, 09/23/2019.  Report to [facility] Main Entrance "A" at 06:30 A.M., and check in at the Admitting office.  Call this number if you have problems the morning of surgery:  [phone]  Call [phone] if you have any questions prior to your surgery date Monday-Friday 8am-4pm    Remember:  Do not eat or drink after midnight the night before your surgery    Take these medicines the morning of surgery with A SIP OF : Amlodipine (Norvasc) Omeprazole (Prilosec) ENVARSUS XR  7 days prior to surgery STOP taking any Aspirin (unless otherwise instructed by your surgeon), Aleve, Naproxen, Ibuprofen, Motrin, Advil, Goody's, BC's, all herbal medications, fish oil, and all vitamins.    The Morning of Surgery  Do not wear jewelry, make-up or nail polish.  Do not wear lotions, powders, perfumes, or deodorant  Do not shave 48 hours prior to surgery.   Do not bring valuables to the hospital.  [facility] is not responsible for any belongings or valuables.  If you are a smoker, DO NOT Smoke 24 hours prior to surgery  If you wear a CPAP at night please bring your mask, tubing, and machine the morning of surgery   Remember that you must have someone to transport you home after your surgery, and remain with you for 24 hours if you are discharged the same day.   Please bring cases for contacts, glasses, hearing aids, dentures or bridgework because it cannot be worn into surgery.    Leave your suitcase in the car.  After surgery it may be brought to your room.  For patients admitted to the hospital, discharge time will be determined by your treatment team.  Patients discharged the day of surgery will not be allowed to drive home.    Special instructions:    [place]- Preparing For Surgery  Before surgery, you can play an important role. Because skin is not sterile, your skin needs to be as free of germs as possible. You can reduce the number of germs on your skin by washing with CHG (chlorahexidine gluconate) Soap before surgery.  CHG is an antiseptic cleaner which kills germs and bonds with the skin to continue killing germs even after washing.    Oral Hygiene is also important to reduce your risk of infection.  Remember - BRUSH YOUR TEETH THE MORNING OF SURGERY WITH YOUR REGULAR TOOTHPASTE  Please do not use if you have an allergy to CHG or antibacterial soaps. If your skin becomes reddened/irritated stop using the CHG.  Do not shave (including legs and underarms) for at least 48 hours prior to first CHG shower. It is OK to shave your face.  Please follow these instructions carefully.   1. Shower the NIGHT BEFORE SURGERY and the MORNING OF SURGERY with CHG Soap.   2. If you chose to wash your hair, wash your hair first as usual with your normal shampoo.  3. After you shampoo, rinse your hair and body thoroughly to remove the shampoo.  4. Use CHG as you would any other liquid soap. You can apply CHG directly to the skin and wash gently with a scrungie or a clean washcloth.   5. Apply the CHG Soap to your body ONLY FROM THE NECK  DOWN.  Do not use on open wounds or open sores. Avoid contact with your eyes, ears, mouth and genitals (private parts). Wash Face and genitals (private parts)  with your normal soap.   6. Wash thoroughly, paying special attention to the area where your surgery will be performed.  7. Thoroughly rinse your body with warm water from the neck down.  8. DO NOT shower/wash with your normal soap after using and rinsing off the CHG Soap.  9. Pat yourself dry with a CLEAN TOWEL.  10. Wear CLEAN PAJAMAS to bed the night before surgery, wear comfortable clothes the morning of surgery  11. Place CLEAN SHEETS on your bed  the night of your first shower and DO NOT SLEEP WITH PETS.    Day of Surgery:  Please shower the morning of surgery with the CHG soap Do not apply any deodorants/lotions. Please wear clean clothes to the hospital/surgery center.   Remember to brush your teeth WITH YOUR REGULAR TOOTHPASTE.   Please read over the following fact sheets that you were given.

## 2019-09-21 NOTE — Progress Notes (Signed)
PCP:  Caro Hight, MD Cardiologist:  Denies  EKG:  08/23/19 CXR:  N/A ECHO:  08/23/19 Stress Test:  Denies Cardiac Cath:  Denies  Covid test 09/20/19  Patient denies shortness of breath, fever, cough, and chest pain at PAT appointment.  Patient verbalized understanding of instructions provided today at the PAT appointment.  Patient asked to review instructions at home and day of surgery.

## 2019-09-22 ENCOUNTER — Telehealth: Payer: Self-pay | Admitting: *Deleted

## 2019-09-22 ENCOUNTER — Telehealth: Payer: Self-pay

## 2019-09-22 ENCOUNTER — Encounter (HOSPITAL_COMMUNITY): Payer: Self-pay | Admitting: Surgery

## 2019-09-22 NOTE — Telephone Encounter (Signed)
Called the patient to inform her that she would need an appointment for cardiac clearance before her surgery. While speaking with the patient we were disconnected tried calling the patient back and left her a voice message informing her to call the office back for an appointment for pre-op clearance.

## 2019-09-22 NOTE — Telephone Encounter (Signed)
   [facility] Pre-operative Risk Assessment    Request for surgical clearance: PT HAS ONLY EVER BEEN SEEN IN THE HOSPITAL. SEE CONSULT NOTE 08/23/19 AND PROGRESS NOTE 08/24/19 FROM DR. NISHAN. I DO NOT SEE THAT THE PT HAS BEEN ADVISED TO FOLLOW UP WITH CARDIOLOGY.   1. What type of surgery is being performed? TOTAL PARATHYROIDECTOMY w/AUTOTRANSPLANT TO LEFT FOREARM   2. When is this surgery scheduled? 09/23/19   3. What type of clearance is required (medical clearance vs. Pharmacy clearance to hold med vs. Both)?   4. Are there any medications that need to be held prior to surgery and how long?MEDICAL   5. Practice name and name of physician performing surgery? [facility]; DR. Sherren Mocha GERKIN   6. What is your office phone number [phone]    7.   What is your office fax number [phone] ATTN: Mammie Lorenzo, LPN  8.   Anesthesia type (None, local, MAC, general) ? GENERAL   Julaine Hua 09/22/2019, 1:41 PM  _________________________________________________________________   (provider comments below)

## 2019-09-22 NOTE — Telephone Encounter (Signed)
   Primary Cardiologist:No primary care provider on file.  Chart reviewed as part of pre-operative protocol coverage. Patient recently seen in the hospital by Dr. Johnsie Cancel for new diagnosed cardiomyopathy in the setting of COVID. She left the ED AMA. Therefore, she will require a follow-up visit in order to better assess preoperative cardiovascular risk.  Pre-op covering staff: - Please schedule appointment and call patient to inform them. - Please contact requesting surgeon's office via preferred method (i.e, phone, fax) to inform them of need for appointment prior to surgery. Surgery is scheduled for tomorrow. Please call their office before 3pm to notify them of need to postpone surgery.   If applicable, this message will also be routed to pharmacy pool and/or primary cardiologist for input on holding anticoagulant/antiplatelet agent as requested below so that this information is available at time of patient's appointment.   Darreld Mclean, PA-C  09/22/2019, 2:18 PM

## 2019-09-22 NOTE — Telephone Encounter (Signed)
I s/w Sande Rives, PAC in regards to pt's upcoming surgery. Pt will need appt per Pre op team. I asked Sande Rives, PAC to please have the pre op call back person today call the pt and make appt and I will call the surgeons office and let them know pt is needing an appt surgery clearance. I s/w Mammie Lorenzo, LPN at [facility] and informed there that pt will need pre op appt. We have only seen this pt in the hospital and not in the office at this time.

## 2019-09-22 NOTE — Progress Notes (Signed)
Anesthesia Chart Review:  ESRD on HD secondary to [place] Syndrome. 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 Dialysis dependent with third rejection episode. She was deemed ESRD on 02/08/2019 currently on HD. 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 Allograft nephrectomy 04/20/2019. Per last transplant note 05/13/19, pt doing well s/p allograft nephrectomy. She continues on immunosuppressant therapy per transplant team.   Hospitalized 08/23/19 for SOB. Positive Covid test 08/23/19.  2D echo 12/14 showed LVEF 25 to 30% with moderate MR and moderate pericardial effusion, a new diagnosis for her.  Not clear if related to hypertension heart disease or COVID. Patient ultimately left AMA and it does not appear she has had any cardiology followup.  Called [facility] and spoke with Sunday Spillers to advise that due to pt new diagnosis of severely decreased LV function, cardiology input needed prior to surgery. Per cardiology not in [place], pt will need to be seen to be evaluated. Case cancelled at this time.    TTE 08/23/19:  1. Left ventricular ejection fraction, by visual estimation, is 25 to 30%. The left ventricle has severely decreased function. There is no left ventricular hypertrophy.  2. The left ventricle demonstrates global hypokinesis.  3. Global right ventricle has mildly reduced systolic function.The right ventricular size is normal. No increase in right ventricular wall thickness.  4. Left atrial size was severely dilated.  5. Right atrial size was normal.  6. Moderate pericardial effusion.  7. Pericardial effusion does not appear to be causing hemodynamic compromise by echo criteria. CLinical correlation indicated.  8. The mitral valve is normal in structure. Moderate mitral valve regurgitation.  9. The tricuspid valve is normal in structure. Tricuspid valve regurgitation is trivial. 10. The aortic valve is normal in  structure. Aortic valve regurgitation is trivial. 11. Pulmonic regurgitation is mild. 12. The pulmonic valve was normal in structure. Pulmonic valve regurgitation is mild. 13. Moderately elevated pulmonary artery systolic pressure. 14. The inferior vena cava is normal in size with <50% respiratory variability, suggesting right atrial pressure of 8 mmHg.

## 2019-09-23 ENCOUNTER — Inpatient Hospital Stay (HOSPITAL_COMMUNITY): Admission: RE | Admit: 2019-09-23 | Payer: Medicare Other | Source: Home / Self Care | Admitting: Surgery

## 2019-09-23 SURGERY — PARATHYROIDECTOMY
Anesthesia: General | Laterality: Left

## 2019-10-05 ENCOUNTER — Telehealth: Payer: Self-pay

## 2019-10-05 ENCOUNTER — Telehealth: Payer: Self-pay | Admitting: *Deleted

## 2019-10-05 ENCOUNTER — Encounter (HOSPITAL_COMMUNITY): Payer: Self-pay | Admitting: Physician Assistant

## 2019-10-05 ENCOUNTER — Encounter (HOSPITAL_COMMUNITY): Payer: Self-pay | Admitting: Vascular Surgery

## 2019-10-05 ENCOUNTER — Other Ambulatory Visit: Payer: Self-pay

## 2019-10-05 ENCOUNTER — Other Ambulatory Visit (HOSPITAL_COMMUNITY)
Admission: RE | Admit: 2019-10-05 | Discharge: 2019-10-05 | Disposition: A | Discharge status: Home / Self Care | Payer: Medicare Other | Source: Ambulatory Visit | Attending: Vascular Surgery | Admitting: Vascular Surgery

## 2019-10-05 ENCOUNTER — Other Ambulatory Visit (HOSPITAL_COMMUNITY): Payer: Medicare Other

## 2019-10-05 DIAGNOSIS — I313 Pericardial effusion (noninflammatory): Secondary | ICD-10-CM | POA: Diagnosis not present

## 2019-10-05 DIAGNOSIS — I3139 Other pericardial effusion (noninflammatory): Secondary | ICD-10-CM

## 2019-10-05 DIAGNOSIS — Z01812 Encounter for preprocedural laboratory examination: Secondary | ICD-10-CM | POA: Insufficient documentation

## 2019-10-05 DIAGNOSIS — I420 Dilated cardiomyopathy: Secondary | ICD-10-CM

## 2019-10-05 DIAGNOSIS — I10 Essential (primary) hypertension: Secondary | ICD-10-CM

## 2019-10-05 DIAGNOSIS — Z20822 Contact with and (suspected) exposure to covid-19: Secondary | ICD-10-CM | POA: Insufficient documentation

## 2019-10-05 DIAGNOSIS — Z01818 Encounter for other preprocedural examination: Secondary | ICD-10-CM

## 2019-10-05 LAB — SARS CORONAVIRUS 2 (TAT 6-24 HRS): SARS Coronavirus 2: NEGATIVE

## 2019-10-05 NOTE — Telephone Encounter (Signed)
-----   Message from Josue Hector, MD sent at 10/05/2019  3:22 PM EST ----- Needs echo to clear for surgery ? Can we do tomorrow she was added on to my schedule first thing in am ?  DCM/moderate pericardial effusion

## 2019-10-05 NOTE — Telephone Encounter (Signed)
Request for Surgical Clearance  1. What type of surgery is being performed?  LEFT ARM ARTERIOVENOUS FISTULA VERSUS GRAFT    2. When is this surgery scheduled? 10/07/2019  3. What type of clearance is required (medical clearance vs. Pharmacy clearance to hold med vs. Both)? CARDIAC CLEARANCE    4. Are there any medications that need to be held prior to surgery and how long?  NONE    5. Practice name and name of physician performing surgery?   DR. TODD E[facility]    6.  What is your office phone number? [phone]    7. What is your office fax number? (Be sure to include anyone who it needs to go Attn to)    [phone] ATT [place] 8. Anesthesia type (None, local, MAC, general)? CHOICE.   REMINDER TO USER: Remember to please route this message to P CV DIV PREOP in a phone note.

## 2019-10-05 NOTE — Progress Notes (Addendum)
CARDIOLOGY CONSULT NOTE       Patient ID: Dominique Lawrence MRN: [id number] DOB/AGE: 30-Mar-2001 19 y.o.  Admit date: (Not on file) Referring Physician: Gerkin Primary Physician: Carmie End, MD Primary Cardiologist: New Reason for Consultation: Preoperative Clearance   Active Problems:   * No active hospital problems. *   HPI:  19 y.o. CRF since age 27  Alport Syndrome with dialysis . 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 NowWith secondary hyperparathyroidism. Needing total parathyroidectomy. She is followed by Dr Web for her CRF Hillery Aldo needs left arm AV fistula with Dr Donnetta Hutching Tentatively scheduled for 10/07/19 Current dialysis access is with right IJ tunneled catherter She was seen at [facility] ER 08/23/19 but left AMA She was COVID positive with dyspnea Echo showed EF 25-30% with moderate pericardial effusion , moderate TR/MRHer troponin was negative She was started on hydralazine and coreg continued Lasix dose and dialysis per renal Was not particularly volume overloaded She received decadron and remdesivir    In office today she denies dyspnea, palpitations or chest pain but her BP is up and she is tachycardic Discussed need to cancel surgery in am until we can reassess her heart. She has dialysis today and will try to get to [facility] by 3:30 to have echo Spoke with Dr Early personally and hopefully can review echo at 4:30 today and make a decision about surgery for tomorrow afternoon   Also spoke with Dialysis center to get her in early today so she won't miss her echo appointment Did not have any coreg to give her in office but hopefully will get a dose In tonight and in am    ROS All other systems reviewed and negative except as noted above  Past Medical History:  Diagnosis Date  . Alport syndrome   . Alport syndrome   . Anemia   . Constipation 12/18/2013  . Depression 07/28/2013  . ESRD (end stage renal disease) ([place])     Transplant  failed Hemo MWF at [facility]  Marland Kitchen History of blood transfusion   . Hypertension   . Influenza B   . S/P wisdom tooth extraction 06/22/2016   Right mandibular dental pain  . Sinus tachycardia     Family History  Problem Relation Age of Onset  . Alport syndrome Brother     Social History   Socioeconomic History  . Marital status: Married    Spouse name: Not on file  . Number of children: Not on file  . Years of education: Not on file  . Highest education level: Not on file  Occupational History  . Not on file  Tobacco Use  . Smoking status: Never Smoker  . Smokeless tobacco: Never Used  Substance and Sexual Activity  . Alcohol use: No  . Drug use: No  . Sexual activity: Not Currently    Birth control/protection: Implant  Other Topics Concern  . Not on file  Social History Narrative   Lives with Mom, Sister, & 2 Brothers. No pets at home. No one smokes.   Social Determinants of Health   Financial Resource Strain:   . Difficulty of Paying Living Expenses: Not on file  Food Insecurity:   . Worried About Charity fundraiser in the Last Year: Not on file  . Ran Out of Food in the Last Year: Not on file  Transportation Needs:   . Lack of Transportation (Medical): Not on file  . Lack of Transportation (Non-Medical):  Not on file  Physical Activity:   . Days of Exercise per Week: Not on file  . Minutes of Exercise per Session: Not on file  Stress:   . Feeling of Stress : Not on file  Social Connections:   . Frequency of Communication with Friends and Family: Not on file  . Frequency of Social Gatherings with Friends and Family: Not on file  . Attends Religious Services: Not on file  . Active Member of Clubs or Organizations: Not on file  . Attends Archivist Meetings: Not on file  . Marital Status: Not on file  Intimate Partner Violence:   . Fear of Current or Ex-Partner: Not on file  . Emotionally Abused: Not on file  . Physically  Abused: Not on file  . Sexually Abused: Not on file    Past Surgical History:  Procedure Laterality Date  . Hemodialysis catheter placed Right   . 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 NEPHRECTOMY Right       Current Outpatient Medications:  .  acetaminophen (TYLENOL) 500 MG tablet, Take 500 mg by mouth every 6 (six) hours as needed for moderate pain or headache., Disp: , Rfl:  .  amLODipine (NORVASC) 10 MG tablet, Take 10 mg by mouth daily., Disp: , Rfl:  .  ENVARSUS XR 1 MG TB24, Take 1 mg by mouth daily., Disp: , Rfl:  .  etonogestrel (NEXPLANON) 68 MG IMPL implant, 68 mg by Subdermal route once. Jan 2018, Disp: , Rfl:  .  furosemide (LASIX) 40 MG tablet, Take 40 mg by mouth 2 (two) times a week. , Disp: , Rfl:  .  omeprazole (PRILOSEC) 20 MG capsule, Take 20 mg by mouth daily. , Disp: , Rfl:  .  sevelamer carbonate (RENVELA) 800 MG tablet, Take 800 mg by mouth 3 (three) times daily with meals., Disp: , Rfl:  .  sulfamethoxazole-trimethoprim (BACTRIM) 400-80 MG tablet, Take 1 tablet by mouth every Monday, Wednesday, and Friday. , Disp: , Rfl:  .  carvedilol (COREG) 6.25 MG tablet, Take 1 tablet (6.25 mg total) by mouth 2 (two) times daily., Disp: 180 tablet, Rfl: 3    Physical Exam: Blood pressure (!) 160/118, pulse (!) 117, height 5\' 4"  (1.626 m), weight 128 lb 6.4 oz (58.2 kg), SpO2 94 %.    Affect appropriate Chronically ill [place] female  HEENT: normal Neck right IJ tunneled catheter  JVP normal no bruits no thyromegaly Lungs clear with no wheezing and good diaphragmatic motion Heart:  S1/S2 MR  murmur, no rub, gallop or click PMI enlarged  Abdomen: benighn, BS positve, no tenderness, no AAA no bruit.  No HSM or HJR Distal pulses intact with no bruits No edema Neuro non-focal Skin warm and dry No muscular weakness  Labs:   Lab Results  Component Value Date   WBC 5.1 09/21/2019   HGB 9.1 (L) 09/21/2019   HCT 31.0 (L) 09/21/2019   MCV 95.7 09/21/2019   PLT 132 (L)  09/21/2019   No results for input(s): NA, K, CL, CO2, BUN, CREATININE, CALCIUM, PROT, BILITOT, ALKPHOS, ALT, AST, GLUCOSE in the last 168 hours.  Invalid input(s): LABALBU No results found for: CKTOTAL, CKMB, CKMBINDEX, TROPONINI  Lab Results  Component Value Date   CHOL 157 01/06/2014   CHOL 190 (H) 07/28/2013   Lab Results  Component Value Date   HDL 38 01/06/2014   HDL 39 07/28/2013   Lab Results  Component Value Date   LDLCALC 74 01/06/2014  Lab Results  Component Value Date   TRIG 224 (H) 01/06/2014   Lab Results  Component Value Date   CHOLHDL 4.1 01/06/2014   No results found for: LDLDIRECT    Radiology: VAS [place] UPPER EXTREMITY ARTERIAL DUPLEX  Result Date: 09/14/2019 UPPER EXTREMITY DUPLEX STUDY Indications: Patient complains of Evaluation prior to placement of dialysis              access.  Risk Factors:  Hypertension, no history of smoking. Other Factors: Alport syndrome Performing Technologist: Delorise Shiner RVT  Examination Guidelines: A complete evaluation includes B-mode imaging, spectral Doppler, color Doppler, and power Doppler as needed of all accessible portions of each vessel. Bilateral testing is considered an integral part of a complete examination. Limited examinations for reoccurring indications may be performed as noted.  Right Pre-Dialysis Findings: +-----------------------+----------+--------------------+---------+--------+ Location               PSV (cm/s)Intralum. Diam. (cm)Waveform Comments +-----------------------+----------+--------------------+---------+--------+ Brachial Antecub. fossa58        0.41                triphasic         +-----------------------+----------+--------------------+---------+--------+ Radial Art at Wrist    33        0.18                triphasic         +-----------------------+----------+--------------------+---------+--------+ Ulnar Art at Wrist     21        0.22                triphasic          +-----------------------+----------+--------------------+---------+--------+ Left Pre-Dialysis Findings: +-----------------------+----------+--------------------+---------+--------+ Location               PSV (cm/s)Intralum. Diam. (cm)Waveform Comments +-----------------------+----------+--------------------+---------+--------+ Brachial Antecub. fossa62        0.38                triphasic         +-----------------------+----------+--------------------+---------+--------+ Radial Art at Wrist    33        0.21                triphasic         +-----------------------+----------+--------------------+---------+--------+ Ulnar Art at Wrist     20        0.38                triphasic         +-----------------------+----------+--------------------+---------+--------+  Summary:  Right: No obstruction visualized in the right upper extremity. Left: No obstruction visualized in the left upper extremity. *See table(s) above for measurements and observations. Electronically signed by Curt Jews MD on 09/14/2019 at 2:08:51 PM.    Final    VAS [place] UPPER EXTREMITY VEIN MAPPING  Result Date: 09/14/2019 UPPER EXTREMITY VEIN MAPPING  Indications: Evaluation prior to placement of dialysis access. History: Alport's syndrome.  Performing Technologist: Delorise Shiner RVT  Examination Guidelines: A complete evaluation includes B-mode imaging, spectral Doppler, color Doppler, and power Doppler as needed of all accessible portions of each vessel. Bilateral testing is considered an integral part of a complete examination. Limited examinations for reoccurring indications may be performed as noted. +--------------+-------------+----------+--------------------------+ Right CephalicDiameter (cm)Depth (cm)         Findings          +--------------+-------------+----------+--------------------------+ Shoulder          0.24                                           +--------------+-------------+----------+--------------------------+  Prox upper arm    0.19                                          +--------------+-------------+----------+--------------------------+ Mid upper arm     0.30                                          +--------------+-------------+----------+--------------------------+ Dist upper arm                       non compressible acute SVT +--------------+-------------+----------+--------------------------+ +-----------------+-------------+----------+--------+ Right Basilic    Diameter (cm)Depth (cm)Findings +-----------------+-------------+----------+--------+ Prox upper arm       0.14                        +-----------------+-------------+----------+--------+ Mid upper arm        0.19                        +-----------------+-------------+----------+--------+ Dist upper arm       0.20                        +-----------------+-------------+----------+--------+ Antecubital fossa    0.19                        +-----------------+-------------+----------+--------+ Prox forearm         0.15                        +-----------------+-------------+----------+--------+ Acute superficial thrombus seen in the distal upper arm segment of the cephalic vein. +-----------------+-------------+----------+----------------+ Left Cephalic    Diameter (cm)Depth (cm)    Findings     +-----------------+-------------+----------+----------------+ Shoulder             0.18                                +-----------------+-------------+----------+----------------+ Prox upper arm       0.19                                +-----------------+-------------+----------+----------------+ Mid upper arm        0.17                                +-----------------+-------------+----------+----------------+ Dist upper arm       0.25                                 +-----------------+-------------+----------+----------------+ Antecubital fossa    0.58                                +-----------------+-------------+----------+----------------+ Prox forearm         0.16                                +-----------------+-------------+----------+----------------+ Mid forearm  0.14               chronic thrombus +-----------------+-------------+----------+----------------+ Dist forearm         0.13                                +-----------------+-------------+----------+----------------+ +-----------------+-------------+----------+--------+ Left Basilic     Diameter (cm)Depth (cm)Findings +-----------------+-------------+----------+--------+ Prox upper arm       0.31                        +-----------------+-------------+----------+--------+ Mid upper arm        0.26                        +-----------------+-------------+----------+--------+ Dist upper arm       0.13                        +-----------------+-------------+----------+--------+ Antecubital fossa    0.18                        +-----------------+-------------+----------+--------+ Prox forearm         0.18                        +-----------------+-------------+----------+--------+ Chronic thrombus seen in the mid forearm segment of the cephalic vein Summary: Right: Cephalic and basilic veins were identified with diameters as        described above.        Acute superficial thrombus seen in the distal upper arm        segment of the cephalic vein. Left: Cephalic and basilic veins were identified with diameters as       described above.       Chronic thrombus seen in the mid forearm segment of the       cephalic vein. *See table(s) above for measurements and observations.   Diagnosing physician: Curt Jews MD Electronically signed by Curt Jews MD on 09/14/2019 at 2:18:13 PM.    Final     EKG: SR rate 112 normal    ASSESSMENT AND PLAN:   1. DCM:  ?  Related to poorly controlled BP, vs COVID. Minimal troponin elevation and no acute ECG changes will need repeat echo try to get done today continue Lasix dose per renal and dialysis as she is not anuric. Continue hydralazine and add coreg 6.25 bid   2. Moderate Pericardial Effusion:  See above no tamponade on TTE 08/23/19 ? Related to uremia/CRF vs COVID echo today if not larger and no tamponade can try to have surgery in am 3. Valve Disease:  Will need to follow for moderate MR/TR no need for SBE 4. CRF:  So long as pericardial effusion is not worse/ tamponade she should be able to proceed with fistula placement Given small stature and likely veins will likely need graft in upper arm F/U Dr [employer] and Early 5. HPT:  F/u with Dr Harlow Asa ok to proceed with neck surgery after dialysis access   Will see in 3 weeks to further adjust meds for DCM   Signed: Jenkins Rouge 10/06/2019, 8:58 AM   Patient had echo done at [facility] 3:30 pm Reviewed EF still bad 30-35% Effusion much bigger with dilated IVC swinging heart and likely early tamponade. Have discussed with Dr Donnetta Hutching to cancel fistula surgery in am.  Patient had dialysis today. Have discussed with Dr Jeffie Pollock to see in CHF clinic as soon as possible to arrange right heart cath to confirm tamponade and help guide CHF RX Most of the effusion is posterior and will likely need pericardial window with CVTS To call patient once arrangements made today. Clinically she was very stable in clinic today and denied syncope, chest pain, or dyspnea She was tachycardic.   Jenkins Rouge MD [facility]

## 2019-10-05 NOTE — Telephone Encounter (Signed)
Placed order for echo. Will send message to scheduling to help with appt.

## 2019-10-05 NOTE — Telephone Encounter (Signed)
Request for Surgical Clearance  1. What type of surgery is being performed?  left arm arteriovenous fistula versus insertion of graft for hemodialysis.    2. When is this surgery scheduled? 10/07/2019  3. What type of clearance is required (medical clearance vs. Pharmacy clearance to hold med vs. Both)?    Cardiac clearance  4. Are there any medications that need to be held prior to surgery and how long?  none    5. Practice name and name of physician performing surgery?   [facility], Dr. Sherren Mocha Early    6.  What is your office phone number? [phone]    7. What is your office fax number? (Be sure to include anyone who it needs to go Attn to) [phone] atten. Shanon or Becky    8. Anesthesia type (None, local, MAC, general)? Choice   REMINDER TO USER: Remember to please route this message to P CV DIV PREOP in a phone note.

## 2019-10-05 NOTE — Progress Notes (Addendum)
Mrs Dominique Lawrence was tested for Covid today and is quaranting at home with family patient denies chest pain or shortness of breath.   Mrs Dominique Lawrence was hospitalized in December 2020 with shortness of breath, was Covid positive.  Ms Dominique Lawrence had an echo on 08/23/2019.  ECHO showed EF of 25 to 30. Patient was seen by Dr Johnsie Cancel.  Mrs Dominique Lawrence was scheduled for surgery to have thyroid surgery , it was cancelled until patient has cardiac clearance. I asked Mrs Dominique Lawrence if she saw a cardiologist after hospitalization ;she said "no, they called while I was on dialysis and left a message, when I called back I was told that I have to have a referral.  I spoke with Geraldo Pitter, a nurse with [facility] said she has an opening for 10/06/2019 at 0800. I told Selena that I do not know what time patient has dialysis 1/27, Selena will call patient.  I called Becky at [facility] and let her know that [facility] is trying to see patient tomorrow. Karoline Caldwell PA-C has reviewed chart and following. I spoke with Mrs Dominique Lawrence and asked what time she has dialysis- she said 11:30, she said she will have cardiac appointment at 0800 and dialysis at 1130.

## 2019-10-06 ENCOUNTER — Telehealth: Payer: Self-pay

## 2019-10-06 ENCOUNTER — Other Ambulatory Visit: Payer: Self-pay | Admitting: Cardiovascular Disease

## 2019-10-06 ENCOUNTER — Other Ambulatory Visit: Payer: Self-pay

## 2019-10-06 ENCOUNTER — Ambulatory Visit (HOSPITAL_BASED_OUTPATIENT_CLINIC_OR_DEPARTMENT_OTHER)
Admission: RE | Admit: 2019-10-06 | Discharge: 2019-10-06 | Disposition: A | Discharge status: Home / Self Care | Payer: Medicare Other | Source: Ambulatory Visit | Attending: Cardiovascular Disease | Admitting: Cardiovascular Disease

## 2019-10-06 ENCOUNTER — Encounter: Payer: Self-pay | Admitting: Cardiovascular Disease

## 2019-10-06 ENCOUNTER — Ambulatory Visit (INDEPENDENT_AMBULATORY_CARE_PROVIDER_SITE_OTHER): Payer: Medicare Other | Admitting: Cardiovascular Disease

## 2019-10-06 VITALS — BP 160/118 | HR 117 | Ht 64.0 in | Wt 128.4 lb

## 2019-10-06 DIAGNOSIS — I313 Pericardial effusion (noninflammatory): Secondary | ICD-10-CM

## 2019-10-06 DIAGNOSIS — I3139 Other pericardial effusion (noninflammatory): Secondary | ICD-10-CM

## 2019-10-06 DIAGNOSIS — R931 Abnormal findings on diagnostic imaging of heart and coronary circulation: Secondary | ICD-10-CM

## 2019-10-06 DIAGNOSIS — Z01810 Encounter for preprocedural cardiovascular examination: Secondary | ICD-10-CM

## 2019-10-06 DIAGNOSIS — Z01818 Encounter for other preprocedural examination: Secondary | ICD-10-CM | POA: Diagnosis not present

## 2019-10-06 DIAGNOSIS — I420 Dilated cardiomyopathy: Secondary | ICD-10-CM

## 2019-10-06 DIAGNOSIS — N189 Chronic kidney disease, unspecified: Secondary | ICD-10-CM | POA: Insufficient documentation

## 2019-10-06 DIAGNOSIS — N186 End stage renal disease: Secondary | ICD-10-CM | POA: Insufficient documentation

## 2019-10-06 DIAGNOSIS — I081 Rheumatic disorders of both mitral and tricuspid valves: Secondary | ICD-10-CM | POA: Insufficient documentation

## 2019-10-06 DIAGNOSIS — I314 Cardiac tamponade: Secondary | ICD-10-CM

## 2019-10-06 DIAGNOSIS — I1311 Hypertensive heart and chronic kidney disease without heart failure, with stage 5 chronic kidney disease, or end stage renal disease: Secondary | ICD-10-CM | POA: Insufficient documentation

## 2019-10-06 LAB — ECHOCARDIOGRAM COMPLETE
Height: 64 in
Weight: 2054.4 oz

## 2019-10-06 MED ORDER — CARVEDILOL 6.25 MG PO TABS: 6.2500 mg | ORAL_TABLET | Freq: Two times a day (BID) | ORAL | 3 refills | Status: DC

## 2019-10-06 MED ORDER — SODIUM CHLORIDE 0.9% FLUSH: 3.0000 mL | Freq: Two times a day (BID) | INTRAVENOUS | Status: DC

## 2019-10-06 NOTE — Telephone Encounter (Signed)
D/w Dr. Johnsie Cancel.   Will send to ER in am for admission for RHC and possible pericardial window with CT surgery  Glori Bickers, MD  5:09 PM

## 2019-10-06 NOTE — Telephone Encounter (Signed)
Called patient informed her to go to ED in the morning to be admitted by Dr. Haroldine Laws. Informed patient to not eat or drink after midnight. Patient verbalized understanding and will go to ED in the am.

## 2019-10-06 NOTE — Patient Instructions (Addendum)
Medication Instructions:  Your physician has recommended you make the following change in your medication- 1-START Carvedilol 6.25 mg by mouth twice daily  *If you need a refill on your cardiac medications before your next appointment, please call your pharmacy*  Lab Work: If you have labs (blood work) drawn today and your tests are completely normal, you will receive your results only by: Marland Kitchen MyChart Message (if you have MyChart) OR . A paper copy in the mail If you have any lab test that is abnormal or we need to change your treatment, we will call you to review the results.  Testing/Procedures: Your physician has requested that you have an echocardiogram today. Echocardiography is a painless test that uses sound waves to create images of your heart. It provides your doctor with information about the size and shape of your heart and how well your heart's chambers and valves are working. This procedure takes approximately one hour. There are no restrictions for this procedure.  Follow-Up: At [facility], you and your health needs are our priority.  As part of our continuing mission to provide you with exceptional heart care, we have created designated Provider Care Teams.  These Care Teams include your primary Cardiologist (physician) and Advanced Practice Providers (APPs -  Physician Assistants and Nurse Practitioners) who all work together to provide you with the care you need, when you need it.  Your next appointment:   3 week(s)  The format for your next appointment:   In Person  Provider:   You may see Dr. Johnsie Cancel, Heart Failure Clinic, or one of the following Advanced Practice Providers on your designated Care Team:    Truitt Merle, NP  Cecilie Kicks, NP  Kathyrn Drown, NP

## 2019-10-06 NOTE — Telephone Encounter (Signed)
Per Dr. Johnsie Cancel needs consult with TCTS due to abnormal echo/ large pericardial effusion and right heart cath with Dr. Aundra Dubin or Dr. Haroldine Laws. Will send message to heart failure clinic to get dates for heart cath. Will send message to [facility] to help with referral.

## 2019-10-06 NOTE — Anesthesia Preprocedure Evaluation (Deleted)
Anesthesia Evaluation    Airway        Dental   Pulmonary           Cardiovascular hypertension,      Neuro/Psych    GI/Hepatic   Endo/Other    Renal/GU      Musculoskeletal   Abdominal   Peds  Hematology   Anesthesia Other Findings   Reproductive/Obstetrics                             Anesthesia Physical Anesthesia Plan  ASA:   Anesthesia Plan:    Post-op Pain Management:    Induction:   PONV Risk Score and Plan:   Airway Management Planned:   Additional Equipment:   Intra-op Plan:   Post-operative Plan:   Informed Consent:   Plan Discussed with:   Anesthesia Plan Comments: (See recent PAT note by Karoline Caldwell, PA-C 09/21/19. Case with Dr. Harlow Asa was cancelled at that time due to pt needing cardiology clearance given recent diagnosis of DCM, HFrEF, Mod pericardial effusion, and moderate MR/TR. Pt was seen and cleared by Dr. Johnsie Cancel 10/06/19. Per note, "1. DCM:  ? Related to poorly controlled BP, vs COVID. Minimal troponin elevation and no acute ECG changes will need repeat echo try to get done today continue Lasix dose per renal and dialysis as she is not anuric. Continue hydralazine and add coreg 6.25 bid.  2. Moderate Pericardial Effusion:  See above no tamponade on TTE 08/23/19 ? Related to uremia/CRF vs COVID echo today if not larger and no tamponade can try to have surgery in am. 3. Valve Disease:  Will need to follow for moderate MR/TR no need for SBE. 4. CRF:  So long as pericardial effusion is not worse/ tamponade she should be able to proceed with fistula placement Given small stature and likely veins will likely need graft in upper arm F/U Dr [employer] and Early. 5. HPT:  F/u with Dr Harlow Asa ok to proceed with neck surgery after dialysis access."  Per Becky at [facility], Dr. Johnsie Cancel is going to call Dr. Donnetta Hutching with the results of the pt's echo. If the patient is not cleared for surgery, Dr.  Donnetta Hutching said he will notify the OR. )       Anesthesia Quick Evaluation

## 2019-10-06 NOTE — Progress Notes (Signed)
  Echocardiogram 2D Echocardiogram has been performed.  Dominique Lawrence 10/06/2019, 4:29 PM

## 2019-10-07 ENCOUNTER — Inpatient Hospital Stay (HOSPITAL_COMMUNITY): Payer: Medicare Other

## 2019-10-07 ENCOUNTER — Inpatient Hospital Stay (HOSPITAL_COMMUNITY)
Admission: EM | Admit: 2019-10-07 | Discharge: 2019-10-12 | DRG: 270 | Disposition: A | Discharge status: Home / Self Care | Payer: Medicare Other | Attending: Cardiothoracic Surgery | Admitting: Cardiothoracic Surgery

## 2019-10-07 ENCOUNTER — Other Ambulatory Visit: Payer: Self-pay | Admitting: Physician Assistant

## 2019-10-07 ENCOUNTER — Ambulatory Visit (HOSPITAL_COMMUNITY): Admission: RE | Admit: 2019-10-07 | Payer: Medicare Other | Source: Home / Self Care | Admitting: Internal Medicine

## 2019-10-07 ENCOUNTER — Other Ambulatory Visit: Payer: Self-pay

## 2019-10-07 ENCOUNTER — Encounter (HOSPITAL_COMMUNITY)
Admission: EM | Disposition: A | Discharge status: Home / Self Care | Payer: Self-pay | Source: Home / Self Care | Attending: Cardiothoracic Surgery

## 2019-10-07 ENCOUNTER — Ambulatory Visit: Admit: 2019-10-07 | Payer: Medicare Other | Admitting: Vascular Surgery

## 2019-10-07 ENCOUNTER — Other Ambulatory Visit (HOSPITAL_COMMUNITY): Payer: Medicare Other

## 2019-10-07 DIAGNOSIS — D696 Thrombocytopenia, unspecified: Secondary | ICD-10-CM | POA: Diagnosis present

## 2019-10-07 DIAGNOSIS — Q8781 Alport syndrome: Secondary | ICD-10-CM

## 2019-10-07 DIAGNOSIS — Z888 Allergy status to other drugs, medicaments and biological substances status: Secondary | ICD-10-CM

## 2019-10-07 DIAGNOSIS — Z79899 Other long term (current) drug therapy: Secondary | ICD-10-CM | POA: Diagnosis not present

## 2019-10-07 DIAGNOSIS — D631 Anemia in chronic kidney disease: Secondary | ICD-10-CM | POA: Diagnosis present

## 2019-10-07 DIAGNOSIS — I3139 Other pericardial effusion (noninflammatory): Secondary | ICD-10-CM | POA: Diagnosis present

## 2019-10-07 DIAGNOSIS — Z992 Dependence on renal dialysis: Secondary | ICD-10-CM

## 2019-10-07 DIAGNOSIS — N2581 Secondary hyperparathyroidism of renal origin: Secondary | ICD-10-CM | POA: Diagnosis present

## 2019-10-07 DIAGNOSIS — Z9115 Patient's noncompliance with renal dialysis: Secondary | ICD-10-CM

## 2019-10-07 DIAGNOSIS — Z20822 Contact with and (suspected) exposure to covid-19: Secondary | ICD-10-CM | POA: Diagnosis present

## 2019-10-07 DIAGNOSIS — I5022 Chronic systolic (congestive) heart failure: Secondary | ICD-10-CM | POA: Diagnosis present

## 2019-10-07 DIAGNOSIS — I132 Hypertensive heart and chronic kidney disease with heart failure and with stage 5 chronic kidney disease, or end stage renal disease: Secondary | ICD-10-CM | POA: Diagnosis present

## 2019-10-07 DIAGNOSIS — Z792 Long term (current) use of antibiotics: Secondary | ICD-10-CM

## 2019-10-07 DIAGNOSIS — I081 Rheumatic disorders of both mitral and tricuspid valves: Secondary | ICD-10-CM | POA: Diagnosis present

## 2019-10-07 DIAGNOSIS — F329 Major depressive disorder, single episode, unspecified: Secondary | ICD-10-CM | POA: Diagnosis present

## 2019-10-07 DIAGNOSIS — I5020 Unspecified systolic (congestive) heart failure: Secondary | ICD-10-CM | POA: Diagnosis not present

## 2019-10-07 DIAGNOSIS — Z905 Acquired absence of kidney: Secondary | ICD-10-CM

## 2019-10-07 DIAGNOSIS — N186 End stage renal disease: Secondary | ICD-10-CM | POA: Diagnosis present

## 2019-10-07 DIAGNOSIS — I313 Pericardial effusion (noninflammatory): Principal | ICD-10-CM | POA: Diagnosis present

## 2019-10-07 DIAGNOSIS — I428 Other cardiomyopathies: Secondary | ICD-10-CM | POA: Diagnosis present

## 2019-10-07 DIAGNOSIS — I314 Cardiac tamponade: Secondary | ICD-10-CM

## 2019-10-07 DIAGNOSIS — Z8616 Personal history of COVID-19: Secondary | ICD-10-CM | POA: Diagnosis not present

## 2019-10-07 DIAGNOSIS — Z9885 Transplanted organ removal status: Secondary | ICD-10-CM | POA: Diagnosis not present

## 2019-10-07 DIAGNOSIS — I272 Pulmonary hypertension, unspecified: Secondary | ICD-10-CM | POA: Diagnosis present

## 2019-10-07 HISTORY — DX: Personal history of other medical treatment: Z92.89

## 2019-10-07 HISTORY — DX: Essential (primary) hypertension: I10

## 2019-10-07 HISTORY — DX: Tachycardia, unspecified: R00.0

## 2019-10-07 HISTORY — PX: RIGHT/LEFT HEART CATH AND CORONARY ANGIOGRAPHY: CATH118266

## 2019-10-07 HISTORY — DX: End stage renal disease: N18.6

## 2019-10-07 LAB — COMPREHENSIVE METABOLIC PANEL
ALT: 11 U/L (ref 0–44)
ALT: 12 U/L (ref 0–44)
AST: 21 U/L (ref 15–41)
AST: 22 U/L (ref 15–41)
Albumin: 3.8 g/dL (ref 3.5–5.0)
Albumin: 4 g/dL (ref 3.5–5.0)
Alkaline Phosphatase: 103 U/L (ref 38–126)
Alkaline Phosphatase: 96 U/L (ref 38–126)
Anion gap: 15 (ref 5–15)
Anion gap: 18 — ABNORMAL HIGH (ref 5–15)
BUN: 10 mg/dL (ref 6–20)
BUN: 11 mg/dL (ref 6–20)
CO2: 27 mmol/L (ref 22–32)
CO2: 26 mmol/L (ref 22–32)
Calcium: 10.1 mg/dL (ref 8.9–10.3)
Calcium: 10.4 mg/dL — ABNORMAL HIGH (ref 8.9–10.3)
Chloride: 95 mmol/L — ABNORMAL LOW (ref 98–111)
Chloride: 96 mmol/L — ABNORMAL LOW (ref 98–111)
Creatinine, Ser: 4.48 mg/dL — ABNORMAL HIGH (ref 0.44–1.00)
Creatinine, Ser: 4.5 mg/dL — ABNORMAL HIGH (ref 0.44–1.00)
GFR calc Af Amer: 15 mL/min — ABNORMAL LOW (ref >60)
GFR calc Af Amer: 15 mL/min — ABNORMAL LOW (ref >60)
GFR calc non Af Amer: 13 mL/min — ABNORMAL LOW (ref >60)
GFR calc non Af Amer: 13 mL/min — ABNORMAL LOW (ref >60)
Glucose, Bld: 76 mg/dL (ref 70–99)
Glucose, Bld: 71 mg/dL (ref 70–99)
Potassium: 3.3 mmol/L — ABNORMAL LOW (ref 3.5–5.1)
Potassium: 3.4 mmol/L — ABNORMAL LOW (ref 3.5–5.1)
Sodium: 137 mmol/L (ref 135–145)
Sodium: 140 mmol/L (ref 135–145)
Total Bilirubin: 1.8 mg/dL — ABNORMAL HIGH (ref 0.3–1.2)
Total Bilirubin: 1.2 mg/dL (ref 0.3–1.2)
Total Protein: 7.7 g/dL (ref 6.5–8.1)
Total Protein: 7.8 g/dL (ref 6.5–8.1)

## 2019-10-07 LAB — POCT I-STAT EG7
Acid-Base Excess: 7 mmol/L — ABNORMAL HIGH (ref 0.0–2.0)
Acid-Base Excess: 8 mmol/L — ABNORMAL HIGH (ref 0.0–2.0)
Bicarbonate: 31.8 mmol/L — ABNORMAL HIGH (ref 20.0–28.0)
Bicarbonate: 32.7 mmol/L — ABNORMAL HIGH (ref 20.0–28.0)
Calcium, Ion: 1.22 mmol/L (ref 1.15–1.40)
Calcium, Ion: 1.24 mmol/L (ref 1.15–1.40)
HCT: 25 % — ABNORMAL LOW (ref 36.0–46.0)
HCT: 25 % — ABNORMAL LOW (ref 36.0–46.0)
Hemoglobin: 8.5 g/dL — ABNORMAL LOW (ref 12.0–15.0)
Hemoglobin: 8.5 g/dL — ABNORMAL LOW (ref 12.0–15.0)
O2 Saturation: 76 %
O2 Saturation: 76 %
Potassium: 3.4 mmol/L — ABNORMAL LOW (ref 3.5–5.1)
Potassium: 3.4 mmol/L — ABNORMAL LOW (ref 3.5–5.1)
Sodium: 139 mmol/L (ref 135–145)
Sodium: 139 mmol/L (ref 135–145)
TCO2: 33 mmol/L — ABNORMAL HIGH (ref 22–32)
TCO2: 34 mmol/L — ABNORMAL HIGH (ref 22–32)
pCO2, Ven: 46.4 mmHg (ref 44.0–60.0)
pCO2, Ven: 47.2 mmHg (ref 44.0–60.0)
pH, Ven: 7.436 — ABNORMAL HIGH (ref 7.250–7.430)
pH, Ven: 7.455 — ABNORMAL HIGH (ref 7.250–7.430)
pO2, Ven: 40 mmHg (ref 32.0–45.0)
pO2, Ven: 40 mmHg (ref 32.0–45.0)

## 2019-10-07 LAB — POCT I-STAT 7, (LYTES, BLD GAS, ICA,H+H)
Acid-Base Excess: 5 mmol/L — ABNORMAL HIGH (ref 0.0–2.0)
Bicarbonate: 29.1 mmol/L — ABNORMAL HIGH (ref 20.0–28.0)
Calcium, Ion: 1.08 mmol/L — ABNORMAL LOW (ref 1.15–1.40)
HCT: 23 % — ABNORMAL LOW (ref 36.0–46.0)
Hemoglobin: 7.8 g/dL — ABNORMAL LOW (ref 12.0–15.0)
O2 Saturation: 98 %
Potassium: 3.1 mmol/L — ABNORMAL LOW (ref 3.5–5.1)
Sodium: 142 mmol/L (ref 135–145)
TCO2: 30 mmol/L (ref 22–32)
pCO2 arterial: 42.3 mmHg (ref 32.0–48.0)
pH, Arterial: 7.447 (ref 7.350–7.450)
pO2, Arterial: 107 mmHg (ref 83.0–108.0)

## 2019-10-07 LAB — CBC WITH DIFFERENTIAL/PLATELET
Abs Immature Granulocytes: 0.01 10*3/uL (ref 0.00–0.07)
Basophils Absolute: 0 10*3/uL (ref 0.0–0.1)
Basophils Relative: 1 %
Eosinophils Absolute: 0.2 10*3/uL (ref 0.0–0.5)
Eosinophils Relative: 4 %
HCT: 32.2 % — ABNORMAL LOW (ref 36.0–46.0)
Hemoglobin: 10.1 g/dL — ABNORMAL LOW (ref 12.0–15.0)
Immature Granulocytes: 0 %
Lymphocytes Relative: 18 %
Lymphs Abs: 0.9 10*3/uL (ref 0.7–4.0)
MCH: 28.9 pg (ref 26.0–34.0)
MCHC: 31.4 g/dL (ref 30.0–36.0)
MCV: 92 fL (ref 80.0–100.0)
Monocytes Absolute: 0.3 10*3/uL (ref 0.1–1.0)
Monocytes Relative: 5 %
Neutro Abs: 3.5 10*3/uL (ref 1.7–7.7)
Neutrophils Relative %: 72 %
Platelets: 102 10*3/uL — ABNORMAL LOW (ref 150–400)
RBC: 3.5 MIL/uL — ABNORMAL LOW (ref 3.87–5.11)
RDW: 16.8 % — ABNORMAL HIGH (ref 11.5–15.5)
WBC: 4.8 10*3/uL (ref 4.0–10.5)
nRBC: 0 % (ref 0.0–0.2)

## 2019-10-07 LAB — APTT: aPTT: 33 seconds (ref 24–36)

## 2019-10-07 LAB — SURGICAL PCR SCREEN
MRSA, PCR: NEGATIVE
Staphylococcus aureus: POSITIVE — AB

## 2019-10-07 LAB — HCG, QUANTITATIVE, PREGNANCY: hCG, Beta Chain, Quant, S: 2 m[IU]/mL (ref <5)

## 2019-10-07 LAB — PREPARE RBC (CROSSMATCH)

## 2019-10-07 LAB — PROTIME-INR
INR: 1.1 (ref 0.8–1.2)
Prothrombin Time: 14 seconds (ref 11.4–15.2)

## 2019-10-07 LAB — TSH: TSH: 3.297 u[IU]/mL (ref 0.350–4.500)

## 2019-10-07 SURGERY — RIGHT/LEFT HEART CATH AND CORONARY ANGIOGRAPHY
Anesthesia: LOCAL

## 2019-10-07 SURGERY — ARTERIOVENOUS (AV) FISTULA CREATION
Anesthesia: Monitor Anesthesia Care | Laterality: Left

## 2019-10-07 MED ORDER — SODIUM CHLORIDE 0.9 % IV SOLN
INTRAVENOUS | Status: AC | PRN
  Administered 2019-10-07 (×5): 10 mL/h via INTRAVENOUS

## 2019-10-07 MED ORDER — SODIUM CHLORIDE 0.9% FLUSH: 3.0000 mL | INTRAVENOUS | Status: DC | PRN

## 2019-10-07 MED ORDER — MIDAZOLAM HCL 2 MG/2ML IJ SOLN
INTRAMUSCULAR | Status: AC
  Filled 2019-10-07: qty 2

## 2019-10-07 MED ORDER — METOPROLOL TARTRATE 5 MG/5ML IV SOLN
INTRAVENOUS | Status: DC | PRN
  Administered 2019-10-07: 2.5 mg via INTRAVENOUS

## 2019-10-07 MED ORDER — HEPARIN SODIUM (PORCINE) 1000 UNIT/ML IJ SOLN
INTRAMUSCULAR | Status: AC
  Filled 2019-10-07: qty 1

## 2019-10-07 MED ORDER — CARVEDILOL 6.25 MG PO TABS
6.2500 mg | ORAL_TABLET | Freq: Two times a day (BID) | ORAL | Status: DC
  Administered 2019-10-07 – 2019-10-08 (×2): 6.25 mg via ORAL
  Filled 2019-10-07 (×2): qty 1

## 2019-10-07 MED ORDER — FENTANYL CITRATE (PF) 100 MCG/2ML IJ SOLN
INTRAMUSCULAR | Status: DC | PRN
  Administered 2019-10-07: 50 ug via INTRAVENOUS

## 2019-10-07 MED ORDER — ENOXAPARIN SODIUM 40 MG/0.4ML ~~LOC~~ SOLN: 30.0000 mg | SUBCUTANEOUS | Status: DC

## 2019-10-07 MED ORDER — LIDOCAINE HCL (PF) 1 % IJ SOLN
INTRAMUSCULAR | Status: AC
  Filled 2019-10-07: qty 30

## 2019-10-07 MED ORDER — SODIUM CHLORIDE 0.9 % IV SOLN: INTRAVENOUS | Status: DC

## 2019-10-07 MED ORDER — ACETAMINOPHEN 500 MG PO TABS: 500.0000 mg | ORAL_TABLET | Freq: Four times a day (QID) | ORAL | Status: DC | PRN

## 2019-10-07 MED ORDER — IOHEXOL 350 MG/ML SOLN
INTRAVENOUS | Status: DC | PRN
  Administered 2019-10-07: 20 mL

## 2019-10-07 MED ORDER — METOPROLOL TARTRATE 5 MG/5ML IV SOLN
INTRAVENOUS | Status: AC
  Filled 2019-10-07: qty 5

## 2019-10-07 MED ORDER — ONDANSETRON HCL 4 MG/2ML IJ SOLN: 4.0000 mg | Freq: Four times a day (QID) | INTRAMUSCULAR | Status: DC | PRN

## 2019-10-07 MED ORDER — HEPARIN (PORCINE) IN NACL 1000-0.9 UT/500ML-% IV SOLN
INTRAVENOUS | Status: AC
  Filled 2019-10-07: qty 1000

## 2019-10-07 MED ORDER — CEFAZOLIN SODIUM-DEXTROSE 2-4 GM/100ML-% IV SOLN
2.0000 g | INTRAVENOUS | Status: AC
  Administered 2019-10-08: 2 g via INTRAVENOUS
  Filled 2019-10-07: qty 100

## 2019-10-07 MED ORDER — HYDRALAZINE HCL 20 MG/ML IJ SOLN
10.0000 mg | INTRAMUSCULAR | Status: AC | PRN
  Administered 2019-10-07: 17:00:00 10 mg via INTRAVENOUS
  Filled 2019-10-07: qty 1

## 2019-10-07 MED ORDER — MUPIROCIN 2 % EX OINT
1.0000 "application " | TOPICAL_OINTMENT | Freq: Two times a day (BID) | CUTANEOUS | Status: DC
  Administered 2019-10-07: 1 via NASAL
  Filled 2019-10-07: qty 22

## 2019-10-07 MED ORDER — PANTOPRAZOLE SODIUM 40 MG PO TBEC: 40.0000 mg | DELAYED_RELEASE_TABLET | Freq: Every day | ORAL | Status: DC

## 2019-10-07 MED ORDER — LIDOCAINE HCL (PF) 1 % IJ SOLN
INTRAMUSCULAR | Status: DC | PRN
  Administered 2019-10-07: 30 mL

## 2019-10-07 MED ORDER — ACETAMINOPHEN 325 MG PO TABS: 650.0000 mg | ORAL_TABLET | ORAL | Status: DC | PRN

## 2019-10-07 MED ORDER — SODIUM CHLORIDE 0.9% FLUSH
3.0000 mL | Freq: Two times a day (BID) | INTRAVENOUS | Status: DC
  Administered 2019-10-07: 22:00:00 3 mL via INTRAVENOUS

## 2019-10-07 MED ORDER — SODIUM CHLORIDE 0.9 % IV SOLN: 250.0000 mL | INTRAVENOUS | Status: DC | PRN

## 2019-10-07 MED ORDER — METOPROLOL TARTRATE 5 MG/5ML IV SOLN: 5.0000 mg | Freq: Once | INTRAVENOUS | Status: DC

## 2019-10-07 MED ORDER — AMLODIPINE BESYLATE 10 MG PO TABS: 10.0000 mg | ORAL_TABLET | Freq: Every day | ORAL | Status: DC

## 2019-10-07 MED ORDER — VERAPAMIL HCL 2.5 MG/ML IV SOLN
INTRAVENOUS | Status: AC
  Filled 2019-10-07: qty 2

## 2019-10-07 MED ORDER — SULFAMETHOXAZOLE-TRIMETHOPRIM 400-80 MG PO TABS
1.0000 | ORAL_TABLET | ORAL | Status: DC
  Administered 2019-10-11: 1 via ORAL
  Filled 2019-10-07 (×2): qty 1

## 2019-10-07 MED ORDER — LABETALOL HCL 5 MG/ML IV SOLN: 10.0000 mg | INTRAVENOUS | Status: AC | PRN

## 2019-10-07 MED ORDER — FENTANYL CITRATE (PF) 100 MCG/2ML IJ SOLN
INTRAMUSCULAR | Status: AC
  Filled 2019-10-07: qty 2

## 2019-10-07 MED ORDER — MIDAZOLAM HCL 2 MG/2ML IJ SOLN
INTRAMUSCULAR | Status: DC | PRN
  Administered 2019-10-07: 1 mg via INTRAVENOUS
  Administered 2019-10-07: 2 mg via INTRAVENOUS

## 2019-10-07 MED ORDER — HEPARIN (PORCINE) IN NACL 1000-0.9 UT/500ML-% IV SOLN
INTRAVENOUS | Status: DC | PRN
  Administered 2019-10-07 (×2): 500 mL

## 2019-10-07 SURGICAL SUPPLY — 11 items
CATH DXT MULTI JL4 JR4 ANG PIG (CATHETERS) ×1 IMPLANT
CATH SWAN GANZ VIP 7.5F (CATHETERS) ×1 IMPLANT
PACK CARDIAC CATHETERIZATION (CUSTOM PROCEDURE TRAY) ×2 IMPLANT
SHEATH PINNACLE 5F 10CM (SHEATH) ×1 IMPLANT
SHEATH PINNACLE 8F 10CM (SHEATH) ×1 IMPLANT
SLEEVE REPOSITIONING LENGTH 30 (MISCELLANEOUS) ×1 IMPLANT
TRANSDUCER W/STOPCOCK (MISCELLANEOUS) ×3 IMPLANT
TUBING ART PRESS 72  MALE/FEM (TUBING) ×1
TUBING ART PRESS 72 MALE/FEM (TUBING) IMPLANT
WIRE EMERALD 3MM-J .025X260CM (WIRE) ×1 IMPLANT
WIRE EMERALD 3MM-J .035X150CM (WIRE) ×1 IMPLANT

## 2019-10-07 NOTE — H&P (Signed)
Advanced Heart Failure Team History and Physical Note   PCP:  Ettefagh, Paul Dykes, MD  PCP-Cardiology: No primary care provider on file.     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 She is on HD MWF and followed by Dr. Justin Mend. 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 Now w/ subsequent secondary hyperparathyroidism, needing total parathyroidectomy.   Of note, she was diagnosed w/ Covid 19 infection 08/2019 and hospitalized, treated w/ decadron and remdesivir . Had 2D echo 08/23/19 showing moderate pericardial effusion but no tamponade physiology, moderate MR/TR and severely reduced LVEF 25-30%. Hs troponin level was mildly elevated but flat trend, 72>>79. ? Myocarditis. Unable to get cMRi due to renal function. HF was treated w/ lasix + HD for volume management + coreg and hydralazine.   She was seen in general cardiology clinic yesterday by Dr. Johnsie Cancel for preoperative clearance prior to planned parathyroidectomy. Denied dyspnea, palpations and CP. Echo was repeated and demonstrated increased size of pericardial effusion, w/ progression from moderate to severe. No evidence of tamponade. LVEF 30-35%. RV normal size w/ hyperdynamic systolic function.   Recommendations were to admit for RHC and possible CT surgical consult for pericardial window.   Of note, she had repeat covid test 10/05/19 that was negative. Denies fever and chills.   2D Echo 10/06/19  IMPRESSIONS    1. Left ventricular ejection fraction, by visual estimation, is 30 to 35%. The left ventricle has mildly decreased function. There is moderately increased left ventricular hypertrophy.  2. The left ventricle demonstrates global hypokinesis.  3. Global right ventricle has hyperdynamic systolic function.The right  ventricular size is small. No increase in right ventricular wall thickness.  4. Left atrial size was moderately dilated.  5. Right atrial size was normal.  6. Large pericardial effusion.  7. The pericardial effusion is circumferential.  8. Effusion meaures up to 3.5 at the LV posterior wall.  9. The mitral valve is abnormal. Mild to moderate mitral valve regurgitation. 10. The tricuspid valve is grossly normal. 11. The tricuspid valve is grossly normal. Tricuspid valve regurgitation is mild. 12. The aortic valve is tricuspid. Aortic valve regurgitation is not visualized. Mild aortic valve sclerosis without stenosis. 13. The pulmonic valve was grossly normal. Pulmonic valve regurgitation is not visualized. 14. The inferior vena cava is normal in size with <50% respiratory variability, suggesting right atrial pressure of 8 mmHg. 15. Hyperechoic floating structure in the RA, may be a dialysis catheter tip.   Review of Systems: [y] = yes, [ ]  = no   General: Weight gain [ ] ; Weight loss [ ] ; Anorexia [ ] ; Fatigue Blue.Reese ]; Fever [ ] ; Chills [ ] ; Weakness [ ]   Cardiac: Chest pain/pressure [ ] ; Resting SOB [ ] ; Exertional SOB [ y]; Orthopnea [ ] ; Pedal Edema [ ] ; Palpitations [ ] ; Syncope [ ] ; Presyncope [ ] ; Paroxysmal nocturnal dyspnea[ ]   Pulmonary: Cough [ ] ; Wheezing[ ] ; Hemoptysis[ ] ; Sputum [ ] ; Snoring [ ]   GI: Vomiting[ ] ; Dysphagia[ ] ; Melena[ ] ; Hematochezia [ ] ; Heartburn[ ] ; Abdominal pain [ ] ; Constipation [ ] ; Diarrhea [ ] ; BRBPR [ ]   GU: Hematuria[ ] ; Dysuria [ ] ; Nocturia[ ]   Vascular: Pain in legs with walking [ ] ; Pain in feet  with lying flat [ ] ; Non-healing sores [ ] ; Stroke [ ] ; TIA [ ] ; Slurred speech [ ] ;  Neuro: Headaches[ ] ; Vertigo[ ] ; Seizures[ ] ; Paresthesias[ ] ;Blurred vision [ ] ; Diplopia [ ] ; Vision changes [ ]   Ortho/Skin: Arthritis [ ] ; Joint pain [ ] ; Muscle pain [ ] ; Joint swelling [ ] ; Back Pain [ ] ; Rash [ ]   Psych: Depression[ ] ; Anxiety[y ]  Heme: Bleeding  problems [ ] ; Clotting disorders [ ] ; Anemia Blue.Reese ]  Endocrine: Diabetes [ ] ; Thyroid dysfunction[ ]    Home Medications Prior to Admission medications   Medication Sig Start Date End Date Taking? Authorizing Provider  acetaminophen (TYLENOL) 500 MG tablet Take 500 mg by mouth every 6 (six) hours as needed for moderate pain or headache.    [provider]  amLODipine (NORVASC) 10 MG tablet Take 10 mg by mouth daily.    [provider]  carvedilol (COREG) 6.25 MG tablet Take 1 tablet (6.25 mg total) by mouth 2 (two) times daily. 10/06/19   Josue Hector, MD  ENVARSUS XR 1 MG TB24 Take 1 mg by mouth daily. 09/10/19   [provider]  etonogestrel (NEXPLANON) 68 MG IMPL implant 68 mg by Subdermal route once. Jan 2018    [provider]  furosemide (LASIX) 40 MG tablet Take 40 mg by mouth 2 (two) times a week.  09/09/19 09/08/20  [provider]  omeprazole (PRILOSEC) 20 MG capsule Take 20 mg by mouth daily.  09/09/19   [provider]  sevelamer carbonate (RENVELA) 800 MG tablet Take 800 mg by mouth 3 (three) times daily with meals.    [provider]  sulfamethoxazole-trimethoprim (BACTRIM) 400-80 MG tablet Take 1 tablet by mouth every Monday, Wednesday, and Friday.  09/10/19   [provider]    Past Medical History: Past Medical History:  Diagnosis Date  . Alport syndrome   . Alport syndrome   . Anemia   . Constipation 12/18/2013  . Depression 07/28/2013  . ESRD (end stage renal disease) ([place])     Transplant failed Hemo MWF at [facility].  Marland Kitchen History of blood transfusion   . Hypertension   . Influenza B   . S/P wisdom tooth extraction 06/22/2016   Right mandibular dental pain  . Sinus tachycardia     Past Surgical History: Past Surgical History:  Procedure Laterality Date  . Hemodialysis catheter placed Right   . 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 NEPHRECTOMY Right     Family History:    Family History  Problem Relation Age of Onset  . Alport syndrome Brother     Social History: Social History   Socioeconomic History  . Marital status: Married    Spouse name: Not on file  . Number of children: Not on file  . Years of education: Not on file  . Highest education level: Not on file  Occupational History  . Not on file  Tobacco Use  . Smoking status: Never Smoker  . Smokeless tobacco: Never Used  Substance and Sexual Activity  . Alcohol use: No  . Drug use: No  . Sexual activity: Not Currently    Birth control/protection: Implant  Other Topics Concern  . Not on file  Social History Narrative   Lives with Mom, Sister, & 2 Brothers. No pets at home. No one smokes.   Social Determinants of Health   Financial Resource Strain:   . Difficulty of Paying Living Expenses: Not on  file  Food Insecurity:   . Worried About Charity fundraiser in the Last Year: Not on file  . Ran Out of Food in the Last Year: Not on file  Transportation Needs:   . Lack of Transportation (Medical): Not on file  . Lack of Transportation (Non-Medical): Not on file  Physical Activity:   . Days of Exercise per Week: Not on file  . Minutes of Exercise per Session: Not on file  Stress:   . Feeling of Stress : Not on file  Social Connections:   . Frequency of Communication with Friends and Family: Not on file  . Frequency of Social Gatherings with Friends and Family: Not on file  . Attends Religious Services: Not on file  . Active Member of Clubs or Organizations: Not on file  . Attends Archivist Meetings: Not on file  . Marital Status: Not on file    Allergies:  Allergies  Allergen Reactions  . Enalapril Other (See Comments)    AKI  . Chlorhexidine Gluconate Hives  . Ibuprofen Other (See Comments)    Pt not able to take ibuprofen due to Alport's syndrome    Objective:    Vital Signs:   Temp:  [97.8 F (36.6 C)] 97.8 F (36.6 C) (01/28 0859) Pulse Rate:   [120-122] 122 (01/28 0915) Resp:  [17-24] 17 (01/28 0915) BP: (171)/(130-131) 171/130 (01/28 0915) SpO2:  [100 %] 100 % (01/28 0915) Weight:  [58.1 kg] 58.1 kg (01/28 0914)   Filed Weights   10/07/19 0914  Weight: 58.1 kg     Physical Exam     General:  Well appearing. No respiratory difficulty HEENT: Normal Neck: Supple. RIJ tunneled cath  JVP 7 No kussmaul's Carotids 2+ bilat; no bruits. No lymphadenopathy or thyromegaly appreciated. Cor: PMI nondisplaced. Tachy regular. hyperdynamic No rubs, gallops or murmurs. Lungs: Clear Abdomen: Soft, nontender, nondistended. No hepatosplenomegaly. No bruits or masses. Good bowel sounds. Extremities: No cyanosis, clubbing, rash, 1+ edema Neuro: Alert & oriented x 3, cranial nerves grossly intact. moves all 4 extremities w/o difficulty. Affect pleasant.   Telemetry   Sinus tach 110-120 Personally reviewed   EKG   Pending  Labs     Basic Metabolic Panel: No results for input(s): NA, K, CL, CO2, GLUCOSE, BUN, CREATININE, CALCIUM, MG, PHOS in the last 168 hours.  Liver Function Tests: No results for input(s): AST, ALT, ALKPHOS, BILITOT, PROT, ALBUMIN in the last 168 hours. No results for input(s): LIPASE, AMYLASE in the last 168 hours. No results for input(s): AMMONIA in the last 168 hours.  CBC: No results for input(s): WBC, NEUTROABS, HGB, HCT, MCV, PLT in the last 168 hours.  Cardiac Enzymes: No results for input(s): CKTOTAL, CKMB, CKMBINDEX, TROPONINI in the last 168 hours.  BNP: BNP (last 3 results) Recent Labs    08/23/19 0441 08/24/19 0540  BNP >4,500.0* >4,500.0*    ProBNP (last 3 results) No results for input(s): PROBNP in the last 8760 hours.   CBG: No results for input(s): GLUCAP in the last 168 hours.  Coagulation Studies: No results for input(s): LABPROT, INR in the last 72 hours.  Imaging: ECHOCARDIOGRAM COMPLETE  Result Date: 10/06/2019   ECHOCARDIOGRAM REPORT   Patient Name:   Dominique Lawrence Date of Exam: 10/06/2019 Medical Rec #:  [id number]                     Height:       64.0 in  Accession #:    [id number]                    Weight:       128.4 lb Date of Birth:  2001-05-18                    BSA:          1.62 m Patient Age:    18 years                      BP:           154/108 mmHg Patient Gender: F                             HR:           114 bpm. Exam Location:  Inpatient Procedure: 2D Echo Indications:    Pre-operative clearance  History:        Patient has prior history of Echocardiogram examinations, most                 recent 08/23/2019. Risk Factors:Hypertension. CKD. ESRD.  Sonographer:    Clayton Lefort RDCS (AE) Referring Phys: [place]  1. Left ventricular ejection fraction, by visual estimation, is 30 to 35%. The left ventricle has mildly decreased function. There is moderately increased left ventricular hypertrophy.  2. The left ventricle demonstrates global hypokinesis.  3. Global right ventricle has hyperdynamic systolic function.The right ventricular size is small. No increase in right ventricular wall thickness.  4. Left atrial size was moderately dilated.  5. Right atrial size was normal.  6. Large pericardial effusion.  7. The pericardial effusion is circumferential.  8. Effusion meaures up to 3.5 at the LV posterior wall.  9. The mitral valve is abnormal. Mild to moderate mitral valve regurgitation. 10. The tricuspid valve is grossly normal. 11. The tricuspid valve is grossly normal. Tricuspid valve regurgitation is mild. 12. The aortic valve is tricuspid. Aortic valve regurgitation is not visualized. Mild aortic valve sclerosis without stenosis. 13. The pulmonic valve was grossly normal. Pulmonic valve regurgitation is not visualized. 14. The inferior vena cava is normal in size with <50% respiratory variability, suggesting right atrial pressure of 8 mmHg. 15. Hyperechoic floating structure in the RA, may be a dialysis catheter tip. In  comparison to the previous echocardiogram(s): The pericardial effusion is worse and has increased in size from 1.5 to 3.5 cm. Tamponade physiology cannot be excluded. The heart is noted to be freely swinging in the pericardium. FINDINGS  Left Ventricle: Left ventricular ejection fraction, by visual estimation, is 30 to 35%. The left ventricle has mildly decreased function. The left ventricle demonstrates global hypokinesis. There is moderately increased left ventricular hypertrophy. Right Ventricle: The right ventricular size is small. No increase in right ventricular wall thickness. Global RV systolic function is has hyperdynamic systolic function. Left Atrium: Left atrial size was moderately dilated. Right Atrium: Right atrial size was normal in size Hyperechoic floating structure in the RA, may be a dialysis catheter tip. Pericardium: A large pericardial effusion is present. The pericardial effusion is circumferential. There is no evidence of cardiac tamponade. Effusion meaures up to 3.5 at the LV posterior wall. Mitral Valve: The mitral valve is abnormal. There is mild thickening of the mitral valve leaflet(s). Mild to moderate mitral valve regurgitation. MV peak gradient, 12.7 mmHg. Tricuspid Valve: The tricuspid  valve is grossly normal. Tricuspid valve regurgitation is mild. Aortic Valve: The aortic valve is tricuspid. Aortic valve regurgitation is not visualized. Mild aortic valve sclerosis is present, with no evidence of aortic valve stenosis. Aortic valve mean gradient measures 6.0 mmHg. Aortic valve peak gradient measures 10.9 mmHg. Aortic valve area, by VTI measures 2.85 cm. Pulmonic Valve: The pulmonic valve was grossly normal. Pulmonic valve regurgitation is not visualized. Pulmonic regurgitation is not visualized. Aorta: The aortic root and ascending aorta are structurally normal, with no evidence of dilitation. Venous: The inferior vena cava is normal in size with less than 50% respiratory  variability, suggesting right atrial pressure of 8 mmHg. IAS/Shunts: No atrial level shunt detected by color flow Doppler.  LEFT VENTRICLE PLAX 2D LVIDd:         5.10 cm  Diastology LVIDs:         3.90 cm  LV e' lateral:   11.60 cm/s LV PW:         1.50 cm  LV E/e' lateral: 2.7 LV IVS:        1.10 cm  LV e' medial:    10.80 cm/s LVOT diam:     2.10 cm  LV E/e' medial:  2.9 LV SV:         58 ml LV SV Index:   35.63 LVOT Area:     3.46 cm  RIGHT VENTRICLE            IVC RV Basal diam:  2.80 cm    IVC diam: 1.80 cm RV S prime:     8.27 cm/s TAPSE (M-mode): 2.0 cm LEFT ATRIUM             Index       RIGHT ATRIUM           Index LA diam:        3.50 cm 2.16 cm/m  RA Area:     12.70 cm LA Vol (A2C):   53.6 ml 33.08 ml/m RA Volume:   26.70 ml  16.48 ml/m LA Vol (A4C):   75.5 ml 46.59 ml/m LA Biplane Vol: 67.4 ml 41.59 ml/m  AORTIC VALVE AV Area (Vmax):    2.96 cm AV Area (Vmean):   2.89 cm AV Area (VTI):     2.85 cm AV Vmax:           165.00 cm/s AV Vmean:          121.000 cm/s AV VTI:            0.255 m AV Peak Grad:      10.9 mmHg AV Mean Grad:      6.0 mmHg LVOT Vmax:         141.00 cm/s LVOT Vmean:        101.000 cm/s LVOT VTI:          0.210 m LVOT/AV VTI ratio: 0.82  AORTA Ao Root diam: 3.00 cm MITRAL VALVE MV Area (PHT): 7.59 cm              SHUNTS MV Peak grad:  12.7 mmHg             Systemic VTI:  0.21 m MV Mean grad:  4.0 mmHg              Systemic Diam: 2.10 cm MV Vmax:       1.78 m/s MV Vmean:      80.9 cm/s MV VTI:  0.36 m MV PHT:        29.00 msec MV Decel Time: 100 msec MR Peak grad:    119.7 mmHg MR Mean grad:    77.0 mmHg MR Vmax:         547.00 cm/s MR Vmean:        407.0 cm/s MR PISA:         1.01 cm MR PISA Eff ROA: 6 mm MR PISA Radius:  0.40 cm MV E velocity: 31.80 cm/s  103 cm/s MV A velocity: 172.00 cm/s 70.3 cm/s MV E/A ratio:  0.18        1.5  Lyman Bishop MD Electronically signed by Lyman Bishop MD Signature Date/Time: 10/06/2019/4:43:37 PM    Final        Assessment/Plan    1. Pericardial Effusion: large posterior effusion noted on echo 1/21, previously moderate on prior echo 12/20  - currently hemodynamically stable but effusion is sizeable and will likely need pericardial window - plan R/L cath today and pericardial window in am - Dr. Prescott Gum aware  2. Cardiomyopathy: new diagnosed made 08/2019 while admitted for covid 19 infection. EF 25-30%. Hs troponin level at time of initial diganosis was mildly elevated but flat trend, 72>>79. ? Viral Myocarditis vs HTN. Unable to get cMRi due to renal function.  - Repeat Echo 1/21 EF 30-35%, global hypokinesis, mod LVH, RV normal size and hyperdynamic systolic function - Plan RHC/LHC today to exclude ischemic etiology and to assess filling pressures and CO - continued volume management through HD - GDMT limited by CKD (no ARB/ARNi, MRA nor digoxin) - on  blocker therapy w/ coreg - consider Bidil for afterload reduction in place of ACE/ARB  3. 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 She is on HD MWF and followed by Dr. Justin Mend.  - management per nephrology   - Renal team notified   4. HTN - BP elevated.  Management as above  5. H/o COVID 19 Infection: - diagnosed 08/2019 - negative repeat covid test 10/05/19  6. Secondary hyperparathyroidism:  - 2/2 renal failure  - needs total parathyroidectomy but surgery postpone due to above cardiac issues    Glori Bickers, MD  11:12 AM   Advanced Heart Failure Team Pager [phone] (M-F; 7a - 4p)  Please contact [facility] for night-coverage after hours (4p -7a ) and weekends on [email or web address]

## 2019-10-07 NOTE — Anesthesia Preprocedure Evaluation (Addendum)
Anesthesia Evaluation  Patient identified by MRN, date of birth, ID band Patient awake    Reviewed: Allergy & Precautions, NPO status , Patient's Chart, lab work & pertinent test results  Airway Mallampati: I  TM Distance: >3 FB Neck ROM: Full    Dental no notable dental hx. (+) Dental Advisory Given, Teeth Intact   Pulmonary neg pulmonary ROS,    Pulmonary exam normal breath sounds clear to auscultation       Cardiovascular hypertension, Normal cardiovascular exam Rhythm:Regular Rate:Normal     Neuro/Psych PSYCHIATRIC DISORDERS Depression negative neurological ROS     GI/Hepatic negative GI ROS, Neg liver ROS,   Endo/Other  negative endocrine ROS  Renal/GU Renal disease     Musculoskeletal negative musculoskeletal ROS (+)   Abdominal   Peds  Hematology negative hematology ROS (+) anemia ,   Anesthesia Other Findings   Reproductive/Obstetrics negative OB ROS                            Anesthesia Physical Anesthesia Plan  ASA: III  Anesthesia Plan: General   Post-op Pain Management:    Induction: Intravenous  PONV Risk Score and Plan: 4 or greater and Ondansetron, Dexamethasone, Scopolamine patch - Pre-op, Treatment may vary due to age or medical condition and Midazolam  Airway Management Planned: Oral ETT  Additional Equipment: TEE  Intra-op Plan:   Post-operative Plan: Extubation in OR  Informed Consent: I have reviewed the patients History and Physical, chart, labs and discussed the procedure including the risks, benefits and alternatives for the proposed anesthesia with the patient or authorized representative who has indicated his/her understanding and acceptance.     Dental advisory given  Plan Discussed with: CRNA  Anesthesia Plan Comments:         Anesthesia Quick Evaluation

## 2019-10-07 NOTE — Interval H&P Note (Signed)
History and Physical Interval Note:  10/07/2019 12:50 PM  [place]  has presented today for surgery, with the diagnosis of pericardial effusion.  The various methods of treatment have been discussed with the patient and family. After consideration of risks, benefits and other options for treatment, the patient has consented to  Procedure(s): RIGHT/LEFT HEART CATH AND CORONARY ANGIOGRAPHY (N/A) as a surgical intervention.  The patient's history has been reviewed, patient examined, no change in status, stable for surgery.  I have reviewed the patient's chart and labs.  Questions were answered to the patient's satisfaction.     Mills Mitton

## 2019-10-07 NOTE — Consult Note (Signed)
[facility] Renal Consultation Note    Indication for Consultation:  Management of ESRD/hemodialysis; anemia, hypertension/volume and secondary hyperparathyroidism  IZT:[id number], Paul Dykes, MD  HPI: Dominique Lawrence is a 19 y.o. female. 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  Of note patient is non complaint with prescribed dialysis regimen, signing off at least 62min early almost every treatment.  They have been lowering her dry weight over the last few weeks.   Patient seen and examined at bedside.  No specific complaints.  Denies CP, SOB, edema, palpitations, n/v/d, abdominal pain, dizziness weakness and fatigue.  Reports orthopnea a few weeks ago which has improved since lowering her dry weight at dialysis.  States she was scheduled for permanent dialysis access placement today but required cardiac clearance prior.  Reports ECHO yesterday showed increased fluid around her heart.    Pertinent findings include ECHO completed 10/06/19 showing severe pericardial effusion with increase in size since previous ECHO completed in 08/2019, but no evidence of tamponade, and LVEF 30-35%; CXR with no signs pulmonary edema, cardiac cath showing normal coronary arteries, mild pulm HTN, EF 35% and large pericardial effusion w/o tamponade. Patient was admitted for R heart cath and possible pericardial window.    Past Medical History:  Diagnosis Date  . Alport syndrome   . Alport syndrome   . Anemia   . Constipation 12/18/2013  . Depression 07/28/2013  . ESRD (end stage renal disease) ([place])     Transplant failed Hemo MWF at [facility].  Marland Kitchen History of blood transfusion   . Hypertension   . Influenza B   . S/P  wisdom tooth extraction 06/22/2016   Right mandibular dental pain  . Sinus tachycardia    Past Surgical History:  Procedure Laterality Date  . Hemodialysis catheter placed Right   . 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 NEPHRECTOMY Right    Family History  Problem Relation Age of Onset  . Alport syndrome Brother    Social History:  reports that she has never smoked. She has never used smokeless tobacco. She reports that she does not drink alcohol or use drugs. Allergies  Allergen Reactions  . Enalapril Other (See Comments)    AKI  . Chlorhexidine Gluconate Hives  . Ibuprofen Other (See Comments)    Pt not able to take ibuprofen due to Alport's syndrome   Prior to Admission medications   Medication Sig Start Date End Date Taking? Authorizing Provider  acetaminophen (TYLENOL) 500 MG tablet Take 500 mg by mouth every 6 (six) hours as needed for moderate pain or headache.    [provider]  amLODipine (NORVASC) 10 MG tablet Take 10 mg by mouth daily.    [provider]  carvedilol (COREG) 6.25 MG tablet Take 1 tablet (6.25 mg total) by mouth 2 (two) times daily. 10/06/19   Josue Hector, MD  ENVARSUS XR 1 MG TB24 Take 1 mg by mouth daily. 09/10/19   [provider]  etonogestrel (NEXPLANON) 68 MG IMPL implant 68 mg by Subdermal route once. Jan 2018    [provider]  furosemide (LASIX) 40 MG tablet Take 40 mg by mouth 2 (two) times a week.  09/09/19 09/08/20  [provider]  omeprazole (PRILOSEC) 20 MG  capsule Take 20 mg by mouth daily.  09/09/19   [provider]  sevelamer carbonate (RENVELA) 800 MG tablet Take 800 mg by mouth 3 (three) times daily with meals.    [provider]  sulfamethoxazole-trimethoprim (BACTRIM) 400-80 MG tablet Take 1 tablet by mouth every Monday, Wednesday, and Friday.  09/10/19   [provider]   Current Facility-Administered Medications  Medication Dose Route Frequency Provider Last Rate  Last Admin  . 0.9 %  sodium chloride infusion  250 mL Intravenous PRN Bensimhon, Shaune Pascal, MD      . acetaminophen (TYLENOL) tablet 650 mg  650 mg Oral Q4H PRN Bensimhon, Shaune Pascal, MD      . amLODipine (NORVASC) tablet 10 mg  10 mg Oral Daily Bensimhon, Shaune Pascal, MD      . carvedilol (COREG) tablet 6.25 mg  6.25 mg Oral BID Bensimhon, Shaune Pascal, MD      . Derrill Memo ON 10/08/2019] enoxaparin (LOVENOX) injection 30 mg  30 mg Subcutaneous Q24H Bensimhon, Shaune Pascal, MD      . hydrALAZINE (APRESOLINE) injection 10 mg  10 mg Intravenous Q20 Min PRN Bensimhon, Shaune Pascal, MD      . labetalol (NORMODYNE) injection 10 mg  10 mg Intravenous Q10 min PRN Bensimhon, Shaune Pascal, MD      . ondansetron (ZOFRAN) injection 4 mg  4 mg Intravenous Q6H PRN Bensimhon, Shaune Pascal, MD      . pantoprazole (PROTONIX) EC tablet 40 mg  40 mg Oral Daily Bensimhon, Daniel R, MD      . sodium chloride flush (NS) 0.9 % injection 3 mL  3 mL Intravenous Q12H Bensimhon, Shaune Pascal, MD      . sodium chloride flush (NS) 0.9 % injection 3 mL  3 mL Intravenous PRN Bensimhon, Shaune Pascal, MD      . Derrill Memo ON 10/08/2019] sulfamethoxazole-trimethoprim (BACTRIM) 400-80 MG per tablet 1 tablet  1 tablet Oral Q M,W,F Bensimhon, Shaune Pascal, MD       Labs: Basic Metabolic Panel: Recent Labs  Lab 10/07/19 1040  NA 137  K 3.3*  CL 95*  CO2 27  GLUCOSE 76  BUN 10  CREATININE 4.48*  CALCIUM 10.1   Liver Function Tests: Recent Labs  Lab 10/07/19 1040  AST 21  ALT 11  ALKPHOS 103  BILITOT 1.8*  PROT 7.7  ALBUMIN 3.8   No results for input(s): LIPASE, AMYLASE in the last 168 hours. No results for input(s): AMMONIA in the last 168 hours. CBC: Recent Labs  Lab 10/07/19 1100  WBC 4.8  NEUTROABS 3.5  HGB 10.1*  HCT 32.2*  MCV 92.0  PLT 102*   Cardiac Enzymes: No results for input(s): CKTOTAL, CKMB, CKMBINDEX, TROPONINI in the last 168 hours. CBG: No results for input(s): GLUCAP in the last 168 hours. Iron Studies: No results for input(s):  IRON, TIBC, TRANSFERRIN, FERRITIN in the last 72 hours. Studies/Results: CARDIAC CATHETERIZATION  Result Date: 10/07/2019 Findings: Ao = 147/113 (130) LV = 152/16 RA = 5 RV = 54/12 PA = 51/21 (35) PCW = 21 Fick cardiac output/index = 7.1/4.4 Thermo CO/CI = 7.9/4.9 PVR = 1.9 WU FA sat = 98% PA sat = 76%, 76% Assessment: 1. Normal coronary arteries 2. Mild pulmonary HTN due to elevated left-sided pressures and high output 3. EF 35% by echo 4. Large pericardial effusion with no evidence of tamponade physiology Plan/Discussion: For pericardial window tomorrow. Glori Bickers, MD 2:53 PM   DG [facility]  Result Date: 10/07/2019 CLINICAL DATA:  Fluid around the heart on echo 10/06/2019. EXAM: PORTABLE CHEST 1 VIEW COMPARISON:  Radiograph 08/23/2019 FINDINGS: Large-bore central venous line unchanged. The cardiac silhouette is enlarged compared to prior. Small bilateral pleural effusions. No pulmonary edema. IMPRESSION: 1. Enlargement of the cardiac silhouette could indicate pericardial fluid as referenced in the clinical data. 2. Potential small effusions. 3. No pulmonary edema. Electronically Signed   By: Suzy Bouchard M.D.   On: 10/07/2019 10:54   ECHOCARDIOGRAM COMPLETE  Result Date: 10/06/2019   ECHOCARDIOGRAM REPORT   Patient Name:   Dominique Lawrence Date of Exam: 10/06/2019 Medical Rec #:  [id number]                     Height:       64.0 in Accession #:    [id number]                    Weight:       128.4 lb Date of Birth:  02/12/2001                    BSA:          1.62 m Patient Age:    18 years                      BP:           154/108 mmHg Patient Gender: F                             HR:           114 bpm. Exam Location:  Inpatient Procedure: 2D Echo Indications:    Pre-operative clearance  History:        Patient has prior history of Echocardiogram examinations, most                 recent 08/23/2019. Risk Factors:Hypertension. CKD. ESRD.  Sonographer:    Clayton Lefort RDCS  (AE) Referring Phys: [place]  1. Left ventricular ejection fraction, by visual estimation, is 30 to 35%. The left ventricle has mildly decreased function. There is moderately increased left ventricular hypertrophy.  2. The left ventricle demonstrates global hypokinesis.  3. Global right ventricle has hyperdynamic systolic function.The right ventricular size is small. No increase in right ventricular wall thickness.  4. Left atrial size was moderately dilated.  5. Right atrial size was normal.  6. Large pericardial effusion.  7. The pericardial effusion is circumferential.  8. Effusion meaures up to 3.5 at the LV posterior wall.  9. The mitral valve is abnormal. Mild to moderate mitral valve regurgitation. 10. The tricuspid valve is grossly normal. 11. The tricuspid valve is grossly normal. Tricuspid valve regurgitation is mild. 12. The aortic valve is tricuspid. Aortic valve regurgitation is not visualized. Mild aortic valve sclerosis without stenosis. 13. The pulmonic valve was grossly normal. Pulmonic valve regurgitation is not visualized. 14. The inferior vena cava is normal in size with <50% respiratory variability, suggesting right atrial pressure of 8 mmHg. 15. Hyperechoic floating structure in the RA, may be a dialysis catheter tip. In comparison to the previous echocardiogram(s): The pericardial effusion is worse and has increased in size from 1.5 to 3.5 cm. Tamponade physiology cannot be excluded. The heart is noted to be freely swinging in the pericardium. FINDINGS  Left Ventricle: Left ventricular  ejection fraction, by visual estimation, is 30 to 35%. The left ventricle has mildly decreased function. The left ventricle demonstrates global hypokinesis. There is moderately increased left ventricular hypertrophy. Right Ventricle: The right ventricular size is small. No increase in right ventricular wall thickness. Global RV systolic function is has hyperdynamic systolic function. Left  Atrium: Left atrial size was moderately dilated. Right Atrium: Right atrial size was normal in size Hyperechoic floating structure in the RA, may be a dialysis catheter tip. Pericardium: A large pericardial effusion is present. The pericardial effusion is circumferential. There is no evidence of cardiac tamponade. Effusion meaures up to 3.5 at the LV posterior wall. Mitral Valve: The mitral valve is abnormal. There is mild thickening of the mitral valve leaflet(s). Mild to moderate mitral valve regurgitation. MV peak gradient, 12.7 mmHg. Tricuspid Valve: The tricuspid valve is grossly normal. Tricuspid valve regurgitation is mild. Aortic Valve: The aortic valve is tricuspid. Aortic valve regurgitation is not visualized. Mild aortic valve sclerosis is present, with no evidence of aortic valve stenosis. Aortic valve mean gradient measures 6.0 mmHg. Aortic valve peak gradient measures 10.9 mmHg. Aortic valve area, by VTI measures 2.85 cm. Pulmonic Valve: The pulmonic valve was grossly normal. Pulmonic valve regurgitation is not visualized. Pulmonic regurgitation is not visualized. Aorta: The aortic root and ascending aorta are structurally normal, with no evidence of dilitation. Venous: The inferior vena cava is normal in size with less than 50% respiratory variability, suggesting right atrial pressure of 8 mmHg. IAS/Shunts: No atrial level shunt detected by color flow Doppler.  LEFT VENTRICLE PLAX 2D LVIDd:         5.10 cm  Diastology LVIDs:         3.90 cm  LV e' lateral:   11.60 cm/s LV PW:         1.50 cm  LV E/e' lateral: 2.7 LV IVS:        1.10 cm  LV e' medial:    10.80 cm/s LVOT diam:     2.10 cm  LV E/e' medial:  2.9 LV SV:         58 ml LV SV Index:   35.63 LVOT Area:     3.46 cm  RIGHT VENTRICLE            IVC RV Basal diam:  2.80 cm    IVC diam: 1.80 cm RV S prime:     8.27 cm/s TAPSE (M-mode): 2.0 cm LEFT ATRIUM             Index       RIGHT ATRIUM           Index LA diam:        3.50 cm 2.16 cm/m  RA  Area:     12.70 cm LA Vol (A2C):   53.6 ml 33.08 ml/m RA Volume:   26.70 ml  16.48 ml/m LA Vol (A4C):   75.5 ml 46.59 ml/m LA Biplane Vol: 67.4 ml 41.59 ml/m  AORTIC VALVE AV Area (Vmax):    2.96 cm AV Area (Vmean):   2.89 cm AV Area (VTI):     2.85 cm AV Vmax:           165.00 cm/s AV Vmean:          121.000 cm/s AV VTI:            0.255 m AV Peak Grad:      10.9 mmHg AV Mean Grad:      6.0  mmHg LVOT Vmax:         141.00 cm/s LVOT Vmean:        101.000 cm/s LVOT VTI:          0.210 m LVOT/AV VTI ratio: 0.82  AORTA Ao Root diam: 3.00 cm MITRAL VALVE MV Area (PHT): 7.59 cm              SHUNTS MV Peak grad:  12.7 mmHg             Systemic VTI:  0.21 m MV Mean grad:  4.0 mmHg              Systemic Diam: 2.10 cm MV Vmax:       1.78 m/s MV Vmean:      80.9 cm/s MV VTI:        0.36 m MV PHT:        29.00 msec MV Decel Time: 100 msec MR Peak grad:    119.7 mmHg MR Mean grad:    77.0 mmHg MR Vmax:         547.00 cm/s MR Vmean:        407.0 cm/s MR PISA:         1.01 cm MR PISA Eff ROA: 6 mm MR PISA Radius:  0.40 cm MV E velocity: 31.80 cm/s  103 cm/s MV A velocity: 172.00 cm/s 70.3 cm/s MV E/A ratio:  0.18        1.5  Lyman Bishop MD Electronically signed by Lyman Bishop MD Signature Date/Time: 10/06/2019/4:43:37 PM    Final     ROS: All others negative except those listed in HPI.   Physical Exam: Vitals:   10/07/19 1441 10/07/19 1446 10/07/19 1451 10/07/19 1456  BP: (!) 151/111 (!) 153/115 (!) 156/115 (!) 157/118  Pulse: 98 99 100 100  Resp: 18 (!) 33 (!) 32 (!) 62  Temp:      TempSrc:      SpO2: 99% 99% 98% 98%  Weight:      Height:         General: WDWN NAD, well appearing female Head: NCAT sclera not icteric MMM Neck: Supple. No lymphadenopathy Lungs: CTA bilaterally. No wheeze, rales or rhonchi. Breathing is unlabored. Heart: RRR. No murmur, rubs or gallops.  Abdomen: soft, nontender, +BS, no guarding, no rebound tenderness Lower extremities:no edema, ischemic changes, or open wounds   Neuro: AAOx3. Moves all extremities spontaneously. Psych:  Responds to questions appropriately with a normal affect. Dialysis Access: [facility]  Dialysis Orders:  MWF - [place]  3.75hrs, BFR 350, DFR AF1.5,  EDW 56.5kg, 2K/ 2Ca  Access: TDC  Heparin none Mircera 225 mcg q2wks - last 09/24/19 Parsabiv 5mg  qHD Calcitriol 2 mcg PO qHD   Assessment/Plan: 1.  Pericardial Effusion - large posterior effusion noted on ECHO 1/27 and again on Heart Cath 1/28.  Plans for pericardial window tomorrow per CTS. Per cards/CTS 2. Cardiomyopathy - ECHO EF 30-35%, global hypokinesis, mod LVH, RH normal size and hyperdynamic systolic fxn, HC - EF 07%, mild pulm HTN. Per cardiology. 3.  ESRD -  On HD MWF.  Orders written for HD tomorrow working around surgery scheduled. K 3.3, use 4K bath. 4.  Hypertension/volume  - Blood pressure elevated, on 10mg  norvasc, 6.25mg  coreg, hydralazine and labetalol prn.  Does not appear volume overloaded.  Has been losing weight, may need to be lowered further.  Get standing weights with HD.  5.  Anemia of CKD - Hgb 8.5, on max  ESA, due tomorrow, Aranesp 268mcg qwk(fri), follow trends, transfuse as needed  6.  Secondary Hyperparathyroidism -  Ca at goal. Will check phos. Continue VDRA and binders.  Needs parathyroidectomy but surgery postponed for cardiac clearance.   7.  Nutrition - Renal diet w/fluid restrictions once advanced.  8. Hx COVID - neg test  Jen Mow, PA-C [facility] Pager: [phone] 10/07/2019, 4:01 PM

## 2019-10-07 NOTE — Plan of Care (Signed)
  Problem: Clinical Measurements: Goal: Ability to maintain clinical measurements within normal limits will improve Outcome: Progressing Goal: Respiratory complications will improve Outcome: Progressing   Problem: Nutrition: Goal: Adequate nutrition will be maintained Outcome: Adequate for Discharge   Problem: Coping: Goal: Level of anxiety will decrease Outcome: Progressing   Problem: Pain Managment: Goal: General experience of comfort will improve Outcome: Progressing   Problem: Safety: Goal: Ability to remain free from injury will improve Outcome: Adequate for Discharge   Problem: Cardiovascular: Goal: Vascular access site(s) Level 0-1 will be maintained Outcome: Progressing

## 2019-10-07 NOTE — ED Provider Notes (Addendum)
MSE was initiated and I personally evaluated the patient and placed orders (if any) at  9:03 AM on October 07, 2019.  Pt presenting per Cardiology with scheduled cardiac catheterization today. Pt had outpt echo yesterday showing inc size of pericardial effusion. Pt with hx of alports syndrome. Recent COVID-19 infection last month, neg test yesterday. Scheduled for cath today by Dr. Haroldine Laws. Pt is not complaining of CP or shortness of breath.  Today's Vitals   10/07/19 0859  BP: (!) 171/131  Pulse: (!) 120  Resp: (!) 24  Temp: 97.8 F (36.6 C)  TempSrc: Oral  SpO2: 100%   There is no height or weight on file to calculate BMI.  Pt is tachycardic 120s, normal work of breathing. Hypertensive. Slight pericardial friction rub heard on auscultation. Lungs are clear. No distress noted.  Labs ordered by cardiology, cardiology to manage patient and transfer to cath lab.     Faylinn Schwenn, [place] N, PA-C 10/07/19 [place], [place] N, PA-C 10/07/19 [id number]    Davonna Belling, MD 10/07/19 1014

## 2019-10-07 NOTE — ED Triage Notes (Signed)
Pt arrives by POV . Pt had ECHO done 10/06/19 and was told she needed to comed in d/t fluid around her heart.

## 2019-10-07 NOTE — Consult Note (Signed)
[address]       [place] [address]             [phone]        [facility] Record #[id number] Date of Birth: 2001-02-10  Referring: No ref. provider found Primary Care: Ettefagh, Paul Dykes, MD Primary Cardiologist:No primary care provider on file.  Chief Complaint:    Chief Complaint  Patient presents with  . cath lab   Patient examined, images of echocardiogram, coronary arteriogram and right heart cath data personally reviewed and discussed with Dr. Haroldine Laws for coordination of care.   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  She also has a nonischemic cardiomyopathy.  She underwent cardiology evaluation before parathyroidectomy was found to have a large minimally symptomatic pericardial effusion.  Cardiology recommended that the effusion be drained before general anesthesia and parathyroidectomy.  Patient is currently resting comfortably after her left and right heart catheterization.  She is mildly tachycardic with stable blood pressure.  PA pressures are moderately elevated and cardiac output is preserved.  She underwent hemodialysis yesterday.  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  No previous known cardiac problems.  She has mild-moderate MR and TR on her echo but no vegetations.  Her current hemodialysis access is a tunneled dietetic catheter in the right neck.   Current Activity/ Functional Status: Patient lives with the family and is actively involved.   Zubrod Score: At the time of surgery this patient's most appropriate activity status/level should be described as: []     0    Normal activity, no symptoms []     1     Restricted in physical strenuous activity but ambulatory, able to do out light work [x]     2    Ambulatory and capable of self care, unable to do work activities, up and about                 more than 50%  Of the time                            []     3    Only limited self care, in bed greater than 50% of waking hours []     4    Completely disabled, no self care, confined to bed or chair []     5    Moribund  Past Medical History:  Diagnosis Date  . Alport syndrome   . Alport syndrome   . Anemia   . Constipation 12/18/2013  . Depression 07/28/2013  . ESRD (end stage renal disease) ([place])     Transplant failed Hemo MWF at [facility].  Marland Kitchen History of blood transfusion   . Hypertension   . Influenza B   . S/P wisdom tooth extraction 06/22/2016   Right mandibular dental pain  . Sinus tachycardia     Past Surgical History:  Procedure Laterality Date  . Hemodialysis catheter placed Right   . 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 NEPHRECTOMY Right     Social History   Tobacco Use  Smoking Status Never Smoker  Smokeless Tobacco Never Used    Social  History   Substance and Sexual Activity  Alcohol Use No     Allergies  Allergen Reactions  . Enalapril Other (See Comments)    AKI  . Chlorhexidine Gluconate Hives  . Ibuprofen Other (See Comments)    Pt not able to take ibuprofen due to Alport's syndrome    Current Facility-Administered Medications  Medication Dose Route Frequency Provider Last Rate Last Admin  . 0.9 %  sodium chloride infusion  250 mL Intravenous PRN Bensimhon, Shaune Pascal, MD      . acetaminophen (TYLENOL) tablet 650 mg  650 mg Oral Q4H PRN Bensimhon, Shaune Pascal, MD      . Derrill Memo ON 10/08/2019] amLODipine (NORVASC) tablet 10 mg  10 mg Oral Daily Bensimhon, Shaune Pascal, MD      . carvedilol (COREG) tablet 6.25 mg  6.25 mg Oral BID Bensimhon, Shaune Pascal, MD      . ceFAZolin (ANCEF) IVPB 2g/100 mL premix  2 g Intravenous 30 min Pre-Op [place] Gum,  Collier Salina, MD      . Derrill Memo ON 10/08/2019] enoxaparin (LOVENOX) injection 30 mg  30 mg Subcutaneous Q24H Bensimhon, Shaune Pascal, MD      . hydrALAZINE (APRESOLINE) injection 10 mg  10 mg Intravenous Q20 Min PRN Bensimhon, Shaune Pascal, MD   10 mg at 10/07/19 1722  . labetalol (NORMODYNE) injection 10 mg  10 mg Intravenous Q10 min PRN Bensimhon, Shaune Pascal, MD      . metoprolol tartrate (LOPRESSOR) injection 5 mg  5 mg Intravenous Once Bensimhon, Shaune Pascal, MD      . ondansetron [facility]) injection 4 mg  4 mg Intravenous Q6H PRN Bensimhon, Shaune Pascal, MD      . Derrill Memo ON 10/08/2019] pantoprazole (PROTONIX) EC tablet 40 mg  40 mg Oral Daily Bensimhon, Shaune Pascal, MD      . sodium chloride flush (NS) 0.9 % injection 3 mL  3 mL Intravenous Q12H Bensimhon, Shaune Pascal, MD      . sodium chloride flush (NS) 0.9 % injection 3 mL  3 mL Intravenous PRN Bensimhon, Shaune Pascal, MD      . Derrill Memo ON 10/08/2019] sulfamethoxazole-trimethoprim (BACTRIM) 400-80 MG per tablet 1 tablet  1 tablet Oral Q M,W,F Bensimhon, Shaune Pascal, MD        Facility-Administered Medications Prior to Admission  Medication Dose Route Frequency Provider Last Rate Last Admin  . sodium chloride flush (NS) 0.9 % injection 3 mL  3 mL Intravenous Q12H Josue Hector, MD       Medications Prior to Admission  Medication Sig Dispense Refill Last Dose  . acetaminophen (TYLENOL) 500 MG tablet Take 500 mg by mouth every 6 (six) hours as needed for moderate pain or headache.   unknown  . amLODipine (NORVASC) 10 MG tablet Take 10 mg by mouth daily.   10/07/2019 at Unknown time  . carvedilol (COREG) 6.25 MG tablet Take 1 tablet (6.25 mg total) by mouth 2 (two) times daily. 180 tablet 3 10/07/2019 at 0700  . ENVARSUS XR 1 MG TB24 Take 1 mg by mouth daily.   10/07/2019 at Unknown time  . etonogestrel (NEXPLANON) 68 MG IMPL implant 68 mg by Subdermal route once. Jan 2018     . furosemide (LASIX) 40 MG tablet Take 40 mg by mouth 2 (two) times a week. Tuesday and Thursday    10/07/2019 at Unknown time  . omeprazole (PRILOSEC) 20 MG capsule Take 20 mg by mouth daily.    10/07/2019 at Unknown time  .  sevelamer carbonate (RENVELA) 800 MG tablet Take 800 mg by mouth 3 (three) times daily with meals.   10/07/2019 at Unknown time  . sulfamethoxazole-trimethoprim (BACTRIM) 400-80 MG tablet Take 1 tablet by mouth every Monday, Wednesday, and Friday.    10/06/2019 at Unknown time    Family History  Problem Relation Age of Onset  . Alport syndrome Brother      Review of Systems:   ROS      Cardiac Review of Systems: Y or  [    ]= no  Chest Pain [    ]  Resting SOB [   ] Exertional SOB  [  ]  Orthopnea [  ]   Pedal Edema [   ]    Palpitations [ y ] Syncope  [  ]   Presyncope [   ]  General Review of Systems: [Y] = yes [  ]=no Constitional: recent weight change [  ]; anorexia [  ]; fatigue [  ]; nausea [  ]; night sweats [  ]; fever [  ]; or chills [  ]                                                               Dental: Last Dentist visit:   Eye : blurred vision [  ]; diplopia [   ]; vision changes [  ];  Amaurosis fugax[  ]; Resp: cough [  ];  wheezing[  ];  hemoptysis[  ]; shortness of breath[  ]; paroxysmal nocturnal dyspnea[  ]; dyspnea on exertion[ y ]; or orthopnea[  ];  GI:  gallstones[  ], vomiting[  ];  dysphagia[  ]; melena[  ];  hematochezia [  ]; heartburn[  ];   Hx of  Colonoscopy[  ]; GU: kidney stones [  ]; hematuria[  ];   dysuria [  ];  nocturia[  ];  history of     obstruction [  ]; urinary frequency [  ]             Skin: rash, swelling[  ];, hair loss[y  ];  peripheral edema[  ];  or itching[  ]; Musculosketetal: myalgias[  ];  joint swelling[  ];  joint erythema[  ];  joint pain[  ];  back pain[  ];  Heme/Lymph: bruising[  ];  bleeding[  ];  anemia[  ];  Neuro: TIA[  ];  headaches[  ];  stroke[  ];  vertigo[  ];  seizures[  ];   paresthesias[  ];  difficulty walking[  ];  Psych:depression[  ]; anxiety[  ];  Endocrine: diabetes[  ];  thyroid  dysfunction[  ];                   Physical Exam: BP (!) 144/107   Pulse 100   Temp 99 F (37.2 C)   Resp 19   Ht 5\' 4"  (1.626 m)   Wt 58.1 kg   SpO2 94%   BMI 21.97 kg/m         Exam    General- alert and comfortable     Neck- no JVD, no cervical adenopathy palpable, no carotid bruit   Lungs- clear without rales, wheezes   Cor- regular rate and rhythm, no murmur ,  gallop   Abdomen- soft, non-tender   Extremities - warm, non-tender, minimal edema   Neuro- oriented, appropriate, no focal weakness   Diagnostic Studies & Laboratory data:     Recent Radiology Findings:   CARDIAC CATHETERIZATION  Result Date: 10/07/2019 Findings: Ao = 147/113 (130) LV = 152/16 RA = 5 RV = 54/12 PA = 51/21 (35) PCW = 21 Fick cardiac output/index = 7.1/4.4 Thermo CO/CI = 7.9/4.9 PVR = 1.9 WU FA sat = 98% PA sat = 76%, 76% Assessment: 1. Normal coronary arteries 2. Mild pulmonary HTN due to elevated left-sided pressures and high output 3. EF 35% by echo 4. Large pericardial effusion with no evidence of tamponade physiology Plan/Discussion: For pericardial window tomorrow. Glori Bickers, MD 2:53 PM   DG Chest Port 1 View  Result Date: 10/07/2019 CLINICAL DATA:  Fluid around the heart on echo 10/06/2019. EXAM: PORTABLE CHEST 1 VIEW COMPARISON:  Radiograph 08/23/2019 FINDINGS: Large-bore central venous line unchanged. The cardiac silhouette is enlarged compared to prior. Small bilateral pleural effusions. No pulmonary edema. IMPRESSION: 1. Enlargement of the cardiac silhouette could indicate pericardial fluid as referenced in the clinical data. 2. Potential small effusions. 3. No pulmonary edema. Electronically Signed   By: Suzy Bouchard M.D.   On: 10/07/2019 10:54   ECHOCARDIOGRAM COMPLETE  Result Date: 10/06/2019   ECHOCARDIOGRAM REPORT   Patient Name:   Dominique [place] Date of Exam: 10/06/2019 Medical Rec #:  [id number]                     Height:       64.0 in Accession #:     [id number]                    Weight:       128.4 lb Date of Birth:  11-15-2000                    BSA:          1.62 m Patient Age:    18 years                      BP:           154/108 mmHg Patient Gender: F                             HR:           114 bpm. Exam Location:  Inpatient Procedure: 2D Echo Indications:    Pre-operative clearance  History:        Patient has prior history of Echocardiogram examinations, most                 recent 08/23/2019. Risk Factors:Hypertension. CKD. ESRD.  Sonographer:    Clayton Lefort RDCS (AE) Referring Phys: [place]  1. Left ventricular ejection fraction, by visual estimation, is 30 to 35%. The left ventricle has mildly decreased function. There is moderately increased left ventricular hypertrophy.  2. The left ventricle demonstrates global hypokinesis.  3. Global right ventricle has hyperdynamic systolic function.The right ventricular size is small. No increase in right ventricular wall thickness.  4. Left atrial size was moderately dilated.  5. Right atrial size was normal.  6. Large pericardial effusion.  7. The pericardial effusion is circumferential.  8. Effusion meaures up to 3.5 at the LV posterior wall.  9.  The mitral valve is abnormal. Mild to moderate mitral valve regurgitation. 10. The tricuspid valve is grossly normal. 11. The tricuspid valve is grossly normal. Tricuspid valve regurgitation is mild. 12. The aortic valve is tricuspid. Aortic valve regurgitation is not visualized. Mild aortic valve sclerosis without stenosis. 13. The pulmonic valve was grossly normal. Pulmonic valve regurgitation is not visualized. 14. The inferior vena cava is normal in size with <50% respiratory variability, suggesting right atrial pressure of 8 mmHg. 15. Hyperechoic floating structure in the RA, may be a dialysis catheter tip. In comparison to the previous echocardiogram(s): The pericardial effusion is worse and has increased in size from 1.5 to 3.5 cm.  Tamponade physiology cannot be excluded. The heart is noted to be freely swinging in the pericardium. FINDINGS  Left Ventricle: Left ventricular ejection fraction, by visual estimation, is 30 to 35%. The left ventricle has mildly decreased function. The left ventricle demonstrates global hypokinesis. There is moderately increased left ventricular hypertrophy. Right Ventricle: The right ventricular size is small. No increase in right ventricular wall thickness. Global RV systolic function is has hyperdynamic systolic function. Left Atrium: Left atrial size was moderately dilated. Right Atrium: Right atrial size was normal in size Hyperechoic floating structure in the RA, may be a dialysis catheter tip. Pericardium: A large pericardial effusion is present. The pericardial effusion is circumferential. There is no evidence of cardiac tamponade. Effusion meaures up to 3.5 at the LV posterior wall. Mitral Valve: The mitral valve is abnormal. There is mild thickening of the mitral valve leaflet(s). Mild to moderate mitral valve regurgitation. MV peak gradient, 12.7 mmHg. Tricuspid Valve: The tricuspid valve is grossly normal. Tricuspid valve regurgitation is mild. Aortic Valve: The aortic valve is tricuspid. Aortic valve regurgitation is not visualized. Mild aortic valve sclerosis is present, with no evidence of aortic valve stenosis. Aortic valve mean gradient measures 6.0 mmHg. Aortic valve peak gradient measures 10.9 mmHg. Aortic valve area, by VTI measures 2.85 cm. Pulmonic Valve: The pulmonic valve was grossly normal. Pulmonic valve regurgitation is not visualized. Pulmonic regurgitation is not visualized. Aorta: The aortic root and ascending aorta are structurally normal, with no evidence of dilitation. Venous: The inferior vena cava is normal in size with less than 50% respiratory variability, suggesting right atrial pressure of 8 mmHg. IAS/Shunts: No atrial level shunt detected by color flow Doppler.  LEFT  VENTRICLE PLAX 2D LVIDd:         5.10 cm  Diastology LVIDs:         3.90 cm  LV e' lateral:   11.60 cm/s LV PW:         1.50 cm  LV E/e' lateral: 2.7 LV IVS:        1.10 cm  LV e' medial:    10.80 cm/s LVOT diam:     2.10 cm  LV E/e' medial:  2.9 LV SV:         58 ml LV SV Index:   35.63 LVOT Area:     3.46 cm  RIGHT VENTRICLE            IVC RV Basal diam:  2.80 cm    IVC diam: 1.80 cm RV S prime:     8.27 cm/s TAPSE (M-mode): 2.0 cm LEFT ATRIUM             Index       RIGHT ATRIUM           Index LA diam:  3.50 cm 2.16 cm/m  RA Area:     12.70 cm LA Vol (A2C):   53.6 ml 33.08 ml/m RA Volume:   26.70 ml  16.48 ml/m LA Vol (A4C):   75.5 ml 46.59 ml/m LA Biplane Vol: 67.4 ml 41.59 ml/m  AORTIC VALVE AV Area (Vmax):    2.96 cm AV Area (Vmean):   2.89 cm AV Area (VTI):     2.85 cm AV Vmax:           165.00 cm/s AV Vmean:          121.000 cm/s AV VTI:            0.255 m AV Peak Grad:      10.9 mmHg AV Mean Grad:      6.0 mmHg LVOT Vmax:         141.00 cm/s LVOT Vmean:        101.000 cm/s LVOT VTI:          0.210 m LVOT/AV VTI ratio: 0.82  AORTA Ao Root diam: 3.00 cm MITRAL VALVE MV Area (PHT): 7.59 cm              SHUNTS MV Peak grad:  12.7 mmHg             Systemic VTI:  0.21 m MV Mean grad:  4.0 mmHg              Systemic Diam: 2.10 cm MV Vmax:       1.78 m/s MV Vmean:      80.9 cm/s MV VTI:        0.36 m MV PHT:        29.00 msec MV Decel Time: 100 msec MR Peak grad:    119.7 mmHg MR Mean grad:    77.0 mmHg MR Vmax:         547.00 cm/s MR Vmean:        407.0 cm/s MR PISA:         1.01 cm MR PISA Eff ROA: 6 mm MR PISA Radius:  0.40 cm MV E velocity: 31.80 cm/s  103 cm/s MV A velocity: 172.00 cm/s 70.3 cm/s MV E/A ratio:  0.18        1.5  Lyman Bishop MD Electronically signed by Lyman Bishop MD Signature Date/Time: 10/06/2019/4:43:37 PM    Final      I have independently reviewed the above radiologic studies and discussed with the patient   Recent Lab Findings: Lab Results  Component Value  Date   WBC 4.8 10/07/2019   HGB 8.5 (L) 10/07/2019   HGB 8.5 (L) 10/07/2019   HCT 25.0 (L) 10/07/2019   HCT 25.0 (L) 10/07/2019   PLT 102 (L) 10/07/2019   GLUCOSE 76 10/07/2019   CHOL 157 01/06/2014   TRIG 224 (H) 01/06/2014   HDL 38 01/06/2014   LDLCALC 74 01/06/2014   ALT 11 10/07/2019   AST 21 10/07/2019   NA 139 10/07/2019   NA 139 10/07/2019   K 3.4 (L) 10/07/2019   K 3.4 (L) 10/07/2019   CL 95 (L) 10/07/2019   CREATININE 4.48 (H) 10/07/2019   BUN 10 10/07/2019   CO2 27 10/07/2019   TSH 3.297 10/07/2019   INR 1.1 10/07/2019   HGBA1C 4.1 (L) 08/23/2019      Assessment / Plan:   Large pericardial effusion associated with nonischemic cardiomyopathy  Dialysis dependent renal failure, history of congenital nephritis  Plan subxiphoid pericardial window tomorrow, general anesthesia  and TEE.  Pericardial tissue will be sent for amyloid studies.  I discussed the procedure subxiphoid pericardial window under general anesthesia with the patient going to the benefits and risks and expected recovery and she demonstrates her understanding and agrees to proceed with surgery.    @ME1 @ 10/07/2019 5:41 PM

## 2019-10-07 NOTE — ED Notes (Signed)
Pt undressed and in hospital gown. Translucent pads placed and is comfortable at this time.

## 2019-10-08 ENCOUNTER — Inpatient Hospital Stay (HOSPITAL_COMMUNITY): Payer: Medicare Other | Admitting: Certified Registered"

## 2019-10-08 ENCOUNTER — Inpatient Hospital Stay (HOSPITAL_COMMUNITY): Payer: Medicare Other

## 2019-10-08 ENCOUNTER — Encounter (HOSPITAL_COMMUNITY)
Admission: EM | Disposition: A | Discharge status: Home / Self Care | Payer: Self-pay | Source: Home / Self Care | Attending: Cardiothoracic Surgery

## 2019-10-08 DIAGNOSIS — I5022 Chronic systolic (congestive) heart failure: Secondary | ICD-10-CM

## 2019-10-08 DIAGNOSIS — I313 Pericardial effusion (noninflammatory): Secondary | ICD-10-CM

## 2019-10-08 HISTORY — PX: SUBXYPHOID PERICARDIAL WINDOW: SHX5075

## 2019-10-08 HISTORY — PX: TEE WITHOUT CARDIOVERSION: SHX5443

## 2019-10-08 HISTORY — PX: CENTRAL VENOUS CATHETER INSERTION: SHX401

## 2019-10-08 LAB — CBC
HCT: 28.6 % — ABNORMAL LOW (ref 36.0–46.0)
Hemoglobin: 8.5 g/dL — ABNORMAL LOW (ref 12.0–15.0)
MCH: 28.4 pg (ref 26.0–34.0)
MCHC: 29.7 g/dL — ABNORMAL LOW (ref 30.0–36.0)
MCV: 95.7 fL (ref 80.0–100.0)
Platelets: 90 10*3/uL — ABNORMAL LOW (ref 150–400)
RBC: 2.99 MIL/uL — ABNORMAL LOW (ref 3.87–5.11)
RDW: 16.7 % — ABNORMAL HIGH (ref 11.5–15.5)
WBC: 5.3 10*3/uL (ref 4.0–10.5)
nRBC: 0 % (ref 0.0–0.2)

## 2019-10-08 LAB — RENAL FUNCTION PANEL
Albumin: 2.9 g/dL — ABNORMAL LOW (ref 3.5–5.0)
Anion gap: 12 (ref 5–15)
BUN: 20 mg/dL (ref 6–20)
CO2: 25 mmol/L (ref 22–32)
Calcium: 9.4 mg/dL (ref 8.9–10.3)
Chloride: 102 mmol/L (ref 98–111)
Creatinine, Ser: 6.28 mg/dL — ABNORMAL HIGH (ref 0.44–1.00)
GFR calc Af Amer: 10 mL/min — ABNORMAL LOW (ref >60)
GFR calc non Af Amer: 9 mL/min — ABNORMAL LOW (ref >60)
Glucose, Bld: 106 mg/dL — ABNORMAL HIGH (ref 70–99)
Phosphorus: 6.4 mg/dL — ABNORMAL HIGH (ref 2.5–4.6)
Potassium: 3.9 mmol/L (ref 3.5–5.1)
Sodium: 139 mmol/L (ref 135–145)

## 2019-10-08 LAB — ECHO INTRAOPERATIVE TEE
Height: 64 in
Weight: 2048 oz

## 2019-10-08 LAB — GLUCOSE, CAPILLARY
Glucose-Capillary: 85 mg/dL (ref 70–99)
Glucose-Capillary: 86 mg/dL (ref 70–99)

## 2019-10-08 SURGERY — CREATION, PERICARDIAL WINDOW, SUBXIPHOID APPROACH
Anesthesia: General | Site: Chest

## 2019-10-08 MED ORDER — PHENYLEPHRINE 40 MCG/ML (10ML) SYRINGE FOR IV PUSH (FOR BLOOD PRESSURE SUPPORT)
PREFILLED_SYRINGE | INTRAVENOUS | Status: DC | PRN
  Administered 2019-10-08: 40 ug via INTRAVENOUS

## 2019-10-08 MED ORDER — PENTAFLUOROPROP-TETRAFLUOROETH EX AERO: 1.0000 "application " | INHALATION_SPRAY | CUTANEOUS | Status: DC | PRN

## 2019-10-08 MED ORDER — LIDOCAINE-PRILOCAINE 2.5-2.5 % EX CREA
1.0000 "application " | TOPICAL_CREAM | CUTANEOUS | Status: DC | PRN
  Filled 2019-10-08: qty 5

## 2019-10-08 MED ORDER — 0.9 % SODIUM CHLORIDE (POUR BTL) OPTIME
TOPICAL | Status: DC | PRN
  Administered 2019-10-08: 1000 mL

## 2019-10-08 MED ORDER — MIDAZOLAM HCL 2 MG/2ML IJ SOLN
INTRAMUSCULAR | Status: AC
  Filled 2019-10-08: qty 2

## 2019-10-08 MED ORDER — SODIUM CHLORIDE 0.9 % IV SOLN: INTRAVENOUS | Status: DC | PRN

## 2019-10-08 MED ORDER — HEPARIN SOD (PORK) LOCK FLUSH 10 UNIT/ML IV SOLN
10.0000 [IU] | Freq: Once | INTRAVENOUS | Status: DC
  Filled 2019-10-08: qty 1

## 2019-10-08 MED ORDER — BISACODYL 5 MG PO TBEC
10.0000 mg | DELAYED_RELEASE_TABLET | Freq: Every day | ORAL | Status: DC
  Administered 2019-10-08 – 2019-10-11 (×4): 10 mg via ORAL
  Filled 2019-10-08 (×5): qty 2

## 2019-10-08 MED ORDER — PROMETHAZINE HCL 25 MG/ML IJ SOLN: 6.2500 mg | INTRAMUSCULAR | Status: DC | PRN

## 2019-10-08 MED ORDER — MIDAZOLAM HCL 2 MG/2ML IJ SOLN
INTRAMUSCULAR | Status: DC | PRN
  Administered 2019-10-08 (×2): 1 mg via INTRAVENOUS

## 2019-10-08 MED ORDER — ETONOGESTREL 68 MG ~~LOC~~ IMPL: 68.0000 mg | DRUG_IMPLANT | Freq: Once | SUBCUTANEOUS | Status: DC

## 2019-10-08 MED ORDER — CARVEDILOL 12.5 MG PO TABS
12.5000 mg | ORAL_TABLET | Freq: Two times a day (BID) | ORAL | Status: DC
  Administered 2019-10-08 – 2019-10-09 (×2): 12.5 mg via ORAL
  Filled 2019-10-08 (×2): qty 1

## 2019-10-08 MED ORDER — LABETALOL HCL 5 MG/ML IV SOLN
10.0000 mg | INTRAVENOUS | Status: DC | PRN
  Administered 2019-10-08 (×2): 10 mg via INTRAVENOUS
  Filled 2019-10-08: qty 4

## 2019-10-08 MED ORDER — HYDROMORPHONE HCL 1 MG/ML IJ SOLN
0.2500 mg | INTRAMUSCULAR | Status: DC | PRN
  Administered 2019-10-08: 10:00:00 0.5 mg via INTRAVENOUS

## 2019-10-08 MED ORDER — HYDROMORPHONE HCL 1 MG/ML IJ SOLN
INTRAMUSCULAR | Status: AC
  Filled 2019-10-08: qty 1

## 2019-10-08 MED ORDER — INSULIN ASPART 100 UNIT/ML ~~LOC~~ SOLN: 0.0000 [IU] | SUBCUTANEOUS | Status: DC

## 2019-10-08 MED ORDER — HEPARIN SODIUM (PORCINE) 1000 UNIT/ML DIALYSIS
1000.0000 [IU] | INTRAMUSCULAR | Status: DC | PRN
  Administered 2019-10-08: 21:00:00 1000 [IU] via INTRAVENOUS_CENTRAL
  Filled 2019-10-08 (×2): qty 1

## 2019-10-08 MED ORDER — SENNOSIDES-DOCUSATE SODIUM 8.6-50 MG PO TABS
1.0000 | ORAL_TABLET | Freq: Every day | ORAL | Status: DC
  Administered 2019-10-08 – 2019-10-09 (×2): 1 via ORAL
  Filled 2019-10-08 (×3): qty 1

## 2019-10-08 MED ORDER — HEPARIN SODIUM (PORCINE) 1000 UNIT/ML IJ SOLN
INTRAMUSCULAR | Status: AC
  Filled 2019-10-08: qty 4

## 2019-10-08 MED ORDER — ALTEPLASE 2 MG IJ SOLR: 2.0000 mg | Freq: Once | INTRAMUSCULAR | Status: DC | PRN

## 2019-10-08 MED ORDER — ACETAMINOPHEN 500 MG PO TABS
1000.0000 mg | ORAL_TABLET | Freq: Four times a day (QID) | ORAL | Status: DC
  Administered 2019-10-09 – 2019-10-12 (×11): 1000 mg via ORAL
  Filled 2019-10-08 (×14): qty 2

## 2019-10-08 MED ORDER — ONDANSETRON HCL 4 MG/2ML IJ SOLN
4.0000 mg | Freq: Four times a day (QID) | INTRAMUSCULAR | Status: DC | PRN
  Administered 2019-10-08: 19:00:00 4 mg via INTRAVENOUS
  Filled 2019-10-08: qty 2

## 2019-10-08 MED ORDER — PROPOFOL 10 MG/ML IV BOLUS
INTRAVENOUS | Status: AC
  Filled 2019-10-08: qty 20

## 2019-10-08 MED ORDER — NEOSTIGMINE METHYLSULFATE 10 MG/10ML IV SOLN
INTRAVENOUS | Status: DC | PRN
  Administered 2019-10-08: 4 mg via INTRAVENOUS

## 2019-10-08 MED ORDER — SODIUM CHLORIDE 0.9 % IV SOLN: 100.0000 mL | INTRAVENOUS | Status: DC | PRN

## 2019-10-08 MED ORDER — AMLODIPINE BESYLATE 5 MG PO TABS
5.0000 mg | ORAL_TABLET | Freq: Every day | ORAL | Status: DC
  Administered 2019-10-08 – 2019-10-12 (×5): 5 mg via ORAL
  Filled 2019-10-08 (×5): qty 1

## 2019-10-08 MED ORDER — PHENYLEPHRINE HCL-NACL 10-0.9 MG/250ML-% IV SOLN
INTRAVENOUS | Status: DC | PRN
  Administered 2019-10-08: 25 ug/min via INTRAVENOUS

## 2019-10-08 MED ORDER — ACETAMINOPHEN 160 MG/5ML PO SOLN
1000.0000 mg | Freq: Four times a day (QID) | ORAL | Status: DC
  Filled 2019-10-08 (×2): qty 40.6

## 2019-10-08 MED ORDER — GLYCOPYRROLATE 0.2 MG/ML IJ SOLN
INTRAMUSCULAR | Status: DC | PRN
  Administered 2019-10-08: .5 mg via INTRAVENOUS

## 2019-10-08 MED ORDER — SCOPOLAMINE 1 MG/3DAYS TD PT72
MEDICATED_PATCH | TRANSDERMAL | Status: DC | PRN
  Administered 2019-10-08: 1 via TRANSDERMAL

## 2019-10-08 MED ORDER — FENTANYL CITRATE (PF) 100 MCG/2ML IJ SOLN
50.0000 ug | INTRAMUSCULAR | Status: DC | PRN
  Administered 2019-10-08 – 2019-10-10 (×5): 50 ug via INTRAVENOUS
  Filled 2019-10-08 (×5): qty 2

## 2019-10-08 MED ORDER — ONDANSETRON HCL 4 MG/2ML IJ SOLN
INTRAMUSCULAR | Status: AC
  Filled 2019-10-08: qty 2

## 2019-10-08 MED ORDER — LIDOCAINE HCL (PF) 1 % IJ SOLN: 5.0000 mL | INTRAMUSCULAR | Status: DC | PRN

## 2019-10-08 MED ORDER — ROCURONIUM BROMIDE 10 MG/ML (PF) SYRINGE
PREFILLED_SYRINGE | INTRAVENOUS | Status: DC | PRN
  Administered 2019-10-08: 50 mg via INTRAVENOUS
  Administered 2019-10-08: 10 mg via INTRAVENOUS

## 2019-10-08 MED ORDER — HYDROCODONE-ACETAMINOPHEN 5-325 MG PO TABS
1.0000 | ORAL_TABLET | Freq: Four times a day (QID) | ORAL | Status: DC | PRN
  Administered 2019-10-08 – 2019-10-09 (×3): 1 via ORAL
  Filled 2019-10-08 (×5): qty 1

## 2019-10-08 MED ORDER — DEXAMETHASONE SODIUM PHOSPHATE 10 MG/ML IJ SOLN
INTRAMUSCULAR | Status: DC | PRN
  Administered 2019-10-08: 10 mg via INTRAVENOUS

## 2019-10-08 MED ORDER — SEVELAMER CARBONATE 800 MG PO TABS
800.0000 mg | ORAL_TABLET | Freq: Three times a day (TID) | ORAL | Status: DC
  Administered 2019-10-09 – 2019-10-12 (×11): 800 mg via ORAL
  Filled 2019-10-08 (×11): qty 1

## 2019-10-08 MED ORDER — CARVEDILOL 6.25 MG PO TABS
6.2500 mg | ORAL_TABLET | Freq: Two times a day (BID) | ORAL | Status: DC
  Filled 2019-10-08: qty 1

## 2019-10-08 MED ORDER — OXYCODONE HCL 5 MG PO TABS: 5.0000 mg | ORAL_TABLET | Freq: Once | ORAL | Status: DC | PRN

## 2019-10-08 MED ORDER — PROPOFOL 10 MG/ML IV BOLUS
INTRAVENOUS | Status: DC | PRN
  Administered 2019-10-08: 160 mg via INTRAVENOUS

## 2019-10-08 MED ORDER — TACROLIMUS ER 1 MG PO TB24
1.0000 mg | ORAL_TABLET | Freq: Every day | ORAL | Status: DC
  Administered 2019-10-09 – 2019-10-12 (×4): 1 mg via ORAL
  Filled 2019-10-08 (×6): qty 1

## 2019-10-08 MED ORDER — LIDOCAINE 2% (20 MG/ML) 5 ML SYRINGE
INTRAMUSCULAR | Status: AC
  Filled 2019-10-08: qty 5

## 2019-10-08 MED ORDER — HYDRALAZINE HCL 25 MG PO TABS
25.0000 mg | ORAL_TABLET | Freq: Once | ORAL | Status: AC
  Administered 2019-10-08: 25 mg via ORAL
  Filled 2019-10-08: qty 1

## 2019-10-08 MED ORDER — LIDOCAINE 2% (20 MG/ML) 5 ML SYRINGE
INTRAMUSCULAR | Status: DC | PRN
  Administered 2019-10-08: 100 mg via INTRAVENOUS

## 2019-10-08 MED ORDER — MEPERIDINE HCL 25 MG/ML IJ SOLN: 6.2500 mg | INTRAMUSCULAR | Status: DC | PRN

## 2019-10-08 MED ORDER — FENTANYL CITRATE (PF) 250 MCG/5ML IJ SOLN
INTRAMUSCULAR | Status: DC | PRN
  Administered 2019-10-08 (×4): 50 ug via INTRAVENOUS

## 2019-10-08 MED ORDER — CEFAZOLIN SODIUM-DEXTROSE 2-4 GM/100ML-% IV SOLN
2.0000 g | Freq: Every evening | INTRAVENOUS | Status: AC
  Administered 2019-10-08: 18:00:00 2 g via INTRAVENOUS
  Filled 2019-10-08: qty 100

## 2019-10-08 MED ORDER — DEXAMETHASONE SODIUM PHOSPHATE 10 MG/ML IJ SOLN
INTRAMUSCULAR | Status: AC
  Filled 2019-10-08: qty 1

## 2019-10-08 MED ORDER — FENTANYL CITRATE (PF) 250 MCG/5ML IJ SOLN
INTRAMUSCULAR | Status: AC
  Filled 2019-10-08: qty 5

## 2019-10-08 MED ORDER — OXYCODONE HCL 5 MG/5ML PO SOLN: 5.0000 mg | Freq: Once | ORAL | Status: DC | PRN

## 2019-10-08 MED ORDER — ONDANSETRON HCL 4 MG/2ML IJ SOLN
INTRAMUSCULAR | Status: DC | PRN
  Administered 2019-10-08: 4 mg via INTRAVENOUS

## 2019-10-08 MED ORDER — ISOSORB DINITRATE-HYDRALAZINE 20-37.5 MG PO TABS
1.0000 | ORAL_TABLET | Freq: Three times a day (TID) | ORAL | Status: AC
  Administered 2019-10-08 – 2019-10-10 (×9): 1 via ORAL
  Filled 2019-10-08 (×9): qty 1

## 2019-10-08 SURGICAL SUPPLY — 66 items
ADH SKN CLS APL DERMABOND .7 (GAUZE/BANDAGES/DRESSINGS) ×3
APL SKNCLS STERI-STRIP NONHPOA (GAUZE/BANDAGES/DRESSINGS) ×3
BENZOIN TINCTURE PRP APPL 2/3 (GAUZE/BANDAGES/DRESSINGS) ×5 IMPLANT
BIOPATCH RED 1 DISK 7.0 (GAUZE/BANDAGES/DRESSINGS) ×1 IMPLANT
BIOPATCH RED 1IN DISK 7.0MM (GAUZE/BANDAGES/DRESSINGS) ×1
BIOPATCH WHT 1IN DISK W/4.0 H (GAUZE/BANDAGES/DRESSINGS) ×2 IMPLANT
BLADE CLIPPER SURG (BLADE) ×3 IMPLANT
CANISTER SUCT 3000ML PPV (MISCELLANEOUS) ×5 IMPLANT
CATH THORACIC 28FR (CATHETERS) IMPLANT
CATH THORACIC 28FR RT ANG (CATHETERS) IMPLANT
CATH THORACIC 36FR (CATHETERS) IMPLANT
CATH THORACIC 36FR RT ANG (CATHETERS) IMPLANT
CLIP VESOCCLUDE MED 24/CT (CLIP) ×2 IMPLANT
CLIP VESOCCLUDE SM WIDE 24/CT (CLIP) ×2 IMPLANT
CLOSURE WOUND 1/2 X4 (GAUZE/BANDAGES/DRESSINGS) ×1
CNTNR URN SCR LID CUP LEK RST (MISCELLANEOUS) IMPLANT
CONN ST 1/4X3/8  BEN (MISCELLANEOUS) ×2
CONN ST 1/4X3/8 BEN (MISCELLANEOUS) IMPLANT
CONT SPEC 4OZ STRL OR WHT (MISCELLANEOUS)
COVER SURGICAL LIGHT HANDLE (MISCELLANEOUS) ×10 IMPLANT
DERMABOND ADVANCED (GAUZE/BANDAGES/DRESSINGS) ×2
DERMABOND ADVANCED .7 DNX12 (GAUZE/BANDAGES/DRESSINGS) IMPLANT
DRAIN CHANNEL 19F RND (DRAIN) ×2 IMPLANT
DRAIN CHANNEL 28F RND 3/8 FF (WOUND CARE) ×5 IMPLANT
DRAIN CONNECTOR BLAKE 1:1 (MISCELLANEOUS) ×2 IMPLANT
DRAPE HALF SHEET 40X57 (DRAPES) ×5 IMPLANT
DRAPE LAPAROSCOPIC ABDOMINAL (DRAPES) ×5 IMPLANT
DRSG COVADERM 4X6 (GAUZE/BANDAGES/DRESSINGS) ×2 IMPLANT
DRSG SORBAVIEW 3.5X5-5/16 MED (GAUZE/BANDAGES/DRESSINGS) ×4 IMPLANT
ELECT REM PT RETURN 9FT ADLT (ELECTROSURGICAL) ×5
ELECTRODE REM PT RTRN 9FT ADLT (ELECTROSURGICAL) ×3 IMPLANT
GAUZE SPONGE 4X4 12PLY STRL (GAUZE/BANDAGES/DRESSINGS) ×2 IMPLANT
GLOVE BIO SURGEON STRL SZ 6.5 (GLOVE) ×2 IMPLANT
GLOVE BIO SURGEON STRL SZ7.5 (GLOVE) ×10 IMPLANT
GLOVE BIO SURGEONS STRL SZ 6.5 (GLOVE) ×2
GLOVE SURG SS PI 6.0 STRL IVOR (GLOVE) ×2 IMPLANT
HEMOSTAT POWDER SURGIFOAM 1G (HEMOSTASIS) IMPLANT
KIT BASIN OR (CUSTOM PROCEDURE TRAY) ×5 IMPLANT
KIT TURNOVER KIT B (KITS) ×5 IMPLANT
NS IRRIG 1000ML POUR BTL (IV SOLUTION) ×5 IMPLANT
PACK CHEST (CUSTOM PROCEDURE TRAY) ×5 IMPLANT
PAD ARMBOARD 7.5X6 YLW CONV (MISCELLANEOUS) ×10 IMPLANT
PAD ELECT DEFIB RADIOL ZOLL (MISCELLANEOUS) ×5 IMPLANT
STRIP CLOSURE SKIN 1/2X4 (GAUZE/BANDAGES/DRESSINGS) ×4 IMPLANT
SUT SILK  1 MH (SUTURE) ×4
SUT SILK 1 MH (SUTURE) IMPLANT
SUT SILK 2 0 SH CR/8 (SUTURE) ×7 IMPLANT
SUT VIC AB 1 CTX 18 (SUTURE) ×7 IMPLANT
SUT VIC AB 1 CTX 36 (SUTURE) ×5
SUT VIC AB 1 CTX36XBRD ANBCTR (SUTURE) ×3 IMPLANT
SUT VIC AB 2-0 CT1 27 (SUTURE) ×5
SUT VIC AB 2-0 CT1 TAPERPNT 27 (SUTURE) IMPLANT
SUT VIC AB 3-0 X1 27 (SUTURE) ×9 IMPLANT
SWAB COLLECTION DEVICE MRSA (MISCELLANEOUS) ×2 IMPLANT
SWAB CULTURE ESWAB REG 1ML (MISCELLANEOUS) IMPLANT
SWAB CULTURE LIQUID MINI MALE (MISCELLANEOUS) ×2 IMPLANT
SYR 10ML LL (SYRINGE) IMPLANT
SYR 50ML SLIP (SYRINGE) IMPLANT
SYSTEM SAHARA CHEST DRAIN ATS (WOUND CARE) IMPLANT
TAPE CLOTH SURG 4X10 WHT LF (GAUZE/BANDAGES/DRESSINGS) ×2 IMPLANT
TOWEL GREEN STERILE (TOWEL DISPOSABLE) ×5 IMPLANT
TOWEL GREEN STERILE FF (TOWEL DISPOSABLE) ×5 IMPLANT
TRAP SPECIMEN MUCOUS 40CC (MISCELLANEOUS) ×2 IMPLANT
TRAY FOLEY MTR SLVR 14FR STAT (SET/KITS/TRAYS/PACK) ×3 IMPLANT
TRAY FOLEY SLVR 16FR TEMP STAT (SET/KITS/TRAYS/PACK) ×5 IMPLANT
WATER STERILE IRR 1000ML POUR (IV SOLUTION) ×8 IMPLANT

## 2019-10-08 NOTE — Progress Notes (Signed)
[facility] Progress Note  Subjective: seen in ICU postop this am, pt is very groggy  Vitals:   10/08/19 0925 10/08/19 0940 10/08/19 1100 10/08/19 1200  BP: (!) 146/115 (!) 146/111 124/86 125/83  Pulse: 81 80    Resp: (!) 23 (!) 23 (!) 29 (!) 26  Temp: 97.6 F (36.4 C)   98 F (36.7 C)  TempSrc:    Oral  SpO2: 98% 100% 100% 100%  Weight:      Height:        Exam: General: WDWN NAD, well appearing female Head: NCAT sclera not icteric MMM Neck: Supple. No lymphadenopathy Lungs: CTA bilaterally. No wheeze, rales or rhonchi Heart: RRR. No murmur, rubs or gallops.  Abdomen: soft, nontender, +BS, no guarding, no rebound tenderness Lower extremities:  no edema, ischemic changes, or open wounds  Neuro: Groggy postop this am Dialysis Access: TDC  RIJ   MWF [facility]  3h 43min  350/A1.5  56.5kg  2/2 bath  TDC RIJ  Hep none  Mircera 225 mcg q2wks - last 09/24/19 Parsabiv 5mg  qHD Calcitriol 2 mcg PO qHD   Assessment/Plan: 1.  Pericardial Effusion - large posterior effusion noted on ECHO 1/27 and again on [facility] 1/28. Uremic pericarditis would not be unlikely given her hx of HD noncompliance.  Is now sp pericard window this am per TCTS 2. Cardiomyopathy - ECHO EF 30-35%, mild pulm HTN. Per cardiology. 3.  ESRD -  On HD MWF.  Plan is for HD later today in ICU. 1-2 kg up.  4.  Hypertension/volume  - on norvasc/ coreg, hydralazine and IV labetalol prn.  Does not appear volume overloaded.  Has been losing weight, may need to be lowered further.  5.  Anemia of CKD - Hgb 8.5, on max ESA, due tomorrow, Aranesp 275mcg qwk(fri), follow trends, transfuse as needed  6.  Secondary Hyperparathyroidism -  Ca at goal. Will check phos. Continue VDRA and binders.  Needs parathyroidectomy but surgery postponed for cardiac clearance.   7.  Nutrition - Renal diet w/fluid restrictions once advanced.  8. Hx COVID - neg test     Kelly Splinter 10/08/2019, 12:43 PM  Inpatient medications: .  acetaminophen  1,000 mg Oral Q6H   Or  . acetaminophen (TYLENOL) oral liquid 160 mg/5 mL  1,000 mg Oral Q6H  . amLODipine  5 mg Oral Daily  . bisacodyl  10 mg Oral Daily  . carvedilol  12.5 mg Oral BID WC  . insulin aspart  0-24 Units Subcutaneous Q4H  . isosorbide-hydrALAZINE  1 tablet Oral TID  . senna-docusate  1 tablet Oral QHS  . [START ON 10/09/2019] sevelamer carbonate  800 mg Oral TID WC  . sulfamethoxazole-trimethoprim  1 tablet Oral Q M,W,F  . [START ON 10/09/2019] Tacrolimus ER  1 mg Oral Daily   . sodium chloride    . sodium chloride    . sodium chloride    .  ceFAZolin (ANCEF) IV     sodium chloride, sodium chloride, Place/Maintain arterial line **AND** sodium chloride, alteplase, fentaNYL (SUBLIMAZE) injection, heparin, HYDROcodone-acetaminophen, labetalol, lidocaine (PF), lidocaine-prilocaine, ondansetron (ZOFRAN) IV, pentafluoroprop-tetrafluoroeth Recent Labs  Lab 10/07/19 1040 10/07/19 1408 10/07/19 1414 10/07/19 1800  NA 137   < > 139  139 140  K 3.3*   < > 3.4*  3.4* 3.4*  CL 95*  --   --  96*  CO2 27  --   --  26  GLUCOSE 76  --   --  71  BUN 10  --   --  11  CREATININE 4.48*  --   --  4.50*  CALCIUM 10.1  --   --  10.4*   < > = values in this interval not displayed.

## 2019-10-08 NOTE — Anesthesia Postprocedure Evaluation (Signed)
Anesthesia Post Note  Patient: Dominique [place]  Procedure(s) Performed: SUBXYPHOID PERICARDIAL WINDOW (N/A Chest) TRANSESOPHAGEAL ECHOCARDIOGRAM (TEE) (N/A ) Insertion Central Line Adult     Patient location during evaluation: PACU Anesthesia Type: General Level of consciousness: sedated and patient cooperative Pain management: pain level controlled Vital Signs Assessment: post-procedure vital signs reviewed and stable Respiratory status: spontaneous breathing Cardiovascular status: stable Anesthetic complications: no    Last Vitals:  Vitals:   10/08/19 1400 10/08/19 1500  BP: 133/90 (!) 134/99  Pulse:    Resp: (!) 27 (!) 29  Temp:    SpO2: 100% 100%    Last Pain:  Vitals:   10/08/19 1307  TempSrc:   PainSc: Asleep                 Nolon Nations

## 2019-10-08 NOTE — Op Note (Signed)
NAME: Dominique [place], ADELSON MEDICAL RECORD [id number] ACCOUNT [id number] DATE OF BIRTH:06-27-2001 FACILITY: [facility] LOCATION: [facility] PHYSICIAN:Camey Edell VAN TRIGT III, MD  OPERATIVE REPORT  DATE OF PROCEDURE:  10/08/2019  OPERATION: 1.  Subxiphoid pericardial window, drainage of 800 mL pericardial effusion. 2.  Placement of left subclavian vein central line.  SURGEON:  Ivin Poot, MD  ASSISTANT:  Nicholes Rough PA-C  PREOPERATIVE DIAGNOSIS:  Chronic renal failure on dialysis, large pericardial effusion.  POSTOPERATIVE DIAGNOSIS:  Chronic renal failure on dialysis, large pericardial effusion.  ANESTHESIA:  General by Dr. Nolon Nations.  DESCRIPTION OF PROCEDURE:  After informed consent was documented in the preoperative area and final questions were addressed the patient was brought to the operating room in stable condition.  General anesthesia was induced.  A transesophageal echo probe  was placed, which confirmed the preoperative diagnosis of a large pericardial effusion.  The patient's chest was prepped and draped as a sterile field.  A proper time-out was performed.  During the time-out it was confirmed that she had blood available and that she had received her preoperative antibiotics as ordered.    A small incision was made centered on the xiphoid.  This was taken down to the fascia.  An elevating retractor was then placed to raise the lower aspect of the sternum, which was left intact.  The soft tissue underneath the sternum was dissected away to  expose the anterior pericardium.  An incision was made in the pericardium and clear fluid under pressure was removed.  This fluid was sent for cytology as well as culture (routine AFB and fungal).  A 3 cm oval portion of anterior pericardium was then  removed and sent for pathology and the same cultures.  The surface of the heart was examined and there was no fibrinous exudate, only mild epicardial edema.  800 mL of fluid was  removed.  Next, a dependent 28-French Bard fluted drain was placed dependently in the pericardium and was brought out through a separate incision and secured to the skin.  Next, a smaller 19-French Bard catheter was placed in the right pleural space, which had been opened during the exposure of the pericardium.  This was secured to the skin.  The retractor was removed.  The incision was closed in layers using interrupted #1 Vicryl for the fascia, running 2-0 Vicryl for subcutaneous layer, and running 3-0 Vicryl for the skin.  Sterile dressings were applied.  Next, while the patient's chest was still a sterile field a double lumen catheter was placed for central venous access using the Seldinger technique and the left subclavian vein.  It was secured to the skin and a sterile dressing was placed.  The patient was then reversed from anesthesia and returned to recovery room in stable condition.  CN/NUANCE  D:10/08/2019 T:10/08/2019 JOB:[id number]

## 2019-10-08 NOTE — Anesthesia Procedure Notes (Signed)
Procedure Name: Intubation Date/Time: 10/08/2019 7:53 AM Performed by: Bryson Corona, CRNA Pre-anesthesia Checklist: Patient identified, Emergency Drugs available, Suction available and Patient being monitored Patient Re-evaluated:Patient Re-evaluated prior to induction Oxygen Delivery Method: Circle System Utilized Preoxygenation: Pre-oxygenation with 100% oxygen Induction Type: IV induction Ventilation: Mask ventilation without difficulty Laryngoscope Size: Mac and 3 Grade View: Grade I Tube type: Oral Tube size: 7.0 mm Number of attempts: 1 Airway Equipment and Method: Stylet Placement Confirmation: ETT inserted through vocal cords under direct vision,  positive ETCO2 and breath sounds checked- equal and bilateral Secured at: 21 cm Tube secured with: Tape Dental Injury: Teeth and Oropharynx as per pre-operative assessment

## 2019-10-08 NOTE — Progress Notes (Addendum)
Advanced Heart Failure Rounding Note  PCP-Cardiologist: No primary care provider on file.   Subjective:    Had RHC/LHC 1/28 that showed normal coronary arteries, mild pulmonary HTN due to elevated left-sided pressures and high output, EF 35% by echo, and large pericardial effusion with no evidence of tamponade physiology.   Had pericardial window today 700 cc out.   Complaining of pain.      Objective:   Weight Range: 58.1 kg Body mass index is 21.97 kg/m.   Vital Signs:   Temp:  [97.6 F (36.4 C)-99.3 F (37.4 C)] 97.6 F (36.4 C) (01/29 0925) Pulse Rate:  [80-119] 80 (01/29 0940) Resp:  [0-84] 23 (01/29 0940) BP: (132-163)/(93-123) 146/111 (01/29 0940) SpO2:  [91 %-100 %] 100 % (01/29 0940) Arterial Line BP: (172-178)/(104-105) 178/104 (01/29 0940)    Weight change: Filed Weights   10/07/19 0914  Weight: 58.1 kg    Intake/Output:   Intake/Output Summary (Last 24 hours) at 10/08/2019 1122 Last data filed at 10/08/2019 1100 Gross per 24 hour  Intake 1510 ml  Output 565 ml  Net 945 ml      Physical Exam    General:  No resp difficulty HEENT: Normal Neck: Supple. JVP . Carotids 2+ bilat; no bruits. No lymphadenopathy or thyromegaly appreciated.  Cor: PMI nondisplaced. Regular rate & rhythm. No rubs, gallops or murmurs. Chest dressing intact L subclavian central line. R subclavian HD catheter.  MT  Lungs: Clear on 2 liters  Abdomen: Soft, nontender, nondistended. No hepatosplenomegaly. No bruits or masses. Good bowel sounds. Extremities: No cyanosis, clubbing, rash, edema. R groin sheath Neuro: Alert & orientedx3, cranial nerves grossly intact. moves all 4 extremities w/o difficulty. Affect pleasant   Telemetry  NSR 80s personally reviewed.    EKG    N/A  Labs    CBC Recent Labs    10/07/19 1100 10/07/19 1100 10/07/19 1408 10/07/19 1414  WBC 4.8  --   --   --   NEUTROABS 3.5  --   --   --   HGB 10.1*   < > 7.8* 8.5*  8.5*  HCT 32.2*    < > 23.0* 25.0*  25.0*  MCV 92.0  --   --   --   PLT 102*  --   --   --    < > = values in this interval not displayed.   Basic Metabolic Panel Recent Labs    10/07/19 1040 10/07/19 1408 10/07/19 1414 10/07/19 1800  NA 137   < > 139  139 140  K 3.3*   < > 3.4*  3.4* 3.4*  CL 95*  --   --  96*  CO2 27  --   --  26  GLUCOSE 76  --   --  71  BUN 10  --   --  11  CREATININE 4.48*  --   --  4.50*  CALCIUM 10.1  --   --  10.4*   < > = values in this interval not displayed.   Liver Function Tests Recent Labs    10/07/19 1040 10/07/19 1800  AST 21 22  ALT 11 12  ALKPHOS 103 96  BILITOT 1.8* 1.2  PROT 7.7 7.8  ALBUMIN 3.8 4.0   No results for input(s): LIPASE, AMYLASE in the last 72 hours. Cardiac Enzymes No results for input(s): CKTOTAL, CKMB, CKMBINDEX, TROPONINI in the last 72 hours.  BNP: BNP (last 3 results) Recent Labs    08/23/19  0441 08/24/19 0540  BNP >4,500.0* >4,500.0*    ProBNP (last 3 results) No results for input(s): PROBNP in the last 8760 hours.   D-Dimer No results for input(s): DDIMER in the last 72 hours. Hemoglobin A1C No results for input(s): HGBA1C in the last 72 hours. Fasting Lipid Panel No results for input(s): CHOL, HDL, LDLCALC, TRIG, CHOLHDL, LDLDIRECT in the last 72 hours. Thyroid Function Tests Recent Labs    10/07/19 1040  TSH 3.297    Other results:   Imaging    DG Chest 2 View  Result Date: 10/07/2019 CLINICAL DATA:  Recent diagnosis of large pericardial effusion. EXAM: CHEST - 2 VIEW COMPARISON:  October 07, 2019 FINDINGS: There is stable right internal jugular venous catheter positioning. There is no evidence of acute infiltrate, pleural effusion or pneumothorax. The cardiac silhouette is markedly enlarged and unchanged in size and appearance. A Swan-Ganz catheter is seen entering via the abdomen. Its distal tip is seen overlying the expected region of the right pulmonary artery. The visualized skeletal structures  are unremarkable. IMPRESSION: 1. Stable cardiomegaly which may represent sequelae associated with a pericardial effusion. Electronically Signed   By: Virgina Norfolk M.D.   On: 10/07/2019 18:48   CARDIAC CATHETERIZATION  Result Date: 10/07/2019 Findings: Ao = 147/113 (130) LV = 152/16 RA = 5 RV = 54/12 PA = 51/21 (35) PCW = 21 Fick cardiac output/index = 7.1/4.4 Thermo CO/CI = 7.9/4.9 PVR = 1.9 WU FA sat = 98% PA sat = 76%, 76% Assessment: 1. Normal coronary arteries 2. Mild pulmonary HTN due to elevated left-sided pressures and high output 3. EF 35% by echo 4. Large pericardial effusion with no evidence of tamponade physiology Plan/Discussion: For pericardial window tomorrow. Glori Bickers, MD 2:53 PM   DG Chest Port 1 View  Result Date: 10/08/2019 CLINICAL DATA:  Pericardial effusion. Pericardial drain in place. EXAM: PORTABLE CHEST 1 VIEW COMPARISON:  Radiographs dated 10/07/2019 FINDINGS: There has been a marked decrease in the size of the cardiac silhouette. There is 1 pericardial catheter at the base of the heart. There is a second catheter with the tip overlying the right main pulmonary artery. There is a small amount of air in the pericardium. There is consolidation at the left lung base. No pneumothorax. Double lumen central venous catheter at the right atrium, unchanged. Swan-Ganz catheter has been removed. New left subclavian catheter with the tip in the superior vena cava at the level of the carina. IMPRESSION: 1. Marked decrease in the size of the cardiac silhouette. Two pericardial tubes in place. 2. Consolidation at the left lung base. Electronically Signed   By: Lorriane Shire M.D.   On: 10/08/2019 09:48     Medications:     Scheduled Medications:  acetaminophen  1,000 mg Oral Q6H   Or   acetaminophen (TYLENOL) oral liquid 160 mg/5 mL  1,000 mg Oral Q6H   amLODipine  5 mg Oral Daily   bisacodyl  10 mg Oral Daily   carvedilol  6.25 mg Oral BID WC   etonogestrel  68 mg  Subdermal Once   heparin flush  10 Units Intracatheter Once   insulin aspart  0-24 Units Subcutaneous Q4H   senna-docusate  1 tablet Oral QHS   [START ON 10/09/2019] sevelamer carbonate  800 mg Oral TID WC   sulfamethoxazole-trimethoprim  1 tablet Oral Q M,W,F   [START ON 10/09/2019] Tacrolimus ER  1 mg Oral Daily    Infusions:  sodium chloride  ceFAZolin (ANCEF) IV      PRN Medications: Place/Maintain arterial line **AND** sodium chloride, fentaNYL (SUBLIMAZE) injection, HYDROcodone-acetaminophen, labetalol, ondansetron (ZOFRAN) IV    Assessment/Plan   1. Pericardial Effusion: large posterior effusion noted on echo 1/21, previously moderate on prior echo 12/20  -S/P Pericardial window 10/08/19 Per Dr Darcey Nora    2. Cardiomyopathy: new diagnosed made 08/2019 while admitted for covid 19 infection. EF 25-30%. Hs troponin level at time of initial diganosis was mildly elevated but flat trend, 72>>79. ? Viral Myocarditis vs HTN. Unable to get cMRi due to renal function.  - Repeat Echo 1/21 EF 30-35%, global hypokinesis, mod LVH, RV normal size and hyperdynamic systolic function -  GDMT limited by CKD (no ARB/ARNi, MRA nor digoxin) - on ? blocker therapy w/ coreg - consider Bidil for afterload reduction in place of ACE/ARB   3. 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 She is on HD MWF and followed by Dr. Justin Mend.  - management per nephrology   - Renal team notified    4. HTN Stable.    5. H/o COVID 19 Infection: - diagnosed 08/2019 - negative repeat covid test 10/05/19   6. Secondary hyperparathyroidism:  - 2/2 renal failure  - needs total parathyroidectomy but surgery postpone due to above cardiac issues   Length of Stay: 1  Amy Clegg, NP  10/08/2019, 11:22 AM  Advanced Heart Failure Team Pager [phone] (M-F; 7a - 4p)  Please contact [facility] for night-coverage  after hours (4p -7a ) and weekends on [email or web address]  Agree with above. See my note from today as well.  Glori Bickers, MD  3:15 PM

## 2019-10-08 NOTE — Progress Notes (Signed)
Pre Procedure note for inpatients:   Dominique Lawrence has been scheduled for Procedure(s): SUBXYPHOID PERICARDIAL WINDOW (N/A) TRANSESOPHAGEAL ECHOCARDIOGRAM (TEE) (N/A) today. The various methods of treatment have been discussed with the patient. After consideration of the risks, benefits and treatment options the patient has consented to the planned procedure.   The patient has been seen and labs reviewed. There are no changes in the patient's condition to prevent proceeding with the planned procedure today.  Recent labs:  Lab Results  Component Value Date   WBC 4.8 10/07/2019   HGB 8.5 (L) 10/07/2019   HGB 8.5 (L) 10/07/2019   HCT 25.0 (L) 10/07/2019   HCT 25.0 (L) 10/07/2019   PLT 102 (L) 10/07/2019   GLUCOSE 71 10/07/2019   CHOL 157 01/06/2014   TRIG 224 (H) 01/06/2014   HDL 38 01/06/2014   LDLCALC 74 01/06/2014   ALT 12 10/07/2019   AST 22 10/07/2019   NA 140 10/07/2019   K 3.4 (L) 10/07/2019   CL 96 (L) 10/07/2019   CREATININE 4.50 (H) 10/07/2019   BUN 11 10/07/2019   CO2 26 10/07/2019   TSH 3.297 10/07/2019   INR 1.1 10/07/2019   HGBA1C 4.1 (L) 08/23/2019    Len Childs, MD 10/08/2019 7:45 AM

## 2019-10-08 NOTE — Progress Notes (Signed)
Patient transported to Pre-op holding area via stretcher with this RN and OR staff member. No events during transport. Bedside report given to CRNA in pre-op including recent meds and patient allergies. Patient's VSS, denies questions at this time. Notified CRNA that the patient is wearing her home hearing aids at the request of the patient.

## 2019-10-08 NOTE — Progress Notes (Signed)
EVENING ROUNDS NOTE :     [address]       [place] [address]             [phone]                 Day of Surgery Procedure(s) (LRB): SUBXYPHOID PERICARDIAL WINDOW (N/A) TRANSESOPHAGEAL ECHOCARDIOGRAM (TEE) (N/A) Insertion Central Line Adult   Total Length of Stay:  LOS: 1 day  Events:  A-line not correlating with cuff SS output Will remove line    BP (!) 132/95   Pulse 80   Temp 98.2 F (36.8 C) (Oral)   Resp (!) 24   Ht 5\' 4"  (1.626 m)   Wt 58.1 kg   SpO2 100%   BMI 21.97 kg/m   PAP: (35-51)/(13-23) 49/21 CVP:  [0 mmHg-6 mmHg] 0 mmHg     . sodium chloride    . sodium chloride    . sodium chloride    .  ceFAZolin (ANCEF) IV 2 g (10/08/19 1756)    I/O last 3 completed shifts: In: 710 [P.O.:680; I.V.:30] Out: -    CBC Latest Ref Rng & Units 10/08/2019 10/07/2019 10/07/2019  WBC 4.0 - 10.5 K/uL 5.3 - -  Hemoglobin 12.0 - 15.0 g/dL 8.5(L) 8.5(L) 8.5(L)  Hematocrit 36.0 - 46.0 % 28.6(L) 25.0(L) 25.0(L)  Platelets 150 - 400 K/uL 90(L) - -    BMP Latest Ref Rng & Units 10/08/2019 10/07/2019 10/07/2019  Glucose 70 - 99 mg/dL 106(H) 71 -  BUN 6 - 20 mg/dL 20 11 -  Creatinine 0.44 - 1.00 mg/dL 6.28(H) 4.50(H) -  BUN/Creat Ratio 6 - 22 (calc) - - -  Sodium 135 - 145 mmol/L 139 140 139  Potassium 3.5 - 5.1 mmol/L 3.9 3.4(L) 3.4(L)  Chloride 98 - 111 mmol/L 102 96(L) -  CO2 22 - 32 mmol/L 25 26 -  Calcium 8.9 - 10.3 mg/dL 9.4 10.4(H) -    ABG    Component Value Date/Time   PHART 7.447 10/07/2019 1408   PCO2ART 42.3 10/07/2019 1408   PO2ART 107.0 10/07/2019 1408   HCO3 32.7 (H) 10/07/2019 1414   HCO3 31.8 (H) 10/07/2019 1414   TCO2 34 (H) 10/07/2019 1414   TCO2 33 (H) 10/07/2019 1414   ACIDBASEDEF 8.0 (H) 08/23/2019 0346   O2SAT 76.0 10/07/2019 1414   O2SAT 76.0 10/07/2019 [place], MD 10/08/2019 5:58 PM

## 2019-10-08 NOTE — Transfer of Care (Signed)
Immediate Anesthesia Transfer of Care Note  Patient: Dominique Lawrence  Procedure(s) Performed: SUBXYPHOID PERICARDIAL WINDOW (N/A Chest) TRANSESOPHAGEAL ECHOCARDIOGRAM (TEE) (N/A ) Insertion Central Line Adult  Patient Location: PACU  Anesthesia Type:General  Level of Consciousness: drowsy  Airway & Oxygen Therapy: Patient Spontanous Breathing and Patient connected to face mask oxygen  Post-op Assessment: Report given to RN and Post -op Vital signs reviewed and stable  Post vital signs: Reviewed and stable  Last Vitals:  Vitals Value Taken Time  BP 146/115 10/08/19 0923  Temp    Pulse 81 10/08/19 0925  Resp 23 10/08/19 0925  SpO2 98 % 10/08/19 0925  Vitals shown include unvalidated device data.  Last Pain:  Vitals:   10/08/19 0400  TempSrc: Core  PainSc: 0-No pain         Complications: No apparent anesthesia complications

## 2019-10-08 NOTE — Progress Notes (Signed)
Advanced Heart Failure Rounding Note   Subjective:    Back from OR after pericardial window. Remains sleepy but arousable. Mildly sore. No SOB.   Drained 800cc pericardial fluid. Remains hypertensive    Objective:   Weight Range:  Vital Signs:   Temp:  [97.6 F (36.4 C)-99.3 F (37.4 C)] 97.6 F (36.4 C) (01/29 0925) Pulse Rate:  [80-119] 80 (01/29 0940) Resp:  [0-84] 23 (01/29 0940) BP: (132-163)/(93-123) 146/111 (01/29 0940) SpO2:  [91 %-100 %] 100 % (01/29 0940) Arterial Line BP: (172-178)/(104-105) 178/104 (01/29 0940)    Weight change: Filed Weights   10/07/19 0914  Weight: 58.1 kg    Intake/Output:   Intake/Output Summary (Last 24 hours) at 10/08/2019 1119 Last data filed at 10/08/2019 1100 Gross per 24 hour  Intake 1510 ml  Output 565 ml  Net 945 ml     Physical Exam: General:  Sleepy but arousable after surgery HEENT: normal Neck: supple. JVP 7-8 .  RIJ perm cath Carotids 2+ bilat; no bruits. No lymphadenopathy or thryomegaly appreciated. Cor: PMI nondisplaced. Regular rate & rhythm. No rubs, gallops or murmurs. Dressing in place  Lungs: clear Abdomen: soft, nontender, nondistended. No hepatosplenomegaly. No bruits or masses. Good bowel sounds. Extremities: no cyanosis, clubbing, rash, tr edema Neuro: alert & orientedx3, cranial nerves grossly intact. moves all 4 extremities w/o difficulty. Affect pleasant  Telemetry: Sinus 80s Personally reviewed   Labs: Basic Metabolic Panel: Recent Labs  Lab 10/07/19 1040 10/07/19 1408 10/07/19 1414 10/07/19 1800  NA 137 142 139   139 140  K 3.3* 3.1* 3.4*   3.4* 3.4*  CL 95*  --   --  96*  CO2 27  --   --  26  GLUCOSE 76  --   --  71  BUN 10  --   --  11  CREATININE 4.48*  --   --  4.50*  CALCIUM 10.1  --   --  10.4*    Liver Function Tests: Recent Labs  Lab 10/07/19 1040 10/07/19 1800  AST 21 22  ALT 11 12  ALKPHOS 103 96  BILITOT 1.8* 1.2  PROT 7.7 7.8  ALBUMIN 3.8 4.0   No results  for input(s): LIPASE, AMYLASE in the last 168 hours. No results for input(s): AMMONIA in the last 168 hours.  CBC: Recent Labs  Lab 10/07/19 1100 10/07/19 1408 10/07/19 1414  WBC 4.8  --   --   NEUTROABS 3.5  --   --   HGB 10.1* 7.8* 8.5*   8.5*  HCT 32.2* 23.0* 25.0*   25.0*  MCV 92.0  --   --   PLT 102*  --   --     Cardiac Enzymes: No results for input(s): CKTOTAL, CKMB, CKMBINDEX, TROPONINI in the last 168 hours.  BNP: BNP (last 3 results) Recent Labs    08/23/19 0441 08/24/19 0540  BNP >4,500.0* >4,500.0*    ProBNP (last 3 results) No results for input(s): PROBNP in the last 8760 hours.    Other results:  Imaging: DG Chest 2 View  Result Date: 10/07/2019 CLINICAL DATA:  Recent diagnosis of large pericardial effusion. EXAM: CHEST - 2 VIEW COMPARISON:  October 07, 2019 FINDINGS: There is stable right internal jugular venous catheter positioning. There is no evidence of acute infiltrate, pleural effusion or pneumothorax. The cardiac silhouette is markedly enlarged and unchanged in size and appearance. A Swan-Ganz catheter is seen entering via the abdomen. Its distal tip is seen overlying  the expected region of the right pulmonary artery. The visualized skeletal structures are unremarkable. IMPRESSION: 1. Stable cardiomegaly which may represent sequelae associated with a pericardial effusion. Electronically Signed   By: Virgina Norfolk M.D.   On: 10/07/2019 18:48   CARDIAC CATHETERIZATION  Result Date: 10/07/2019 Findings: Ao = 147/113 (130) LV = 152/16 RA = 5 RV = 54/12 PA = 51/21 (35) PCW = 21 Fick cardiac output/index = 7.1/4.4 Thermo CO/CI = 7.9/4.9 PVR = 1.9 WU FA sat = 98% PA sat = 76%, 76% Assessment: 1. Normal coronary arteries 2. Mild pulmonary HTN due to elevated left-sided pressures and high output 3. EF 35% by echo 4. Large pericardial effusion with no evidence of tamponade physiology Plan/Discussion: For pericardial window tomorrow. Glori Bickers, MD 2:53  PM   DG Chest Port 1 View  Result Date: 10/08/2019 CLINICAL DATA:  Pericardial effusion. Pericardial drain in place. EXAM: PORTABLE CHEST 1 VIEW COMPARISON:  Radiographs dated 10/07/2019 FINDINGS: There has been a marked decrease in the size of the cardiac silhouette. There is 1 pericardial catheter at the base of the heart. There is a second catheter with the tip overlying the right main pulmonary artery. There is a small amount of air in the pericardium. There is consolidation at the left lung base. No pneumothorax. Double lumen central venous catheter at the right atrium, unchanged. Swan-Ganz catheter has been removed. New left subclavian catheter with the tip in the superior vena cava at the level of the carina. IMPRESSION: 1. Marked decrease in the size of the cardiac silhouette. Two pericardial tubes in place. 2. Consolidation at the left lung base. Electronically Signed   By: Lorriane Shire M.D.   On: 10/08/2019 09:48   DG Chest Port 1 View  Result Date: 10/07/2019 CLINICAL DATA:  Fluid around the heart on echo 10/06/2019. EXAM: PORTABLE CHEST 1 VIEW COMPARISON:  Radiograph 08/23/2019 FINDINGS: Large-bore central venous line unchanged. The cardiac silhouette is enlarged compared to prior. Small bilateral pleural effusions. No pulmonary edema. IMPRESSION: 1. Enlargement of the cardiac silhouette could indicate pericardial fluid as referenced in the clinical data. 2. Potential small effusions. 3. No pulmonary edema. Electronically Signed   By: Suzy Bouchard M.D.   On: 10/07/2019 10:54   ECHOCARDIOGRAM COMPLETE  Result Date: 10/06/2019   ECHOCARDIOGRAM REPORT   Patient Name:   Dominique Lawrence Date of Exam: 10/06/2019 Medical Rec #:  [id number]                     Height:       64.0 in Accession #:    [id number]                    Weight:       128.4 lb Date of Birth:  10/05/2000                    BSA:          1.62 m Patient Age:    18 years                      BP:           154/108  mmHg Patient Gender: F                             HR:           114 bpm.  Exam Location:  Inpatient Procedure: 2D Echo Indications:    Pre-operative clearance  History:        Patient has prior history of Echocardiogram examinations, most                 recent 08/23/2019. Risk Factors:Hypertension. CKD. ESRD.  Sonographer:    Clayton Lefort RDCS (AE) Referring Phys: [place]  1. Left ventricular ejection fraction, by visual estimation, is 30 to 35%. The left ventricle has mildly decreased function. There is moderately increased left ventricular hypertrophy.  2. The left ventricle demonstrates global hypokinesis.  3. Global right ventricle has hyperdynamic systolic function.The right ventricular size is small. No increase in right ventricular wall thickness.  4. Left atrial size was moderately dilated.  5. Right atrial size was normal.  6. Large pericardial effusion.  7. The pericardial effusion is circumferential.  8. Effusion meaures up to 3.5 at the LV posterior wall.  9. The mitral valve is abnormal. Mild to moderate mitral valve regurgitation. 10. The tricuspid valve is grossly normal. 11. The tricuspid valve is grossly normal. Tricuspid valve regurgitation is mild. 12. The aortic valve is tricuspid. Aortic valve regurgitation is not visualized. Mild aortic valve sclerosis without stenosis. 13. The pulmonic valve was grossly normal. Pulmonic valve regurgitation is not visualized. 14. The inferior vena cava is normal in size with <50% respiratory variability, suggesting right atrial pressure of 8 mmHg. 15. Hyperechoic floating structure in the RA, may be a dialysis catheter tip. In comparison to the previous echocardiogram(s): The pericardial effusion is worse and has increased in size from 1.5 to 3.5 cm. Tamponade physiology cannot be excluded. The heart is noted to be freely swinging in the pericardium. FINDINGS  Left Ventricle: Left ventricular ejection fraction, by visual estimation, is 30  to 35%. The left ventricle has mildly decreased function. The left ventricle demonstrates global hypokinesis. There is moderately increased left ventricular hypertrophy. Right Ventricle: The right ventricular size is small. No increase in right ventricular wall thickness. Global RV systolic function is has hyperdynamic systolic function. Left Atrium: Left atrial size was moderately dilated. Right Atrium: Right atrial size was normal in size Hyperechoic floating structure in the RA, may be a dialysis catheter tip. Pericardium: A large pericardial effusion is present. The pericardial effusion is circumferential. There is no evidence of cardiac tamponade. Effusion meaures up to 3.5 at the LV posterior wall. Mitral Valve: The mitral valve is abnormal. There is mild thickening of the mitral valve leaflet(s). Mild to moderate mitral valve regurgitation. MV peak gradient, 12.7 mmHg. Tricuspid Valve: The tricuspid valve is grossly normal. Tricuspid valve regurgitation is mild. Aortic Valve: The aortic valve is tricuspid. Aortic valve regurgitation is not visualized. Mild aortic valve sclerosis is present, with no evidence of aortic valve stenosis. Aortic valve mean gradient measures 6.0 mmHg. Aortic valve peak gradient measures 10.9 mmHg. Aortic valve area, by VTI measures 2.85 cm. Pulmonic Valve: The pulmonic valve was grossly normal. Pulmonic valve regurgitation is not visualized. Pulmonic regurgitation is not visualized. Aorta: The aortic root and ascending aorta are structurally normal, with no evidence of dilitation. Venous: The inferior vena cava is normal in size with less than 50% respiratory variability, suggesting right atrial pressure of 8 mmHg. IAS/Shunts: No atrial level shunt detected by color flow Doppler.  LEFT VENTRICLE PLAX 2D LVIDd:         5.10 cm  Diastology LVIDs:         3.90 cm  LV e'  lateral:   11.60 cm/s LV PW:         1.50 cm  LV E/e' lateral: 2.7 LV IVS:        1.10 cm  LV e' medial:    10.80  cm/s LVOT diam:     2.10 cm  LV E/e' medial:  2.9 LV SV:         58 ml LV SV Index:   35.63 LVOT Area:     3.46 cm  RIGHT VENTRICLE            IVC RV Basal diam:  2.80 cm    IVC diam: 1.80 cm RV S prime:     8.27 cm/s TAPSE (M-mode): 2.0 cm LEFT ATRIUM             Index       RIGHT ATRIUM           Index LA diam:        3.50 cm 2.16 cm/m  RA Area:     12.70 cm LA Vol (A2C):   53.6 ml 33.08 ml/m RA Volume:   26.70 ml  16.48 ml/m LA Vol (A4C):   75.5 ml 46.59 ml/m LA Biplane Vol: 67.4 ml 41.59 ml/m  AORTIC VALVE AV Area (Vmax):    2.96 cm AV Area (Vmean):   2.89 cm AV Area (VTI):     2.85 cm AV Vmax:           165.00 cm/s AV Vmean:          121.000 cm/s AV VTI:            0.255 m AV Peak Grad:      10.9 mmHg AV Mean Grad:      6.0 mmHg LVOT Vmax:         141.00 cm/s LVOT Vmean:        101.000 cm/s LVOT VTI:          0.210 m LVOT/AV VTI ratio: 0.82  AORTA Ao Root diam: 3.00 cm MITRAL VALVE MV Area (PHT): 7.59 cm              SHUNTS MV Peak grad:  12.7 mmHg             Systemic VTI:  0.21 m MV Mean grad:  4.0 mmHg              Systemic Diam: 2.10 cm MV Vmax:       1.78 m/s MV Vmean:      80.9 cm/s MV VTI:        0.36 m MV PHT:        29.00 msec MV Decel Time: 100 msec MR Peak grad:    119.7 mmHg MR Mean grad:    77.0 mmHg MR Vmax:         547.00 cm/s MR Vmean:        407.0 cm/s MR PISA:         1.01 cm MR PISA Eff ROA: 6 mm MR PISA Radius:  0.40 cm MV E velocity: 31.80 cm/s  103 cm/s MV A velocity: 172.00 cm/s 70.3 cm/s MV E/A ratio:  0.18        1.5  Lyman Bishop MD Electronically signed by Lyman Bishop MD Signature Date/Time: 10/06/2019/4:43:37 PM    Final       Medications:     Scheduled Medications:  acetaminophen  1,000 mg Oral Q6H   Or   acetaminophen (TYLENOL) oral  liquid 160 mg/5 mL  1,000 mg Oral Q6H   amLODipine  5 mg Oral Daily   bisacodyl  10 mg Oral Daily   carvedilol  6.25 mg Oral BID WC   etonogestrel  68 mg Subdermal Once   heparin flush  10 Units Intracatheter Once     insulin aspart  0-24 Units Subcutaneous Q4H   senna-docusate  1 tablet Oral QHS   [START ON 10/09/2019] sevelamer carbonate  800 mg Oral TID WC   sulfamethoxazole-trimethoprim  1 tablet Oral Q M,W,F   [START ON 10/09/2019] Tacrolimus ER  1 mg Oral Daily     Infusions:  sodium chloride      ceFAZolin (ANCEF) IV       PRN Medications:  Place/Maintain arterial line **AND** sodium chloride, fentaNYL (SUBLIMAZE) injection, HYDROcodone-acetaminophen, labetalol, ondansetron (ZOFRAN) IV   Assessment/Plan:    1. Pericardial Effusion: large posterior effusion noted on echo 1/21, previously moderate on prior echo 12/20  - s/p pericardial window on 1/29. 800cc fluid. Suspect transudative. Await fluid analysis. Pericardial biopsy sent for amyloid  - check ANA, ANCA, RF  2. Cardiomyopathy: new diagnosed made 08/2019 while admitted for covid 19 infection. EF 25-30%. Hs troponin level at time of initial diganosis was mildly elevated but flat trend, 72>>79. ? Viral Myocarditis vs HTN. Unable to get cMRi due to renal function.  - Repeat Echo 1/21 EF 30-35%, global hypokinesis, mod LVH, RV normal size and hyperdynamic systolic function - Plan R/L cath with no CAD - GDMT limited by CKD (no ARB/ARNi, MRA nor digoxin) - increase carvedilol - start Bidil  3. 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 She is on HD MWF and followed by Dr. Justin Mend.  - management per nephrology    4. HTN - BP elevated.   - increase carvedilol. Add Bidil   5. H/o COVID 19 Infection: - diagnosed 08/2019 - negative repeat covid test 10/05/19  6. Secondary hyperparathyroidism:  - 2/2 renal failure  - needs total parathyroidectomy but surgery postpone due to above cardiac issues     Length of Stay: 1   Glori Bickers MD 10/08/2019, 11:19 AM  Advanced Heart Failure Team Pager [phone] (M-F; 7a - 4p)   Please contact [facility] for night-coverage after hours (4p -7a ) and weekends on [email or web address]

## 2019-10-08 NOTE — Progress Notes (Signed)
PHARMACY NOTE:  ANTIMICROBIAL RENAL DOSAGE ADJUSTMENT  Current antimicrobial regimen includes a mismatch between antimicrobial dosage and estimated renal function.  As per policy approved by the Pharmacy & Therapeutics and Medical Executive Committees, the antimicrobial dosage will be adjusted accordingly.  Current antimicrobial dosage:  Cefazolin 2g IV q8h x2 (pre-op dose given ~0800)  Indication: surgical ppx  Renal Function:  Estimated Creatinine Clearance: 17.2 mL/min (A) (by C-G formula based on SCr of 4.5 mg/dL (H)). [x]      On intermittent HD, scheduled: MWF []      On CRRT    Antimicrobial dosage has been changed to:  Cefazolin 2g IV x1 tonight after planned HD  Arrie Senate, PharmD, BCPS Clinical Pharmacist [phone] Please check AMION for all [place] numbers 10/08/2019

## 2019-10-08 NOTE — Brief Op Note (Signed)
10/07/2019 - 10/08/2019  2:58 PM  PATIENT:  Lesle Reek  19 y.o. female  PRE-OPERATIVE DIAGNOSIS:  PERICARDIAL EFFUSION  POST-OPERATIVE DIAGNOSIS:  PERICARDIAL EFFUSION  PROCEDURE:  Procedure(s): SUBXYPHOID PERICARDIAL WINDOW (N/A) TRANSESOPHAGEAL ECHOCARDIOGRAM (TEE) (N/A) Insertion Central Line Adult   779ml Pericardial fluid  SURGEON:  Surgeon(s) and Role:    Ivin Poot, MD - Primary  PHYSICIAN ASSISTANT:  Nicholes Rough, PA-C   ANESTHESIA:   general  EBL:  Refer to anesthesia note  BLOOD ADMINISTERED:none  DRAINS: ROUTINE   LOCAL MEDICATIONS USED:  NONE  SPECIMEN:  Source of Specimen:  PERICARDIUM, PERICARDIAL FLUID  DISPOSITION OF SPECIMEN:  PATHOLOGY  COUNTS:  YES   DICTATION: .Dragon Dictation  PLAN OF CARE: Admit to inpatient   PATIENT DISPOSITION: PACU   Delay start of Pharmacological VTE agent (>24hrs) due to surgical blood loss or risk of bleeding: yes

## 2019-10-09 ENCOUNTER — Inpatient Hospital Stay (HOSPITAL_COMMUNITY): Payer: Medicare Other

## 2019-10-09 DIAGNOSIS — I5020 Unspecified systolic (congestive) heart failure: Secondary | ICD-10-CM

## 2019-10-09 LAB — BASIC METABOLIC PANEL
Anion gap: 12 (ref 5–15)
BUN: 17 mg/dL (ref 6–20)
CO2: 25 mmol/L (ref 22–32)
Calcium: 9.3 mg/dL (ref 8.9–10.3)
Chloride: 100 mmol/L (ref 98–111)
Creatinine, Ser: 4.99 mg/dL — ABNORMAL HIGH (ref 0.44–1.00)
GFR calc Af Amer: 13 mL/min — ABNORMAL LOW (ref >60)
GFR calc non Af Amer: 12 mL/min — ABNORMAL LOW (ref >60)
Glucose, Bld: 82 mg/dL (ref 70–99)
Potassium: 4.2 mmol/L (ref 3.5–5.1)
Sodium: 137 mmol/L (ref 135–145)

## 2019-10-09 LAB — ACID FAST SMEAR (AFB, MYCOBACTERIA): Acid Fast Smear: NEGATIVE

## 2019-10-09 LAB — GLUCOSE, CAPILLARY
Glucose-Capillary: 100 mg/dL — ABNORMAL HIGH (ref 70–99)
Glucose-Capillary: 80 mg/dL (ref 70–99)

## 2019-10-09 LAB — CBC
HCT: 25.4 % — ABNORMAL LOW (ref 36.0–46.0)
Hemoglobin: 7.8 g/dL — ABNORMAL LOW (ref 12.0–15.0)
MCH: 28.8 pg (ref 26.0–34.0)
MCHC: 30.7 g/dL (ref 30.0–36.0)
MCV: 93.7 fL (ref 80.0–100.0)
Platelets: 108 10*3/uL — ABNORMAL LOW (ref 150–400)
RBC: 2.71 MIL/uL — ABNORMAL LOW (ref 3.87–5.11)
RDW: 16.5 % — ABNORMAL HIGH (ref 11.5–15.5)
WBC: 6.9 10*3/uL (ref 4.0–10.5)
nRBC: 0 % (ref 0.0–0.2)

## 2019-10-09 MED ORDER — SODIUM CHLORIDE 0.9% FLUSH
3.0000 mL | Freq: Two times a day (BID) | INTRAVENOUS | Status: DC
  Administered 2019-10-09 – 2019-10-12 (×6): 3 mL via INTRAVENOUS

## 2019-10-09 MED ORDER — SODIUM CHLORIDE 0.9% FLUSH: 3.0000 mL | INTRAVENOUS | Status: DC | PRN

## 2019-10-09 MED ORDER — SODIUM CHLORIDE 0.9 % IV SOLN: 250.0000 mL | INTRAVENOUS | Status: DC | PRN

## 2019-10-09 MED ORDER — ~~LOC~~ CARDIAC SURGERY, PATIENT & FAMILY EDUCATION: Freq: Once | Status: AC

## 2019-10-09 MED ORDER — CARVEDILOL 25 MG PO TABS
25.0000 mg | ORAL_TABLET | Freq: Two times a day (BID) | ORAL | Status: DC
  Administered 2019-10-09 – 2019-10-12 (×6): 25 mg via ORAL
  Filled 2019-10-09 (×6): qty 1

## 2019-10-09 NOTE — Progress Notes (Signed)
Report given and patient transported to 2C09 via wheelchair with mother at her side. Belongings transported and placed in room. VSS. No visible signs of distress. Placed on 2C cardiac tele box.

## 2019-10-09 NOTE — Progress Notes (Signed)
Patients home medication, Tacrolimus, retrieved from front desk and taken to inpatient pharmacy to be verified and dosed. 24 tablets counted. 1 dose given to RN for 1000 dose. Patient verified and signed paperwork. Placed in chart. Other home medications placed in patients belongings bag. Patient will give to person of contact to take back home.

## 2019-10-09 NOTE — Progress Notes (Signed)
Right pleural tube removed with no complications. Dressing applied. Pericardial drain remains and to suction.

## 2019-10-09 NOTE — Progress Notes (Signed)
Progress Note  Patient Name: Dominique Lawrence Date of Encounter: 10/09/2019  Primary Cardiologist: Johnsie Cancel  Subjective   Chest sore   No SOB   Inpatient Medications    Scheduled Meds: . acetaminophen  1,000 mg Oral Q6H   Or  . acetaminophen (TYLENOL) oral liquid 160 mg/5 mL  1,000 mg Oral Q6H  . amLODipine  5 mg Oral Daily  . bisacodyl  10 mg Oral Daily  . carvedilol  12.5 mg Oral BID WC  . insulin aspart  0-24 Units Subcutaneous Q4H  . isosorbide-hydrALAZINE  1 tablet Oral TID  . senna-docusate  1 tablet Oral QHS  . sevelamer carbonate  800 mg Oral TID WC  . sulfamethoxazole-trimethoprim  1 tablet Oral Q M,W,F  . Tacrolimus ER  1 mg Oral Daily   Continuous Infusions: . sodium chloride    . sodium chloride    . sodium chloride     PRN Meds: sodium chloride, sodium chloride, Place/Maintain arterial line **AND** sodium chloride, alteplase, fentaNYL (SUBLIMAZE) injection, heparin, HYDROcodone-acetaminophen, labetalol, lidocaine (PF), lidocaine-prilocaine, ondansetron (ZOFRAN) IV, pentafluoroprop-tetrafluoroeth   Vital Signs    Vitals:   10/09/19 0500 10/09/19 0600 10/09/19 0700 10/09/19 0728  BP: 131/87 124/86 130/90   Pulse:      Resp: 20 20 (!) 22   Temp:    98.6 F (37 C)  TempSrc:    Oral  SpO2: 100% 100% 100%   Weight:      Height:        Intake/Output Summary (Last 24 hours) at 10/09/2019 0838 Last data filed at 10/09/2019 0600 Gross per 24 hour  Intake 1080 ml  Output 2735 ml  Net -1655 ml   NEt neg 945cc  Last 3 Weights 10/08/2019 10/08/2019 10/07/2019  Weight (lbs) 123 lb 14.4 oz 127 lb 13.9 oz 128 lb  Weight (kg) 56.2 kg 58 kg 58.06 kg      Telemetry    SR - Personally Reviewed  ECG    - Personally Reviewed  Physical Exam   GEN: No acute distress.   Neck: No JVD Cardiac: RRR, no murmurs, rubs, or gallops.  Chest:  Bandages in place  No bleeding   Respiratory: Clear to auscultation bilaterally anteirorly   GI: Soft,  Deferred   MS: No edema; No deformity.2+ pulses   Neuro:  Nonfocal  Psych: Normal affect   Labs    High Sensitivity Troponin:  No results for input(s): TROPONINIHS in the last 720 hours.    Chemistry Recent Labs  Lab 10/07/19 1040 10/07/19 1408 10/07/19 1800 10/08/19 1149 10/09/19 0447  NA 137   < > 140 139 137  K 3.3*   < > 3.4* 3.9 4.2  CL 95*   < > 96* 102 100  CO2 27   < > 26 25 25   GLUCOSE 76   < > 71 106* 82  BUN 10   < > 11 20 17   CREATININE 4.48*   < > 4.50* 6.28* 4.99*  CALCIUM 10.1   < > 10.4* 9.4 9.3  PROT 7.7  --  7.8  --   --   ALBUMIN 3.8  --  4.0 2.9*  --   AST 21  --  22  --   --   ALT 11  --  12  --   --   ALKPHOS 103  --  96  --   --   BILITOT 1.8*  --  1.2  --   --  GFRNONAA 13*   < > 13* 9* 12*  GFRAA 15*   < > 15* 10* 13*  ANIONGAP 15   < > 18* 12 12   < > = values in this interval not displayed.     Hematology Recent Labs  Lab 10/07/19 1100 10/07/19 1408 10/07/19 1414 10/08/19 1149 10/09/19 0447  WBC 4.8  --   --  5.3 6.9  RBC 3.50*  --   --  2.99* 2.71*  HGB 10.1*   < > 8.5*  8.5* 8.5* 7.8*  HCT 32.2*   < > 25.0*  25.0* 28.6* 25.4*  MCV 92.0  --   --  95.7 93.7  MCH 28.9  --   --  28.4 28.8  MCHC 31.4  --   --  29.7* 30.7  RDW 16.8*  --   --  16.7* 16.5*  PLT 102*  --   --  90* 108*   < > = values in this interval not displayed.    BNPNo results for input(s): BNP, PROBNP in the last 168 hours.   DDimer No results for input(s): DDIMER in the last 168 hours.   Radiology    DG Chest 2 View  Result Date: 10/07/2019 CLINICAL DATA:  Recent diagnosis of large pericardial effusion. EXAM: CHEST - 2 VIEW COMPARISON:  October 07, 2019 FINDINGS: There is stable right internal jugular venous catheter positioning. There is no evidence of acute infiltrate, pleural effusion or pneumothorax. The cardiac silhouette is markedly enlarged and unchanged in size and appearance. A Swan-Ganz catheter is seen entering via the abdomen. Its distal tip is  seen overlying the expected region of the right pulmonary artery. The visualized skeletal structures are unremarkable. IMPRESSION: 1. Stable cardiomegaly which may represent sequelae associated with a pericardial effusion. Electronically Signed   By: Virgina Norfolk M.D.   On: 10/07/2019 18:48   CARDIAC CATHETERIZATION  Result Date: 10/07/2019 Findings: Ao = 147/113 (130) LV = 152/16 RA = 5 RV = 54/12 PA = 51/21 (35) PCW = 21 Fick cardiac output/index = 7.1/4.4 Thermo CO/CI = 7.9/4.9 PVR = 1.9 WU FA sat = 98% PA sat = 76%, 76% Assessment: 1. Normal coronary arteries 2. Mild pulmonary HTN due to elevated left-sided pressures and high output 3. EF 35% by echo 4. Large pericardial effusion with no evidence of tamponade physiology Plan/Discussion: For pericardial window tomorrow. Glori Bickers, MD 2:53 PM   DG Chest Port 1 View  Result Date: 10/09/2019 CLINICAL DATA:  Pericardial effusion. EXAM: PORTABLE CHEST 1 VIEW COMPARISON:  Chest radiograph 10/08/2019 FINDINGS: Central venous catheter tip projects over the superior vena cava. Subclavian central venous catheter tip projects over the superior vena cava. Pericardial drainage catheters are demonstrated. Small amount of pneumopericardium. Low lung volumes. Retrocardiac consolidation, similar to prior. No definite pleural effusion or pneumothorax. IMPRESSION: Stable support apparatus as above. Persistent small pneumopericardium. Persistent retrocardiac consolidation. Electronically Signed   By: Lovey Newcomer M.D.   On: 10/09/2019 07:02   DG Chest Port 1 View  Result Date: 10/08/2019 CLINICAL DATA:  Pericardial effusion. Pericardial drain in place. EXAM: PORTABLE CHEST 1 VIEW COMPARISON:  Radiographs dated 10/07/2019 FINDINGS: There has been a marked decrease in the size of the cardiac silhouette. There is 1 pericardial catheter at the base of the heart. There is a second catheter with the tip overlying the right main pulmonary artery. There is a small  amount of air in the pericardium. There is consolidation at the left lung base. No  pneumothorax. Double lumen central venous catheter at the right atrium, unchanged. Swan-Ganz catheter has been removed. New left subclavian catheter with the tip in the superior vena cava at the level of the carina. IMPRESSION: 1. Marked decrease in the size of the cardiac silhouette. Two pericardial tubes in place. 2. Consolidation at the left lung base. Electronically Signed   By: Lorriane Shire M.D.   On: 10/08/2019 09:48   DG Chest Port 1 View  Result Date: 10/07/2019 CLINICAL DATA:  Fluid around the heart on echo 10/06/2019. EXAM: PORTABLE CHEST 1 VIEW COMPARISON:  Radiograph 08/23/2019 FINDINGS: Large-bore central venous line unchanged. The cardiac silhouette is enlarged compared to prior. Small bilateral pleural effusions. No pulmonary edema. IMPRESSION: 1. Enlargement of the cardiac silhouette could indicate pericardial fluid as referenced in the clinical data. 2. Potential small effusions. 3. No pulmonary edema. Electronically Signed   By: Suzy Bouchard M.D.   On: 10/07/2019 10:54    Cardiac Studies   Echo 10/06/19  1. Left ventricular ejection fraction, by visual estimation, is 30 to 35%. The left ventricle has mildly decreased function. There is moderately increased left ventricular hypertrophy. 2. The left ventricle demonstrates global hypokinesis. 3. Global right ventricle has hyperdynamic systolic function.The right ventricular size is small. No increase in right ventricular wall thickness. 4. Left atrial size was moderately dilated. 5. Right atrial size was normal. 6. Large pericardial effusion. 7. The pericardial effusion is circumferential. 8. Effusion meaures up to 3.5 at the LV posterior wall. 9. The mitral valve is abnormal. Mild to moderate mitral valve regurgitation. 10. The tricuspid valve is grossly normal. 11. The tricuspid valve is grossly normal. Tricuspid valve regurgitation is  mild. 12. The aortic valve is tricuspid. Aortic valve regurgitation is not visualized. Mild aortic valve sclerosis without stenosis. 13. The pulmonic valve was grossly normal. Pulmonic valve regurgitation is not visualized. 14. The inferior vena cava is normal in size with <50% respiratory variability, suggesting right atrial pressure of 8 mmHg. 15. Hyperechoic floating structure in the RA, may be a dialysis catheter tip. In comparison to the previous echocardiogram(s): The pericardial effusion is worse and has increased in size from 1.5 to 3.5 cm. Tamponade physiology cannot be excluded. The heart is noted to be freely swinging in the pericardium.   R/L heart cath    10/07/19   Ao = 147/113 (130) LV = 152/16 RA = 5 RV = 54/12 PA = 51/21 (35) PCW = 21 Fick cardiac output/index = 7.1/4.4 Thermo CO/CI = 7.9/4.9 PVR = 1.9 WU FA sat = 98% PA sat = 76%, 76%  Assessment: 1. Normal coronary arteries 2. Mild pulmonary HTN due to elevated left-sided pressures and high output 3. EF 35% by echo 4. Large pericardial effusion with no evidence of tamponade physiology  Plan/Discussion  Patient Profile     19 y.o. 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 Due to myocarditis) and pericardial effusion.  Admitted for [facility] poss pericardial window  Assessment & Plan    1.  Pericardial effusion  800 cc fluid drained.  S/p window   Last shift 20cc output for drainage tube   Serologies pending   2  Hx Systolic CHF   LVEF 25 to 30%    ? Viral myocarditis vis HTN   Unable to perform MRI  With renal dz.   Cath showed Normal coronary arteries    Currently on Coreg 12.5 bid ;  BiDIl 20/37.5 tid and amlodipine    Will increase Coreg. 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  Failed transplant in 12/19  Nephrectomy 8/20   On HD    REnal following     5  Hyperparathyroisidm  Secondary  Plan for surgery when current contdition improves  6  HTN  Continue to titrate meds     7  Hx COVID 19 infection   12/20   Repeat test 1/26 negative     For questions or updates, please contact [place] Please consult [email or web address] for contact info under        Signed, Dorris Carnes, MD  10/09/2019, 8:38 AM

## 2019-10-09 NOTE — Progress Notes (Signed)
[facility] Progress Note  Subjective: had HD yest w/ 2L off. VSS, BP 130 80. Lytes are good, Hb 7.8  Vitals:   10/09/19 0700 10/09/19 0728 10/09/19 0900 10/09/19 1000  BP: 130/90  130/86 113/63  Pulse:      Resp: (!) 22  (!) 39 (!) 32  Temp:  98.6 F (37 C)    TempSrc:  Oral    SpO2: 100%  97% 96%  Weight:      Height:        Exam: General: WDWN NAD, well appearing female Lungs: CTA bilaterally. No wheeze, rales or rhonchi Heart: RRR. No murmur, rubs or gallops. Chest tube x2 in place Abdomen: soft, nontender, +BS, no guarding, no rebound tenderness Lower extremities:  no edema, ischemic changes, or open wounds  Neuro: alert, nonfocal, Ox 3 Dialysis Access: [facility]  RIJ   Home meds:  - amloidipine 10/ carvedilol 6.25 bid/ furosemide 40 2x per wk/   - tacrolimus ER 1mg  qd  - omeprazole 40 qd/ bactrim 1 400-80 on mwf/ renvela ac tid  - prn's/ vitamins/ supplements    [facility]  3h 39min  350/A1.5  56.5kg  2/2 bath  TDC RIJ  Hep none  Mircera 225 mcg q2wks - last 09/24/19 Parsabiv 5mg  qHD Calcitriol 2 mcg PO qHD   Assessment/Plan: 1.  Pericardial Effusion - sp pericardial window on 1/29 by TCTS. Per primary team.  2. Cardiomyopathy - ECHO EF 30-35%, mild pulm HTN 3.  ESRD -  On HD MWF.  Next HD Monday.  4.  Hypertension/volume  - on home norvasc/ coreg, bidil added here and IV labetalol prn. At dry wt today. Maybe lower a bit while here.  5.  Anemia of CKD - Hgb 8.5, on max ESA Aranesp 247mcg qFri as outpt, will reorder here.  6.  Secondary Hyperparathyroidism -  Ca at goal. Will check phos. Continue VDRA and binders.  Needs parathyroidectomy but surgery postponed for cardiac clearance.   7.  Nutrition - Renal diet w/fluid restrictions once advanced.  8. Hx COVID - neg test 9. H/o failed renal Tx - on low dose prograf 1mg  /day     Rob Bijal Siglin 10/09/2019, 10:23 AM  Inpatient medications: . acetaminophen  1,000 mg Oral Q6H   Or  . acetaminophen  (TYLENOL) oral liquid 160 mg/5 mL  1,000 mg Oral Q6H  . amLODipine  5 mg Oral Daily  . bisacodyl  10 mg Oral Daily  . carvedilol  25 mg Oral BID WC  . insulin aspart  0-24 Units Subcutaneous Q4H  . isosorbide-hydrALAZINE  1 tablet Oral TID  . senna-docusate  1 tablet Oral QHS  . sevelamer carbonate  800 mg Oral TID WC  . sodium chloride flush  3 mL Intravenous Q12H  . sulfamethoxazole-trimethoprim  1 tablet Oral Q M,W,F  . Tacrolimus ER  1 mg Oral Daily   . sodium chloride    . sodium chloride    . sodium chloride    . sodium chloride     sodium chloride, sodium chloride, Place/Maintain arterial line **AND** sodium chloride, sodium chloride, alteplase, fentaNYL (SUBLIMAZE) injection, heparin, HYDROcodone-acetaminophen, labetalol, lidocaine (PF), lidocaine-prilocaine, ondansetron (ZOFRAN) IV, pentafluoroprop-tetrafluoroeth, sodium chloride flush Recent Labs  Lab 10/08/19 1149 10/09/19 0447  NA 139 137  K 3.9 4.2  CL 102 100  CO2 25 25  GLUCOSE 106* 82  BUN 20 17  CREATININE 6.28* 4.99*  CALCIUM 9.4 9.3  PHOS 6.4*  --

## 2019-10-09 NOTE — Progress Notes (Signed)
      [address]       [place] [address]             [phone]                 1 Day Post-Op Procedure(s) (LRB): SUBXYPHOID PERICARDIAL WINDOW (N/A) TRANSESOPHAGEAL ECHOCARDIOGRAM (TEE) (N/A) Insertion Central Line Adult   Events: No events Some pain at the chest tube sites _______________________________________________________________ Vitals: BP 130/86   Pulse 84   Temp 98.6 F (37 C) (Oral)   Resp (!) 39   Ht 5\' 4"  (1.626 m)   Wt 56.2 kg   SpO2 97%   BMI 21.27 kg/m   - Neuro: alert NAD  - Cardiovascular: sinus  Drips: none.      - Pulm: EWOB, good sats on room air    ABG    Component Value Date/Time   PHART 7.447 10/07/2019 1408   PCO2ART 42.3 10/07/2019 1408   PO2ART 107.0 10/07/2019 1408   HCO3 32.7 (H) 10/07/2019 1414   HCO3 31.8 (H) 10/07/2019 1414   TCO2 34 (H) 10/07/2019 1414   TCO2 33 (H) 10/07/2019 1414   ACIDBASEDEF 8.0 (H) 08/23/2019 0346   O2SAT 76.0 10/07/2019 1414   O2SAT 76.0 10/07/2019 1414    - Abd: soft,    .Intake/Output      01/29 0701 - 01/30 0700 01/30 0701 - 01/31 0700   P.O. 140    I.V. (mL/kg) 840 (14.9)    IV Piggyback 100    Total Intake(mL/kg) 1080 (19.2)    Other 2000    Chest Tube 735 30   Total Output 2735 30   Net -1655 -30           _______________________________________________________________ Labs: CBC Latest Ref Rng & Units 10/09/2019 10/08/2019 10/07/2019  WBC 4.0 - 10.5 K/uL 6.9 5.3 -  Hemoglobin 12.0 - 15.0 g/dL 7.8(L) 8.5(L) 8.5(L)  Hematocrit 36.0 - 46.0 % 25.4(L) 28.6(L) 25.0(L)  Platelets 150 - 400 K/uL 108(L) 90(L) -   CMP Latest Ref Rng & Units 10/09/2019 10/08/2019 10/07/2019  Glucose 70 - 99 mg/dL 82 106(H) 71  BUN 6 - 20 mg/dL 17 20 11   Creatinine 0.44 - 1.00 mg/dL 4.99(H) 6.28(H) 4.50(H)  Sodium 135 - 145 mmol/L 137 139 140  Potassium 3.5 - 5.1 mmol/L 4.2 3.9 3.4(L)  Chloride 98 - 111 mmol/L 100 102 96(L)  CO2 22 - 32 mmol/L 25 25 26   Calcium 8.9 - 10.3 mg/dL 9.3  9.4 10.4(H)  Total Protein 6.5 - 8.1 g/dL - - 7.8  Total Bilirubin 0.3 - 1.2 mg/dL - - 1.2  Alkaline Phos 38 - 126 U/L - - 96  AST 15 - 41 U/L - - 22  ALT 0 - 44 U/L - - 12    CXR: No pneumothorax Clear lung fields  _______________________________________________________________  Assessment and Plan: POD 1 s/p subxiphoid pericardial window  Neuro: improve pain control CV: will keep pericardial drain for now Pulm: will remove pleural drain.  pulm toilet Renal: potassium stable.  Will defer to nephrology GI: on diet Heme: stable ID: [email or web address] Dispo: floor today  Melodie Bouillon, MD 10/09/2019 9:15 AM

## 2019-10-10 LAB — CBC
HCT: 24.4 % — ABNORMAL LOW (ref 36.0–46.0)
Hemoglobin: 7.5 g/dL — ABNORMAL LOW (ref 12.0–15.0)
MCH: 29.4 pg (ref 26.0–34.0)
MCHC: 30.7 g/dL (ref 30.0–36.0)
MCV: 95.7 fL (ref 80.0–100.0)
Platelets: 89 10*3/uL — ABNORMAL LOW (ref 150–400)
RBC: 2.55 MIL/uL — ABNORMAL LOW (ref 3.87–5.11)
RDW: 17.1 % — ABNORMAL HIGH (ref 11.5–15.5)
WBC: 6.7 10*3/uL (ref 4.0–10.5)
nRBC: 0 % (ref 0.0–0.2)

## 2019-10-10 LAB — COMPREHENSIVE METABOLIC PANEL
ALT: 7 U/L (ref 0–44)
AST: 12 U/L — ABNORMAL LOW (ref 15–41)
Albumin: 2.5 g/dL — ABNORMAL LOW (ref 3.5–5.0)
Alkaline Phosphatase: 66 U/L (ref 38–126)
Anion gap: 12 (ref 5–15)
BUN: 33 mg/dL — ABNORMAL HIGH (ref 6–20)
CO2: 25 mmol/L (ref 22–32)
Calcium: 9 mg/dL (ref 8.9–10.3)
Chloride: 98 mmol/L (ref 98–111)
Creatinine, Ser: 7.36 mg/dL — ABNORMAL HIGH (ref 0.44–1.00)
GFR calc Af Amer: 8 mL/min — ABNORMAL LOW (ref >60)
GFR calc non Af Amer: 7 mL/min — ABNORMAL LOW (ref >60)
Glucose, Bld: 85 mg/dL (ref 70–99)
Potassium: 4 mmol/L (ref 3.5–5.1)
Sodium: 135 mmol/L (ref 135–145)
Total Bilirubin: 0.4 mg/dL (ref 0.3–1.2)
Total Protein: 5.1 g/dL — ABNORMAL LOW (ref 6.5–8.1)

## 2019-10-10 LAB — RHEUMATOID FACTOR: Rheumatoid fact SerPl-aCnc: <10 IU/mL (ref 0.0–13.9)

## 2019-10-10 MED ORDER — CHLORHEXIDINE GLUCONATE CLOTH 2 % EX PADS: 6.0000 | MEDICATED_PAD | Freq: Every day | CUTANEOUS | Status: DC

## 2019-10-10 MED ORDER — ISOSORB DINITRATE-HYDRALAZINE 20-37.5 MG PO TABS
2.0000 | ORAL_TABLET | Freq: Three times a day (TID) | ORAL | Status: DC
  Administered 2019-10-11 – 2019-10-12 (×4): 2 via ORAL
  Filled 2019-10-10 (×5): qty 2

## 2019-10-10 MED ORDER — POVIDONE-IODINE 7.5 % EX SOLN
CUTANEOUS | Status: DC | PRN
  Filled 2019-10-10: qty 118

## 2019-10-10 NOTE — Progress Notes (Signed)
Progress Note  Patient Name: Dominique Lawrence Date of Encounter: 10/10/2019  Primary Cardiologist: Johnsie Cancel  First visit last week  Subjective   Chest feels better than yesterday  Breathing is OK   Inpatient Medications    Scheduled Meds: . acetaminophen  1,000 mg Oral Q6H   Or  . acetaminophen (TYLENOL) oral liquid 160 mg/5 mL  1,000 mg Oral Q6H  . amLODipine  5 mg Oral Daily  . bisacodyl  10 mg Oral Daily  . carvedilol  25 mg Oral BID WC  . isosorbide-hydrALAZINE  1 tablet Oral TID  . senna-docusate  1 tablet Oral QHS  . sevelamer carbonate  800 mg Oral TID WC  . sodium chloride flush  3 mL Intravenous Q12H  . sulfamethoxazole-trimethoprim  1 tablet Oral Q M,W,F  . Tacrolimus ER  1 mg Oral Daily   Continuous Infusions: . sodium chloride    . sodium chloride    . sodium chloride    . sodium chloride     PRN Meds: sodium chloride, sodium chloride, Place/Maintain arterial line **AND** sodium chloride, sodium chloride, alteplase, fentaNYL (SUBLIMAZE) injection, heparin, HYDROcodone-acetaminophen, labetalol, ondansetron (ZOFRAN) IV, sodium chloride flush   Vital Signs    Vitals:   10/09/19 1900 10/10/19 0007 10/10/19 0303 10/10/19 0800  BP: 114/64 (!) 107/58 120/76 124/89  Pulse: 95 83 90   Resp: (!) 37 20 (!) 28   Temp: 98.1 F (36.7 C) 98.5 F (36.9 C) 98.1 F (36.7 C) 98.8 F (37.1 C)  TempSrc: Oral Oral Oral Oral  SpO2: 100% 98% 98% 98%  Weight:      Height:        Intake/Output Summary (Last 24 hours) at 10/10/2019 0854 Last data filed at 10/10/2019 0510 Gross per 24 hour  Intake 603 ml  Output 160 ml  Net 443 ml   NEt neg 945cc  Last 3 Weights 10/08/2019 10/08/2019 10/07/2019  Weight (lbs) 123 lb 14.4 oz 127 lb 13.9 oz 128 lb  Weight (kg) 56.2 kg 58 kg 58.06 kg      Telemetry    SR - Personally Reviewed  ECG    None  Personally Reviewed  Physical Exam   GEN: No acute distress.   Neck: No JVD Cardiac: RRR, no murmurs  S1, S2   No S3    Chest:  Bandages in place  No bleeding   Respiratory: Clear to auscultation bilaterally anteirorly   GI: Soft, Deferred   MS: No edema; No deformity.2+ pulses   Neuro:  Nonfocal  Psych: Normal affect   Labs    High Sensitivity Troponin:  No results for input(s): TROPONINIHS in the last 720 hours.    Chemistry Recent Labs  Lab 10/07/19 1040 10/07/19 1408 10/07/19 1800 10/07/19 1800 10/08/19 1149 10/09/19 0447 10/10/19 0513  NA 137   < > 140   < > 139 137 135  K 3.3*   < > 3.4*   < > 3.9 4.2 4.0  CL 95*   < > 96*   < > 102 100 98  CO2 27   < > 26   < > 25 25 25   GLUCOSE 76   < > 71   < > 106* 82 85  BUN 10   < > 11   < > 20 17 33*  CREATININE 4.48*   < > 4.50*   < > 6.28* 4.99* 7.36*  CALCIUM 10.1   < > 10.4*   < > 9.4  9.3 9.0  PROT 7.7  --  7.8  --   --   --  5.1*  ALBUMIN 3.8   < > 4.0  --  2.9*  --  2.5*  AST 21  --  22  --   --   --  12*  ALT 11  --  12  --   --   --  7  ALKPHOS 103  --  96  --   --   --  66  BILITOT 1.8*  --  1.2  --   --   --  0.4  GFRNONAA 13*   < > 13*   < > 9* 12* 7*  GFRAA 15*   < > 15*   < > 10* 13* 8*  ANIONGAP 15   < > 18*   < > 12 12 12    < > = values in this interval not displayed.     Hematology Recent Labs  Lab 10/08/19 1149 10/09/19 0447 10/10/19 0513  WBC 5.3 6.9 6.7  RBC 2.99* 2.71* 2.55*  HGB 8.5* 7.8* 7.5*  HCT 28.6* 25.4* 24.4*  MCV 95.7 93.7 95.7  MCH 28.4 28.8 29.4  MCHC 29.7* 30.7 30.7  RDW 16.7* 16.5* 17.1*  PLT 90* 108* 89*    BNPNo results for input(s): BNP, PROBNP in the last 168 hours.   DDimer No results for input(s): DDIMER in the last 168 hours.   Radiology    DG Chest Port 1 View  Result Date: 10/09/2019 CLINICAL DATA:  Pericardial effusion. EXAM: PORTABLE CHEST 1 VIEW COMPARISON:  Chest radiograph 10/08/2019 FINDINGS: Central venous catheter tip projects over the superior vena cava. Subclavian central venous catheter tip projects over the superior vena cava. Pericardial drainage catheters  are demonstrated. Small amount of pneumopericardium. Low lung volumes. Retrocardiac consolidation, similar to prior. No definite pleural effusion or pneumothorax. IMPRESSION: Stable support apparatus as above. Persistent small pneumopericardium. Persistent retrocardiac consolidation. Electronically Signed   By: Lovey Newcomer M.D.   On: 10/09/2019 07:02   DG Chest Port 1 View  Result Date: 10/08/2019 CLINICAL DATA:  Pericardial effusion. Pericardial drain in place. EXAM: PORTABLE CHEST 1 VIEW COMPARISON:  Radiographs dated 10/07/2019 FINDINGS: There has been a marked decrease in the size of the cardiac silhouette. There is 1 pericardial catheter at the base of the heart. There is a second catheter with the tip overlying the right main pulmonary artery. There is a small amount of air in the pericardium. There is consolidation at the left lung base. No pneumothorax. Double lumen central venous catheter at the right atrium, unchanged. Swan-Ganz catheter has been removed. New left subclavian catheter with the tip in the superior vena cava at the level of the carina. IMPRESSION: 1. Marked decrease in the size of the cardiac silhouette. Two pericardial tubes in place. 2. Consolidation at the left lung base. Electronically Signed   By: Lorriane Shire M.D.   On: 10/08/2019 09:48    Cardiac Studies   Echo 10/06/19  1. Left ventricular ejection fraction, by visual estimation, is 30 to 35%. The left ventricle has mildly decreased function. There is moderately increased left ventricular hypertrophy. 2. The left ventricle demonstrates global hypokinesis. 3. Global right ventricle has hyperdynamic systolic function.The right ventricular size is small. No increase in right ventricular wall thickness. 4. Left atrial size was moderately dilated. 5. Right atrial size was normal. 6. Large pericardial effusion. 7. The pericardial effusion is circumferential. 8. Effusion meaures up to 3.5 at  the LV posterior wall. 9.  The mitral valve is abnormal. Mild to moderate mitral valve regurgitation. 10. The tricuspid valve is grossly normal. 11. The tricuspid valve is grossly normal. Tricuspid valve regurgitation is mild. 12. The aortic valve is tricuspid. Aortic valve regurgitation is not visualized. Mild aortic valve sclerosis without stenosis. 13. The pulmonic valve was grossly normal. Pulmonic valve regurgitation is not visualized. 14. The inferior vena cava is normal in size with <50% respiratory variability, suggesting right atrial pressure of 8 mmHg. 15. Hyperechoic floating structure in the RA, may be a dialysis catheter tip. In comparison to the previous echocardiogram(s): The pericardial effusion is worse and has increased in size from 1.5 to 3.5 cm. Tamponade physiology cannot be excluded. The heart is noted to be freely swinging in the pericardium.   R/L heart cath    10/07/19   Ao = 147/113 (130) LV = 152/16 RA = 5 RV = 54/12 PA = 51/21 (35) PCW = 21 Fick cardiac output/index = 7.1/4.4 Thermo CO/CI = 7.9/4.9 PVR = 1.9 WU FA sat = 98% PA sat = 76%, 76%  Assessment: 1. Normal coronary arteries 2. Mild pulmonary HTN due to elevated left-sided pressures and high output 3. EF 35% by echo 4. Large pericardial effusion with no evidence of tamponade physiology  Plan/Discussion  Patient Profile     19 y.o. 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 Due to myocarditis) and pericardial effusion.  Admitted for [facility] poss pericardial window  Assessment & Plan    1.  Pericardial effusion  800 cc fluid drained.  S/p window  Drain in place  TCTS to follow  Serologies are still pending    2  Hx Systolic CHF   LVEF 25 to 30%    ? Viral myocarditis vis HTN   Unable to perform MRI  With renal dz.   Cath showed Normal coronary arteries    Currently on Coreg ; BiDIl 20/37.5 tid and amlodipine     3  MV dz   Mild to mod MR     4  CRF   Secondary to Alport Syndrome.  Failed transplant in 12/19  Nephrectomy 8/20   On HD   Renal service is following    5  Hyperparathyroisidm  Secondary  Plan for surgery when current contdition improves  6  HTN  =BP is improving    Wlll dc amlodipine and increase bidil tomorrow   On Coreg 25 bid   Just advanced yesterday     7  Hx COVID 19 infection   12/20   Repeat test 1/26 negative     For questions or updates, please contact [place] Please consult [email or web address] for contact info under        Signed, Dorris Carnes, MD  10/10/2019, 8:54 AM

## 2019-10-10 NOTE — Progress Notes (Signed)
      [address]       ,[place] [address]             [phone]                 2 Days Post-Op Procedure(s) (LRB): SUBXYPHOID PERICARDIAL WINDOW (N/A) TRANSESOPHAGEAL ECHOCARDIOGRAM (TEE) (N/A) Insertion Central Line Adult   Events: No events  _______________________________________________________________ Vitals: BP 124/89 (BP Location: Left Arm)   Pulse 90   Temp 98.8 F (37.1 C) (Oral)   Resp (!) 28   Ht 5\' 4"  (1.626 m)   Wt 56.2 kg   SpO2 98%   BMI 21.27 kg/m   - Neuro: alert NAD  - Cardiovascular: sinus  - Pulm: EWOB, good sats on room air    ABG    Component Value Date/Time   PHART 7.447 10/07/2019 1408   PCO2ART 42.3 10/07/2019 1408   PO2ART 107.0 10/07/2019 1408   HCO3 32.7 (H) 10/07/2019 1414   HCO3 31.8 (H) 10/07/2019 1414   TCO2 34 (H) 10/07/2019 1414   TCO2 33 (H) 10/07/2019 1414   ACIDBASEDEF 8.0 (H) 08/23/2019 0346   O2SAT 76.0 10/07/2019 1414   O2SAT 76.0 10/07/2019 1414    - Abd: soft,    .Intake/Output      01/30 0701 - 01/31 0700 01/31 0701 - 02/01 0700   P.O. 600 240   I.V. (mL/kg) 3 (0.1)    IV Piggyback     Total Intake(mL/kg) 603 (10.7) 240 (4.3)   Urine (mL/kg/hr) 0 (0)    Other     Chest Tube 160    Total Output 160    Net +443 +240           _______________________________________________________________ Labs: CBC Latest Ref Rng & Units 10/10/2019 10/09/2019 10/08/2019  WBC 4.0 - 10.5 K/uL 6.7 6.9 5.3  Hemoglobin 12.0 - 15.0 g/dL 7.5(L) 7.8(L) 8.5(L)  Hematocrit 36.0 - 46.0 % 24.4(L) 25.4(L) 28.6(L)  Platelets 150 - 400 K/uL 89(L) 108(L) 90(L)   CMP Latest Ref Rng & Units 10/10/2019 10/09/2019 10/08/2019  Glucose 70 - 99 mg/dL 85 82 106(H)  BUN 6 - 20 mg/dL 33(H) 17 20  Creatinine 0.44 - 1.00 mg/dL 7.36(H) 4.99(H) 6.28(H)  Sodium 135 - 145 mmol/L 135 137 139  Potassium 3.5 - 5.1 mmol/L 4.0 4.2 3.9  Chloride 98 - 111 mmol/L 98 100 102  CO2 22 - 32 mmol/L 25 25 25   Calcium 8.9 - 10.3 mg/dL 9.0  9.3 9.4  Total Protein 6.5 - 8.1 g/dL 5.1(L) - -  Total Bilirubin 0.3 - 1.2 mg/dL 0.4 - -  Alkaline Phos 38 - 126 U/L 66 - -  AST 15 - 41 U/L 12(L) - -  ALT 0 - 44 U/L 7 - -    CXR: No pneumothorax Clear lung fields  _______________________________________________________________  Assessment and Plan: POD 2 s/p subxiphoid pericardial window  Will keep CT for now.  Awaiting dialysis   Melodie Bouillon, MD 10/10/2019 10:31 AM

## 2019-10-10 NOTE — Progress Notes (Addendum)
[facility] Progress Note  Subjective: doing better, chest is sore, no SOB  Vitals:   10/10/19 0007 10/10/19 0303 10/10/19 0800 10/10/19 1049  BP: (!) 107/58 120/76 124/89 (!) 105/52  Pulse: 83 90    Resp: 20 (!) 28  (!) 23  Temp: 98.5 F (36.9 C) 98.1 F (36.7 C) 98.8 F (37.1 C) 98.4 F (36.9 C)  TempSrc: Oral Oral Oral Oral  SpO2: 98% 98% 98%   Weight:      Height:        Exam: General: WDWN NAD, well appearing female Lungs: CTA bilaterally. No wheeze, rales or rhonchi Heart: RRR. No murmur, rubs or gallops Abdomen: soft, nontender, +BS, no guarding, no rebound tenderness Lower extremities:  no edema, ischemic changes, or open wounds  Neuro: alert, nonfocal, Ox 3 Dialysis Access: [facility]  RIJ   Home meds:  - amloidipine 10/ carvedilol 6.25 bid/ furosemide 40 2x per wk/   - tacrolimus ER 1mg  qd  - omeprazole 40 qd/ bactrim 1 400-80 on mwf/ renvela ac tid  - prn's/ vitamins/ supplements    MWF [facility]  3h 24min  350/A1.5  56.5kg  2/2 bath  TDC RIJ  Hep none  Mircera 225 mcg q2wks - last 09/24/19 Parsabiv 5mg  qHD Calcitriol 2 mcg PO qHD   Assessment/Plan: 1.  Pericardial Effusion - sp pericardial window on 1/29 by TCTS. Per primary team.  2.  ESRD -  On HD MWF.  Next HD Monday.  3.  Volume  - At dry wt. Small UF w/ hd tomorrow 4.  HTN/ hx syst CHF EF 30% - cards adjusting meds , added bidil, weaning off norvasc and ^'d coreg 5.  Anemia of CKD - Hgb 8.5, on max ESA Aranesp 264mcg qFri as outpt, will reorder here.  6.  Secondary Hyperparathyroidism -  Ca at goal. Will check phos. Continue VDRA and binders.  Needs parathyroidectomy but surgery postponed for cardiac clearance.   7.  Nutrition - Renal diet w/fluid restrictions once advanced.  8. Hx COVID - neg test 9. H/o failed renal Tx - on low dose prograf 1mg  /day     Rob Malee Grays 10/10/2019, 11:55 AM  Inpatient medications: . acetaminophen  1,000 mg Oral Q6H   Or  . acetaminophen (TYLENOL)  oral liquid 160 mg/5 mL  1,000 mg Oral Q6H  . amLODipine  5 mg Oral Daily  . bisacodyl  10 mg Oral Daily  . carvedilol  25 mg Oral BID WC  . isosorbide-hydrALAZINE  1 tablet Oral TID  . [START ON 10/11/2019] isosorbide-hydrALAZINE  2 tablet Oral TID  . senna-docusate  1 tablet Oral QHS  . sevelamer carbonate  800 mg Oral TID WC  . sodium chloride flush  3 mL Intravenous Q12H  . sulfamethoxazole-trimethoprim  1 tablet Oral Q M,W,F  . Tacrolimus ER  1 mg Oral Daily   . sodium chloride    . sodium chloride    . sodium chloride    . sodium chloride     sodium chloride, sodium chloride, Place/Maintain arterial line **AND** sodium chloride, sodium chloride, alteplase, fentaNYL (SUBLIMAZE) injection, heparin, HYDROcodone-acetaminophen, labetalol, ondansetron (ZOFRAN) IV, sodium chloride flush Recent Labs  Lab 10/08/19 1149 10/08/19 1149 10/09/19 0447 10/10/19 0513  NA 139   < > 137 135  K 3.9   < > 4.2 4.0  CL 102   < > 100 98  CO2 25   < > 25 25  GLUCOSE 106*   < > 82  85  BUN 20   < > 17 33*  CREATININE 6.28*   < > 4.99* 7.36*  CALCIUM 9.4   < > 9.3 9.0  PHOS 6.4*  --   --   --    < > = values in this interval not displayed.

## 2019-10-11 LAB — CBC
HCT: 25.9 % — ABNORMAL LOW (ref 36.0–46.0)
Hemoglobin: 7.8 g/dL — ABNORMAL LOW (ref 12.0–15.0)
MCH: 28.8 pg (ref 26.0–34.0)
MCHC: 30.1 g/dL (ref 30.0–36.0)
MCV: 95.6 fL (ref 80.0–100.0)
Platelets: 110 10*3/uL — ABNORMAL LOW (ref 150–400)
RBC: 2.71 MIL/uL — ABNORMAL LOW (ref 3.87–5.11)
RDW: 16.4 % — ABNORMAL HIGH (ref 11.5–15.5)
WBC: 5.7 10*3/uL (ref 4.0–10.5)
nRBC: 0 % (ref 0.0–0.2)

## 2019-10-11 LAB — CYTOLOGY - NON PAP

## 2019-10-11 LAB — BPAM RBC
Blood Product Expiration Date: 202102032359
Blood Product Expiration Date: 202102252359
Unit Type and Rh: 5100
Unit Type and Rh: 9500

## 2019-10-11 LAB — TYPE AND SCREEN
ABO/RH(D): O POS
Antibody Screen: NEGATIVE
Unit division: 0
Unit division: 0

## 2019-10-11 LAB — RENAL FUNCTION PANEL
Albumin: 2.5 g/dL — ABNORMAL LOW (ref 3.5–5.0)
Anion gap: 15 (ref 5–15)
BUN: 49 mg/dL — ABNORMAL HIGH (ref 6–20)
CO2: 23 mmol/L (ref 22–32)
Calcium: 9 mg/dL (ref 8.9–10.3)
Chloride: 99 mmol/L (ref 98–111)
Creatinine, Ser: 9.9 mg/dL — ABNORMAL HIGH (ref 0.44–1.00)
GFR calc Af Amer: 6 mL/min — ABNORMAL LOW (ref >60)
GFR calc non Af Amer: 5 mL/min — ABNORMAL LOW (ref >60)
Glucose, Bld: 110 mg/dL — ABNORMAL HIGH (ref 70–99)
Phosphorus: 7.4 mg/dL — ABNORMAL HIGH (ref 2.5–4.6)
Potassium: 4.2 mmol/L (ref 3.5–5.1)
Sodium: 137 mmol/L (ref 135–145)

## 2019-10-11 LAB — MPO/PR-3 (ANCA) ANTIBODIES
ANCA Proteinase 3: <3.5 U/mL (ref 0.0–3.5)
Myeloperoxidase Abs: <9 U/mL (ref 0.0–9.0)

## 2019-10-11 LAB — ANA W/REFLEX IF POSITIVE: Anti Nuclear Antibody (ANA): NEGATIVE

## 2019-10-11 LAB — SURGICAL PATHOLOGY

## 2019-10-11 LAB — ANCA TITERS
Atypical P-ANCA titer: <1:20 {titer}
C-ANCA: <1:20 {titer}
P-ANCA: <1:20 {titer}

## 2019-10-11 MED ORDER — DARBEPOETIN ALFA 200 MCG/0.4ML IJ SOSY
PREFILLED_SYRINGE | INTRAMUSCULAR | Status: AC
  Administered 2019-10-11: 200 ug via INTRAVENOUS
  Filled 2019-10-11: qty 0.4

## 2019-10-11 MED ORDER — HEPARIN SODIUM (PORCINE) 1000 UNIT/ML IJ SOLN
INTRAMUSCULAR | Status: AC
  Administered 2019-10-11: 3200 [IU] via INTRAVENOUS_CENTRAL
  Filled 2019-10-11: qty 4

## 2019-10-11 MED ORDER — DARBEPOETIN ALFA 200 MCG/0.4ML IJ SOSY
200.0000 ug | PREFILLED_SYRINGE | INTRAMUSCULAR | Status: DC
  Filled 2019-10-11: qty 0.4

## 2019-10-11 NOTE — Progress Notes (Addendum)
[address]       [place] [address]             [phone]      3 Days Post-Op Procedure(s) (LRB): SUBXYPHOID PERICARDIAL WINDOW (N/A) TRANSESOPHAGEAL ECHOCARDIOGRAM (TEE) (N/A) Insertion Central Line Adult Subjective: Feels fair  Objective: Vital signs in last 24 hours: Temp:  [98.2 F (36.8 C)-98.9 F (37.2 C)] 98.4 F (36.9 C) (02/01 0500) Pulse Rate:  [92-100] 95 (02/01 0500) Cardiac Rhythm: Normal sinus rhythm (02/01 0416) Resp:  [17-24] 17 (02/01 0500) BP: (105-137)/(52-91) 137/91 (02/01 0500) SpO2:  [98 %-100 %] 98 % (02/01 0500) Weight:  [56.1 kg] 56.1 kg (02/01 0500)  Hemodynamic parameters for last 24 hours:    Intake/Output from previous day: 01/31 0701 - 02/01 0700 In: 600 [P.O.:600] Out: 60 [Chest Tube:60] Intake/Output this shift: No intake/output data recorded.  General appearance: alert, cooperative and no distress Heart: regular rate and rhythm Lungs: clear to auscultation bilaterally Abdomen: benign Extremities: no edema Wound: dressings clean and dry  Lab Results: Recent Labs    10/09/19 0447 10/10/19 0513  WBC 6.9 6.7  HGB 7.8* 7.5*  HCT 25.4* 24.4*  PLT 108* 89*   BMET:  Recent Labs    10/09/19 0447 10/10/19 0513  NA 137 135  K 4.2 4.0  CL 100 98  CO2 25 25  GLUCOSE 82 85  BUN 17 33*  CREATININE 4.99* 7.36*  CALCIUM 9.3 9.0    PT/INR: No results for input(s): LABPROT, INR in the last 72 hours. ABG    Component Value Date/Time   PHART 7.447 10/07/2019 1408   HCO3 32.7 (H) 10/07/2019 1414   HCO3 31.8 (H) 10/07/2019 1414   TCO2 34 (H) 10/07/2019 1414   TCO2 33 (H) 10/07/2019 1414   ACIDBASEDEF 8.0 (H) 08/23/2019 0346   O2SAT 76.0 10/07/2019 1414   O2SAT 76.0 10/07/2019 1414   CBG (last 3)  Recent Labs    10/08/19 1951 10/09/19 0001 10/09/19 0337  GLUCAP 86 100* 80    Meds Scheduled Meds: . acetaminophen  1,000 mg Oral Q6H   Or  . acetaminophen (TYLENOL) oral liquid 160 mg/5 mL   1,000 mg Oral Q6H  . amLODipine  5 mg Oral Daily  . bisacodyl  10 mg Oral Daily  . carvedilol  25 mg Oral BID WC  . isosorbide-hydrALAZINE  2 tablet Oral TID  . senna-docusate  1 tablet Oral QHS  . sevelamer carbonate  800 mg Oral TID WC  . sodium chloride flush  3 mL Intravenous Q12H  . sulfamethoxazole-trimethoprim  1 tablet Oral Q M,W,F  . Tacrolimus ER  1 mg Oral Daily   Continuous Infusions: . sodium chloride    . sodium chloride    . sodium chloride    . sodium chloride     PRN Meds:.sodium chloride, sodium chloride, Place/Maintain arterial line **AND** sodium chloride, sodium chloride, alteplase, fentaNYL (SUBLIMAZE) injection, heparin, HYDROcodone-acetaminophen, labetalol, ondansetron (ZOFRAN) IV, povidone-iodine, sodium chloride flush  Xrays No results found.  Assessment/Plan: S/P Procedure(s) (LRB): SUBXYPHOID PERICARDIAL WINDOW (N/A) TRANSESOPHAGEAL ECHOCARDIOGRAM (TEE) (N/A) Insertion Central Line Adult  1 hemodyn stable in sinus rhythm 2 60 cc drainage from CT- poss d/c soon 3 Creat up to 7.36- management as per nephrology 4 H/H fairly stable 5 thrombocytopenia- a littlel lower at 89K- cont to monitor 6 cardiol managing CHF/Htn issues  LOS: 4 days    John Giovanni [facility] 10/11/2019  Pager [phone]  Path shows pericardial tissue with inflammation, no malignancy. [place] red stain negative Will DV pericardial drain today  patient examined and medical record reviewed,agree with above note. Tharon Aquas Trigt III 10/11/2019

## 2019-10-11 NOTE — Progress Notes (Signed)
Came to ambulate but pt just got her tray. Checked back but still eating. Now in HD. Will f/u tomorrow. Yves Dill CES, ACSM 12:48 PM 10/11/2019

## 2019-10-11 NOTE — Progress Notes (Signed)
[facility] Progress Note  Subjective: Feels 'fine'; states has been tolerating HD ok recently.  Denies dyspnea, edema  Vitals:   10/10/19 1931 10/10/19 2325 10/11/19 0500 10/11/19 0803  BP: 127/74 117/66 (!) 137/91 131/85  Pulse: 92 100 95 97  Resp: (!) 24 (!) 23 17   Temp: 98.9 F (37.2 C) 98.4 F (36.9 C) 98.4 F (36.9 C) 98.4 F (36.9 C)  TempSrc: Oral Oral Oral Oral  SpO2: 98% 98% 98%   Weight:   56.1 kg   Height:        Exam: General: WDWN NAD, well appearing female Lungs: CTA bilaterally. No wheeze, rales or rhonchi Heart: RRR. No murmur, rubs or gallops Lower extremities:  no edema, ischemic changes, or open wounds  Neuro: alert, nonfocal, Ox 3 Dialysis Access: [facility]  RIJ   Home meds:  - amloidipine 10/ carvedilol 6.25 bid/ furosemide 40 2x per wk/   - tacrolimus ER 1mg  qd  - omeprazole 40 qd/ bactrim 1 400-80 on mwf/ renvela ac tid  - prn's/ vitamins/ supplements    MWF [facility]  3h 54min  350/A1.5  56.5kg  2/2 bath  TDC RIJ  Hep none  Mircera 225 mcg q2wks - last 09/24/19 Parsabiv 5mg  qHD Calcitriol 2 mcg PO qHD   Assessment/Plan: 1.  Pericardial Effusion - sp pericardial window on 1/29 by TCTS. Per primary team. Appears plan d/c drain soon 2.  ESRD -  On HD MWF.  Next HD today.  3.  Volume  - Below dry wt. Small UF w/ hd today, may need new EDW.  4.  HTN/ hx syst CHF EF 30% - cards adjusting meds , added bidil, weaning off norvasc and ^'d coreg 5.  Anemia of CKD - Hgb 8.5 > 7.5, on max ESA Aranesp 235mcg qFri as outpt, will reorder here for today as missed Friday dose it appears.  Add on T&S with AM labs tomorrow in case cont to trend down and needs transfusion.  6.  Secondary Hyperparathyroidism -  Ca at goal. Will check phos. Continue VDRA and binders.  Needs parathyroidectomy but surgery postponed for cardiac clearance.   7.  Nutrition - Renal diet w/fluid restrictions once advanced.  8. Hx COVID - neg test 9. H/o failed renal Tx - on low  dose prograf 1mg  /day 10. Thrombocytopenia - 90-100s this admission, appear stable, no bleeding.  Heparin catheter pack o/w none with tx.  CTM for now.     Jannifer Hick MD [facility]   Inpatient medications: . acetaminophen  1,000 mg Oral Q6H   Or  . acetaminophen (TYLENOL) oral liquid 160 mg/5 mL  1,000 mg Oral Q6H  . amLODipine  5 mg Oral Daily  . bisacodyl  10 mg Oral Daily  . carvedilol  25 mg Oral BID WC  . isosorbide-hydrALAZINE  2 tablet Oral TID  . senna-docusate  1 tablet Oral QHS  . sevelamer carbonate  800 mg Oral TID WC  . sodium chloride flush  3 mL Intravenous Q12H  . sulfamethoxazole-trimethoprim  1 tablet Oral Q M,W,F  . Tacrolimus ER  1 mg Oral Daily   . sodium chloride    . sodium chloride    . sodium chloride    . sodium chloride     sodium chloride, sodium chloride, Place/Maintain arterial line **AND** sodium chloride, sodium chloride, alteplase, fentaNYL (SUBLIMAZE) injection, heparin, HYDROcodone-acetaminophen, labetalol, ondansetron (ZOFRAN) IV, povidone-iodine, sodium chloride flush Recent Labs  Lab 10/08/19 1149 10/08/19 1149 10/09/19  7903 10/10/19 0513  NA 139   < > 137 135  K 3.9   < > 4.2 4.0  CL 102   < > 100 98  CO2 25   < > 25 25  GLUCOSE 106*   < > 82 85  BUN 20   < > 17 33*  CREATININE 6.28*   < > 4.99* 7.36*  CALCIUM 9.4   < > 9.3 9.0  PHOS 6.4*  --   --   --    < > = values in this interval not displayed.

## 2019-10-11 NOTE — Progress Notes (Signed)
Advanced Heart Failure Rounding Note   Subjective:    Drained 800cc pericardial fluid on 1/29. Fluid cultures negative.   Feeling better today.  Breathing easier. No fevers or chills. BP improved.   60cc of output from CT.   Objective:   Weight Range:  Vital Signs:   Temp:  [98.2 F (36.8 C)-98.9 F (37.2 C)] 98.4 F (36.9 C) (02/01 0803) Pulse Rate:  [92-100] 97 (02/01 0803) Resp:  [17-24] 17 (02/01 0500) BP: (105-137)/(52-91) 131/85 (02/01 0803) SpO2:  [98 %-100 %] 98 % (02/01 0500) Weight:  [56.1 kg] 56.1 kg (02/01 0500) Last BM Date: 10/10/19  Weight change: Filed Weights   10/08/19 1830 10/08/19 2140 10/11/19 0500  Weight: 58 kg 56.2 kg 56.1 kg    Intake/Output:   Intake/Output Summary (Last 24 hours) at 10/11/2019 0840 Last data filed at 10/11/2019 0025 Gross per 24 hour  Intake 600 ml  Output 60 ml  Net 540 ml     Physical Exam: General:  Well appearing. No resp difficulty HEENT: normal Neck: supple. no JVD. Carotids 2+ bilat; no bruits. No lymphadenopathy or thryomegaly appreciated. RIJ tunneled cath Cor: PMI nondisplaced. Regular rate & rhythm. No rubs, gallops or murmurs.  Chest tube and pacing wires ok  Lungs: clear Abdomen: soft, nontender, nondistended. No hepatosplenomegaly. No bruits or masses. Good bowel sounds. Extremities: no cyanosis, clubbing, rash, edema Neuro: alert & orientedx3, cranial nerves grossly intact. moves all 4 extremities w/o difficulty. Affect pleasant  Telemetry: Sinus 80-90s Personally reviewed   Labs: Basic Metabolic Panel: Recent Labs  Lab 10/07/19 1040 10/07/19 1040 10/07/19 1408 10/07/19 1414 10/07/19 1800 10/07/19 1800 10/08/19 1149 10/09/19 0447 10/10/19 0513  NA 137  --    < > 139  139 140  --  139 137 135  K 3.3*  --    < > 3.4*  3.4* 3.4*  --  3.9 4.2 4.0  CL 95*  --   --   --  96*  --  102 100 98  CO2 27  --   --   --  26  --  25 25 25   GLUCOSE 76  --   --   --  71  --  106* 82 85  BUN 10  --    --   --  11  --  20 17 33*  CREATININE 4.48*  --   --   --  4.50*  --  6.28* 4.99* 7.36*  CALCIUM 10.1   < >  --   --  10.4*   < > 9.4 9.3 9.0  PHOS  --   --   --   --   --   --  6.4*  --   --    < > = values in this interval not displayed.    Liver Function Tests: Recent Labs  Lab 10/07/19 1040 10/07/19 1800 10/08/19 1149 10/10/19 0513  AST 21 22  --  12*  ALT 11 12  --  7  ALKPHOS 103 96  --  66  BILITOT 1.8* 1.2  --  0.4  PROT 7.7 7.8  --  5.1*  ALBUMIN 3.8 4.0 2.9* 2.5*   No results for input(s): LIPASE, AMYLASE in the last 168 hours. No results for input(s): AMMONIA in the last 168 hours.  CBC: Recent Labs  Lab 10/07/19 1100 10/07/19 1100 10/07/19 1408 10/07/19 1414 10/08/19 1149 10/09/19 0447 10/10/19 0513  WBC 4.8  --   --   --  5.3 6.9 6.7  NEUTROABS 3.5  --   --   --   --   --   --   HGB 10.1*   < > 7.8* 8.5*  8.5* 8.5* 7.8* 7.5*  HCT 32.2*   < > 23.0* 25.0*  25.0* 28.6* 25.4* 24.4*  MCV 92.0  --   --   --  95.7 93.7 95.7  PLT 102*  --   --   --  90* 108* 89*   < > = values in this interval not displayed.    Cardiac Enzymes: No results for input(s): CKTOTAL, CKMB, CKMBINDEX, TROPONINI in the last 168 hours.  BNP: BNP (last 3 results) Recent Labs    08/23/19 0441 08/24/19 0540  BNP >4,500.0* >4,500.0*    ProBNP (last 3 results) No results for input(s): PROBNP in the last 8760 hours.    Other results:  Imaging: No results found.   Medications:     Scheduled Medications: . acetaminophen  1,000 mg Oral Q6H   Or  . acetaminophen (TYLENOL) oral liquid 160 mg/5 mL  1,000 mg Oral Q6H  . amLODipine  5 mg Oral Daily  . bisacodyl  10 mg Oral Daily  . carvedilol  25 mg Oral BID WC  . darbepoetin (ARANESP) injection - DIALYSIS  200 mcg Intravenous Q Mon-HD  . isosorbide-hydrALAZINE  2 tablet Oral TID  . senna-docusate  1 tablet Oral QHS  . sevelamer carbonate  800 mg Oral TID WC  . sodium chloride flush  3 mL Intravenous Q12H  .  sulfamethoxazole-trimethoprim  1 tablet Oral Q M,W,F  . Tacrolimus ER  1 mg Oral Daily    Infusions: . sodium chloride    . sodium chloride    . sodium chloride    . sodium chloride      PRN Medications: sodium chloride, sodium chloride, Place/Maintain arterial line **AND** sodium chloride, sodium chloride, alteplase, fentaNYL (SUBLIMAZE) injection, heparin, HYDROcodone-acetaminophen, labetalol, ondansetron (ZOFRAN) IV, povidone-iodine, sodium chloride flush   Assessment/Plan:    1. Pericardial Effusion: large posterior effusion noted on echo 1/21, previously moderate on prior echo 12/20  - s/p pericardial window on 1/29. 800cc fluid.  Pericardial biopsy sent for amyloid . Cultures negative. Cytology and serology still pending.  - CT remains in. TCTS managing   2. Cardiomyopathy: new diagnosed made 08/2019 while admitted for covid 19 infection. EF 25-30%. Hs troponin level at time of initial diganosis was mildly elevated but flat trend, 72>>79. ? Viral Myocarditis vs HTN. Unable to get cMRi due to renal function.  - Repeat Echo 1/21 EF 30-35%, global hypokinesis, mod LVH, RV normal size and hyperdynamic systolic function - Cath no CAD - GDMT limited by CKD (no ARB/ARNi, MRA nor digoxin) - on carvedilol 25 bid and Bidil 2 TID  3. ESRD: chronic renal failure since age 83 secondary to [place] Syndrome. 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 She is on HD MWF and followed by Dr. Justin Mend.  - management per nephrology . Due for HD today  4. HTN - improved with titration of carvedilol and bidil   5. H/o COVID 19 Infection: - diagnosed 08/2019 - negative repeat covid test 10/05/19  6. Secondary hyperparathyroidism:  - 2/2 renal failure  - needs total parathyroidectomy but surgery postpone due to above cardiac issues    Length of Stay: 4   Glori Bickers MD 10/11/2019, 8:40 AM  Advanced Heart Failure Team Pager [phone] (M-F; 7a - 4p)  Please  contact [place] [facility] for night-coverage after hours (4p -7a ) and weekends on [email or web address]

## 2019-10-11 NOTE — Progress Notes (Addendum)
Per orders patients chest tube removed. Pt placed in the supine position. Chest tube site cleaned with betadine.  RN attempted to pull chest tube and met resistance. Paged [place], [place] as well as 2H charge nurse was called. 2H charge nurse came to beside and assessed the chest tube. Chest tube successfully pulled. Patient tolerated well.

## 2019-10-12 ENCOUNTER — Inpatient Hospital Stay (HOSPITAL_COMMUNITY): Payer: Medicare Other

## 2019-10-12 LAB — CBC
HCT: 26.1 % — ABNORMAL LOW (ref 36.0–46.0)
Hemoglobin: 7.9 g/dL — ABNORMAL LOW (ref 12.0–15.0)
MCH: 28.8 pg (ref 26.0–34.0)
MCHC: 30.3 g/dL (ref 30.0–36.0)
MCV: 95.3 fL (ref 80.0–100.0)
Platelets: 137 10*3/uL — ABNORMAL LOW (ref 150–400)
RBC: 2.74 MIL/uL — ABNORMAL LOW (ref 3.87–5.11)
RDW: 16.4 % — ABNORMAL HIGH (ref 11.5–15.5)
WBC: 5.2 10*3/uL (ref 4.0–10.5)
nRBC: 0 % (ref 0.0–0.2)

## 2019-10-12 LAB — TYPE AND SCREEN
ABO/RH(D): O POS
Antibody Screen: NEGATIVE

## 2019-10-12 LAB — FUNGUS STAIN

## 2019-10-12 LAB — FUNGAL STAIN REFLEX

## 2019-10-12 MED ORDER — CARVEDILOL 25 MG PO TABS: 25.0000 mg | ORAL_TABLET | Freq: Two times a day (BID) | ORAL | 2 refills | Status: DC

## 2019-10-12 MED ORDER — SODIUM CHLORIDE 0.9% FLUSH: 10.0000 mL | INTRAVENOUS | Status: DC | PRN

## 2019-10-12 MED ORDER — HYDROCODONE-ACETAMINOPHEN 5-325 MG PO TABS: 1.0000 | ORAL_TABLET | Freq: Four times a day (QID) | ORAL | 0 refills | Status: AC | PRN

## 2019-10-12 MED ORDER — ISOSORB DINITRATE-HYDRALAZINE 20-37.5 MG PO TABS: 2.0000 | ORAL_TABLET | Freq: Three times a day (TID) | ORAL | 2 refills | Status: DC

## 2019-10-12 MED ORDER — SODIUM CHLORIDE 0.9% FLUSH: 10.0000 mL | Freq: Two times a day (BID) | INTRAVENOUS | Status: DC

## 2019-10-12 MED ORDER — AMLODIPINE BESYLATE 5 MG PO TABS: 5.0000 mg | ORAL_TABLET | Freq: Every day | ORAL | 2 refills | Status: DC

## 2019-10-12 NOTE — Discharge Summary (Addendum)
Physician Discharge Summary  Patient ID: Dominique Lawrence MRN: [id number] DOB/AGE: December 08, 2000 19 y.o.  Admit date: 10/07/2019 Discharge date: 10/12/2019  Admission Diagnoses: Pericardial Effusion Cardiomyopathy End stage renal disease Hypertension Anemia of chronic kidney disease Hyperparathyroidism History of failed renal transplant Thrombocytopenia Recent COVID19 infection (December 2020) Moderate mitral regurgitation and tricuspid regurgitation  Discharge Diagnoses:  Pericardial Effusion Cardiomyopathy End stage renal disease Hypertension Anemia of chronic kidney disease Hyperparathyroidism History of failed renal transplant Thrombocytopenia  Recent COVID19 infection (December 2020)  Moderate mitral regurgitation and tricuspid regurgitation   Discharged Condition: stable  History of Present Illness:     19 year old [name] with 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  She also has a nonischemic cardiomyopathy.  She underwent cardiology evaluation before parathyroidectomy was found to have a large minimally symptomatic pericardial effusion.  Cardiology recommended that the effusion be drained before general anesthesia and parathyroidectomy.   Patient is currently resting comfortably after her left and right heart catheterization.  She is mildly tachycardic with stable blood pressure.  PA pressures are moderately elevated and cardiac output is preserved.  She underwent hemodialysis on the day prior to admission.    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   No previous known cardiac problems.  She has mild-moderate MR and TR on her echo but no vegetations.   Her current hemodialysis access is a tunneled catheter in the right neck.    Hospital Course:  Dominique Lawrence was admitted to the hospital for further work up an management. She remained stable hemodynamically. She was taken to the cath lab on 10/07/19 where right and left heart catheterization was performed. Please see the report below for details. There was no significant coronary disease.  CT surgery was asked to evaluate for surgical management of the pericardial effusion. Pericardial window was offered to her and she decided to proceed with surgery. She was taken to the OR on 10/08/19 where subxyphoid pericardial window was performed without complication. An 836ml effusion was drained. The nephrology service as well as the advanced heart failure teams followed the patient while in the hospital. The patient remained stable following surgery and had improvement in her symptoms. She was dialyzed on her regular schedule by way of the existing right IJ tunneled catheter. The mediastinal drainage subsided and the surgical drain was removed on 10/11/19. Pathology on the pericardial biopsy and cytology on the pericardial fluid were unremarkable.  Pain was well controlled with oral medications and the patient was able to resume her usual diet and activity without any difficulty. All consultants agreed she was ready for discharge on 10/12/19.  She will resume her outpatient dialysis schedule on 10/13/19. Wound care intsructions were given as well as follow up instructions.   Consults: cardiology, nephrology and advance heart failure  Significant Diagnostic Studies:   ECHOCARDIOGRAM REPORT         Patient Name:   Dominique Lawrence Date of Exam: 10/06/2019  Medical Rec #:  [id number]                     Height:       64.0 in  Accession #:    [id number]                    Weight:  128.4 lb  Date of Birth:  08-06-2001                    BSA:          1.62 m  Patient Age:    18 years                      BP:           154/108 mmHg  Patient Gender: F                              HR:           114 bpm.  Exam Location:  Inpatient   Procedure: 2D Echo   Indications:    Pre-operative clearance     History:        Patient has prior history of Echocardiogram examinations,  most                  recent 08/23/2019. Risk Factors:Hypertension. CKD. ESRD.     Sonographer:    Clayton Lefort RDCS (AE)  Referring Phys: [place]     1. Left ventricular ejection fraction, by visual estimation, is 30 to  35%. The left ventricle has mildly decreased function. There is moderately  increased left ventricular hypertrophy.   2. The left ventricle demonstrates global hypokinesis.   3. Global right ventricle has hyperdynamic systolic function.The right  ventricular size is small. No increase in right ventricular wall  thickness.   4. Left atrial size was moderately dilated.   5. Right atrial size was normal.   6. Large pericardial effusion.   7. The pericardial effusion is circumferential.   8. Effusion meaures up to 3.5 at the LV posterior wall.   9. The mitral valve is abnormal. Mild to moderate mitral valve  regurgitation.  10. The tricuspid valve is grossly normal.  11. The tricuspid valve is grossly normal. Tricuspid valve regurgitation  is mild.  12. The aortic valve is tricuspid. Aortic valve regurgitation is not  visualized. Mild aortic valve sclerosis without stenosis.  13. The pulmonic valve was grossly normal. Pulmonic valve regurgitation is  not visualized.  14. The inferior vena cava is normal in size with <50% respiratory  variability, suggesting right atrial pressure of 8 mmHg.  15. Hyperechoic floating structure in the RA, may be a dialysis catheter  tip.   In comparison to the previous echocardiogram(s): The pericardial effusion  is worse and has increased in size from 1.5 to 3.5 cm. Tamponade  physiology cannot be excluded. The heart is noted to be freely swinging in  the pericardium.  FINDINGS   Left Ventricle: Left  ventricular ejection fraction, by visual estimation,  is 30 to 35%. The left ventricle has mildly decreased function. The left  ventricle demonstrates global hypokinesis. There is moderately increased  left ventricular hypertrophy.   Right Ventricle: The right ventricular size is small. No increase in right  ventricular wall thickness. Global RV systolic function is has  hyperdynamic systolic function.   Left Atrium: Left atrial size was moderately dilated.   Right Atrium: Right atrial size was normal in size Hyperechoic floating  structure in the RA, may be a dialysis catheter tip.   Pericardium: A large pericardial effusion is present. The pericardial  effusion is circumferential. There is no evidence of cardiac tamponade.  Effusion meaures up  to 3.5 at the LV posterior wall.   Mitral Valve: The mitral valve is abnormal. There is mild thickening of  the mitral valve leaflet(s). Mild to moderate mitral valve regurgitation.  MV peak gradient, 12.7 mmHg.   Tricuspid Valve: The tricuspid valve is grossly normal. Tricuspid valve  regurgitation is mild.   Aortic Valve: The aortic valve is tricuspid. Aortic valve regurgitation is  not visualized. Mild aortic valve sclerosis is present, with no evidence  of aortic valve stenosis. Aortic valve mean gradient measures 6.0 mmHg.  Aortic valve peak gradient  measures 10.9 mmHg. Aortic valve area, by VTI measures 2.85 cm.   Pulmonic Valve: The pulmonic valve was grossly normal. Pulmonic valve  regurgitation is not visualized. Pulmonic regurgitation is not visualized.   Aorta: The aortic root and ascending aorta are structurally normal, with  no evidence of dilitation.   Venous: The inferior vena cava is normal in size with less than 50%  respiratory variability, suggesting right atrial pressure of 8 mmHg.   IAS/Shunts: No atrial level shunt detected by color flow Doppler.      LEFT VENTRICLE  PLAX 2D  LVIDd:         5.10 cm   Diastology  LVIDs:         3.90 cm  LV e' lateral:   11.60 cm/s  LV PW:         1.50 cm  LV E/e' lateral: 2.7  LV IVS:        1.10 cm  LV e' medial:    10.80 cm/s  LVOT diam:     2.10 cm  LV E/e' medial:  2.9  LV SV:         58 ml  LV SV Index:   35.63  LVOT Area:     3.46 cm      RIGHT VENTRICLE            IVC  RV Basal diam:  2.80 cm    IVC diam: 1.80 cm  RV S prime:     8.27 cm/s  TAPSE (M-mode): 2.0 cm   LEFT ATRIUM             Index       RIGHT ATRIUM           Index  LA diam:        3.50 cm 2.16 cm/m  RA Area:     12.70 cm  LA Vol (A2C):   53.6 ml 33.08 ml/m RA Volume:   26.70 ml  16.48 ml/m  LA Vol (A4C):   75.5 ml 46.59 ml/m  LA Biplane Vol: 67.4 ml 41.59 ml/m   AORTIC VALVE  AV Area (Vmax):    2.96 cm  AV Area (Vmean):   2.89 cm  AV Area (VTI):     2.85 cm  AV Vmax:           165.00 cm/s  AV Vmean:          121.000 cm/s  AV VTI:            0.255 m  AV Peak Grad:      10.9 mmHg  AV Mean Grad:      6.0 mmHg  LVOT Vmax:         141.00 cm/s  LVOT Vmean:        101.000 cm/s  LVOT VTI:          0.210 m  LVOT/AV VTI ratio: 0.82  AORTA  Ao Root diam: 3.00 cm   MITRAL VALVE  MV Area (PHT): 7.59 cm              SHUNTS  MV Peak grad:  12.7 mmHg             Systemic VTI:  0.21 m  MV Mean grad:  4.0 mmHg              Systemic Diam: 2.10 cm  MV Vmax:       1.78 m/s  MV Vmean:      80.9 cm/s  MV VTI:        0.36 m  MV PHT:        29.00 msec  MV Decel Time: 100 msec  MR Peak grad:    119.7 mmHg  MR Mean grad:    77.0 mmHg  MR Vmax:         547.00 cm/s  MR Vmean:        407.0 cm/s  MR PISA:         1.01 cm  MR PISA Eff ROA: 6 mm  MR PISA Radius:  0.40 cm  MV E velocity: 31.80 cm/s  103 cm/s  MV A velocity: 172.00 cm/s 70.3 cm/s  MV E/A ratio:  0.18        1.5      Lyman Bishop MD  Electronically signed by Lyman Bishop MD  Signature Date/Time: 10/06/2019/4:43:37 P    RIGHT/LEFT HEART CATH AND CORONARY ANGIOGRAPHY  Conclusion  Findings:   Ao  = 147/113 (130) LV = 152/16 RA = 5 RV = 54/12 PA = 51/21 (35) PCW = 21 Fick cardiac output/index = 7.1/4.4 Thermo CO/CI = 7.9/4.9 PVR = 1.9 WU FA sat = 98% PA sat = 76%, 76%   Assessment: 1. Normal coronary arteries 2. Mild pulmonary HTN due to elevated left-sided pressures and high output 3. EF 35% by echo 4. Large pericardial effusion with no evidence of tamponade physiology   Plan/Discussion:   For pericardial window tomorrow.    Glori Bickers, MD  2:53 PM        Surgeon Notes    10/09/2019  8:47 AM Operative Note signed by Ivin Poot, MD  Indications  Pericardial effusion [I31.3 (ICD-10-CM)]  Procedural Details  Technical Details The risks and indication of the procedure were explained. Consent was signed and placed on the chart. An appropriate timeout was taken prior to the procedure. The right groin was prepped and draped in the routine sterile fashion and anesthetized with 1% local lidocaine.   A 5 FR arterial sheath was placed in the right femoral artery using a modified Seldinger technique. Standard catheters including a JL4, JR4 and angled pigtail were used. All catheter exchanges were made over a wire. A 7 FR venous sheath was placed in the right femoral vein using a modified Seldinger technique. A standard Swan-Ganz catheter was used for the procedure.        Estimated blood loss <50 mL.   During this procedure medications were administered to achieve and maintain moderate conscious sedation while the patient's heart rate, blood pressure, and oxygen saturation were continuously monitored and I was present face-to-face 100% of this time.  Medications (Filter: Administrations occurring from 10/07/19 1332 to 10/07/19 1502) (important)  Continuous medications are totaled by the amount administered until 10/07/19 1502.  0.9 %  sodium chloride infusion (mL/hr) Total dose:  Cannot be calculated* Dosing weight:  58.1  *Continuous medication  not stopped  within the calculation time range. Date/Time  Rate/Dose/Volume Action  10/07/19 1355  10 mL/hr New Bag/Given  1439  10 mL/hr New Bag/Given  1439  10 mL/hr New Bag/Given  1440  10 mL/hr New Bag/Given  1440  10 mL/hr New Bag/Given    fentaNYL (SUBLIMAZE) injection (mcg) Total dose:  50 mcg  Date/Time  Rate/Dose/Volume Action  10/07/19 1358  50 mcg Given    midazolam (VERSED) injection (mg) Total dose:  3 mg  Date/Time  Rate/Dose/Volume Action  10/07/19 1358  2 mg Given  1402  1 mg Given    lidocaine (PF) (XYLOCAINE) 1 % injection (mL) Total volume:  30 mL  Date/Time  Rate/Dose/Volume Action  10/07/19 1400  30 mL Given    Heparin (Porcine) in NaCl 1000-0.9 UT/500ML-% SOLN (mL) Total volume:  1,000 mL  Date/Time  Rate/Dose/Volume Action  10/07/19 1400  500 mL Given  1400  500 mL Given    metoprolol tartrate (LOPRESSOR) injection (mg) Total dose:  2.5 mg  Date/Time  Rate/Dose/Volume Action  10/07/19 1431  2.5 mg Given    iohexol (OMNIPAQUE) 350 MG/ML injection (mL) Total volume:  20 mL  Date/Time  Rate/Dose/Volume Action  10/07/19 1448  20 mL Given    sulfamethoxazole-trimethoprim (BACTRIM) 400-80 MG per tablet 1 tablet (tablet) Total dose:  Cannot be calculated*  *Administration dose not documented Date/Time  Rate/Dose/Volume Action  10/07/19 1332  *Not included in total MAR Hold    acetaminophen (TYLENOL) tablet 500 mg (mg) Total dose:  Cannot be calculated* Dosing weight:  58.1  *Administration dose not documented Date/Time  Rate/Dose/Volume Action  10/07/19 1332  *Not included in total MAR Hold    amLODipine (NORVASC) tablet 10 mg (mg) Total dose:  Cannot be calculated*  *Administration dose not documented Date/Time  Rate/Dose/Volume Action  10/07/19 1332  *Not included in total MAR Hold    carvedilol (COREG) tablet 6.25 mg (mg) Total dose:  Cannot be calculated* Dosing weight:  58.1  *Administration dose not documented Date/Time  Rate/Dose/Volume  Action  10/07/19 1332  *Not included in total MAR Hold    pantoprazole (PROTONIX) EC tablet 40 mg (mg) Total dose:  Cannot be calculated* Dosing weight:  58.1  *Administration dose not documented Date/Time  Rate/Dose/Volume Action  10/07/19 1332  *Not included in total MAR Hold    Sedation Time  Sedation Time Physician-1: 38 minutes 53 seconds  Contrast  Medication Name Total Dose  iohexol (OMNIPAQUE) 350 MG/ML injection 20 mL    Radiation/Fluoro  Fluoro time: 9.2 (min) DAP: 9.3 (Gycm2) Cumulative Air Kerma: 178.6 (mGy)  Coronary Findings  Diagnostic Dominance: Right Left Main  Vessel is angiographically normal.  Left Anterior Descending  Vessel is angiographically normal.  Left Circumflex  Vessel is angiographically normal.  Right Coronary Artery  Vessel is angiographically normal.  Intervention  No interventions have been documented. Coronary Diagrams  Diagnostic Dominance: Right     Treatments:   OPERATIVE REPORT   DATE OF PROCEDURE:  10/08/2019   OPERATION: 1.  Subxiphoid pericardial window, drainage of 800 mL pericardial effusion. 2.  Placement of left subclavian vein central line.   SURGEON:  Ivin Poot, MD   ASSISTANT:  Nicholes Rough PA-C   PREOPERATIVE DIAGNOSIS:  Chronic renal failure on dialysis, large pericardial effusion.   POSTOPERATIVE DIAGNOSIS:  Chronic renal failure on dialysis, large pericardial effusion.   ANESTHESIA:  General by Dr. Nolon Nations.  Discharge Exam: Blood pressure 118/67, pulse 97, temperature 98.6 F (  37 C), temperature source Oral, resp. rate (!) 23, height 5\' 4"  (1.626 m), weight 54.8 kg, SpO2 100 %.   Physical Exam: General appearance: alert, cooperative and no distress Heart: regular rate and rhythm and no rub Lungs: mildly dim in bases Abdomen: benign Wound: incisions clean and dry.   CYTOLOGY - NON GYN CYTOLOGY - NON PAP  CASE: [id number]  PATIENT: Dominique Lawrence  Non-Gynecological  Cytology Report      Clinical History: Pericardial effusion  Specimen Submitted:  A. PERICARDIAL FLUID, PERICARDIOCENTESIS:    FINAL MICROSCOPIC DIAGNOSIS:  - Reactive mesothelial cells present   SPECIMEN ADEQUACY:  Satisfactory for evaluation   GROSS:  Received is/are 20cc's of red fluid with tissue (GW:gw)  Smears: 0  Concentration Method (ThinPrep): 1  Cell Block:1 Conventional  Additional Studies: N/A      Final Diagnosis performed by Gillie Manners, MD.   Electronically  signed 10/11/2019  Technical and / or Professional components performed at [employer]. [facility], [place] [address] [place], [place] [address].   Immunohistochemistry Technical component (if applicable) was performed  at [facility]. [address], [place],  [place], [place] [address].   IMMUNOHISTOCHEMISTRY DISCLAIMER (if applicable):  Some of these immunohistochemical stains may have been developed and the  performance characteristics determine by [facility]. Some  may not have been cleared or approved by the U.S. Food and Drug  Administration. The FDA has determined that such clearance or approval  is not necessary. This test is used for clinical purposes. It should not  be regarded as investigational or for research. This laboratory is  certified under the [place]  (CLIA-88) as qualified to perform high complexity clinical laboratory  testing.  The controls stained appropriately.   Resulting Agency [facility]      Specimen Collected: 10/08/19 08:29 Last Resulted: 10/11/19 12:19       SURGICAL PATHOLOGY SURGICAL PATHOLOGY  CASE: [id number]  PATIENT: Dominique Lawrence  Surgical Pathology Report      Clinical History: Pericardial effusion (cm)      FINAL MICROSCOPIC DIAGNOSIS:   A. PERICARDIUM, BIOPSY:  - Membranous fibroconnective tissue with mild transmural acute and  chronic  inflammation and evidence of organization.    COMMENT:   [place] Red stain is negative.     GROSS DESCRIPTION:   The specimen is received in saline and consists of 2 pieces of tan-pink,  focally cauterized soft tissue measuring 1.8 x 1.0 x 0.4 cm and 2.4 x  1.1 x 0.4 cm.  Representative sections are submitted in 1 block. Craig Staggers  10/11/2019)     Final Diagnosis performed by Gillie Manners, MD.   Electronically  signed 10/11/2019  Technical and / or Professional components performed at [facility]. [facility], [place] [address], [place], [place] [address].   Immunohistochemistry Technical component (if applicable) was performed  at [facility]. [address] [place],  [place], [place] [address].   IMMUNOHISTOCHEMISTRY DISCLAIMER (if applicable):  Some of these immunohistochemical stains may have been developed and the  performance characteristics determine by [facility]. Some  may not have been cleared or approved by the U.S. Food and Drug  Administration. The FDA has determined that such clearance or approval  is not necessary. This test is used for clinical purposes. It should not  be regarded as investigational or for research. This laboratory is  certified under the Clinical Laboratory  Improvement Amendments of 1988  (CLIA-88) as qualified to perform high complexity clinical laboratory  testing.  The controls stained appropriately.   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   amLODipine 5 MG tablet Commonly known as: NORVASC Take 1 tablet (5 mg total) by mouth daily. Start taking on: October 13, 2019 What changed:  medication strength how much to take   carvedilol 25 MG tablet Commonly known as: COREG Take 1 tablet (25 mg total) by mouth 2 (two) times daily with a meal. What changed:  medication strength how much to take when to take this   Envarsus XR 1 MG Tb24 Generic drug: Tacrolimus ER Take 1 mg by mouth daily.   furosemide 40 MG tablet Commonly known as: LASIX Take 40 mg by mouth 2 (two) times a week. Tuesday and Thursday   HYDROcodone-acetaminophen 5-325 MG tablet Commonly known as: NORCO/VICODIN Take 1 tablet by mouth every 6 (six) hours as needed for up to 3 days for moderate pain.   isosorbide-hydrALAZINE 20-37.5 MG tablet Commonly known as: BIDIL Take 2 tablets by mouth 3 (three) times daily.   Nexplanon 68 MG Impl implant Generic drug: etonogestrel 68 mg by Subdermal route once. Jan 2018   omeprazole 20 MG capsule Commonly known as: PRILOSEC Take 20 mg by mouth daily.   sevelamer carbonate 800 MG tablet Commonly known as: RENVELA Take 800 mg by mouth 3 (three) times daily with meals.   sulfamethoxazole-trimethoprim 400-80 MG tablet Commonly known as: BACTRIM Take 1 tablet by mouth every Monday, Wednesday, and Friday.       Follow-up Information     [facility] Follow up on 10/19/2019.   Specialty: Cardiology Why: 2:30  Contact information: [address] [id number] [place] [address] [phone]        Tommie Raymond, NP. Go on 10/28/2019.   Specialty: Cardiology Why: You have a cardiology appointment with Ms. Kathyrn Drown, NP on Thursday, 10/28/19 at 10:15am Contact information: [address] [place] [address] [phone]         Ivin Poot, MD. Go on  10/27/2019.   Specialty: Cardiothoracic Surgery Why: You have an appointment with Dr. Prescott Gum on Wednesday 10/27/19 at [place] information: [address] [place] [address] [phone]         [facility]. Go on 10/19/2019.   Specialty: Cardiothoracic Surgery Why: You have an appointment at Dr. Thayer Ohm office on Tuesday 10/19/19 at 10:30am for suture removal.  Contact information: [place], [place] [phone]           Signed: Antony Odea , PA-C 10/12/2019, 10:58 AM  DC instructions reviewed with patient patient examined  and medical record reviewed,agree with above note. Tharon Aquas Trigt III 10/12/2019

## 2019-10-12 NOTE — Discharge Instructions (Signed)
Conn's Current Therapy 2017 (pp. 79-158). [place], [place]: [employer]."> Stoelting's Anesthesia and Co-Existing Disease (7th ed., pp. 225-236). [place]: [employer]> Braunwald's Heart Disease (11th ed., pp. 174-243). [place]: Elsevier.">  Pericardial Effusion  Pericardial effusion is a buildup of fluid around the heart. The heart is surrounded by a thin, double-layered sac (pericardium). When fluid builds up in this sac, it can put too much pressure on the heart and make it harder for the heart to pump blood (cardiac tamponade). This can be life-threatening. What are the causes? Often, the cause of pericardial effusion is not known (idiopathic effusion). In some cases, the condition may be caused by:  Infections from a virus, fungus, parasite, or bacteria.  Damage to the pericardium from heart surgery or a heart attack.  Inflammatory diseases, such as rheumatoid arthritis or lupus.  Kidney or thyroid disease.  Cancer or treatment for cancer, including radiation or chemotherapy.  Certain medicines, including medicines for tuberculosis or seizures.  Chest injury. What are the signs or symptoms? Pericardial effusion may not cause symptoms at first, especially if the fluid builds up slowly. In time, pressure on the heart may cause:  Chest pain and trouble breathing. This may include pain and shortness of breath that get worse when lying down.  Dizziness and fainting.  Coughing and hiccups.  Skipped heartbeats.  Anxiety and confusion.  A bluish skin color (cyanosis).  Swollen legs and ankles.  A feeling of fullness in the chest. How is this diagnosed? This condition is diagnosed based on your symptoms and testing, which may include:  A test that creates ultrasound images of your heart (echocardiogram).  A test to examine the electrical functions of your heart (electrocardiogram).  Chest X-ray.  CT scan.  MRI.  Blood tests. How is this  treated? Treatment for this condition depends on the cause of your condition and how severe your symptoms are. Treatment may include:  Medicines, such as: ? NSAIDs, such as ibuprofen. ? Anti-inflammatory medicines, such as steroids. ? Medicines to fight infection, such as antibiotics.  Hospital treatment. This may be necessary if the fluid around the heart prevents it from pumping enough blood. Treatment in the hospital may include: ? IV fluids. ? Breathing support.  Surgery. This may be needed in severe cases. Surgery may include: ? A procedure to remove fluid from the pericardium by placing a needle into it (pericardiocentesis). ? A procedure to make a permanent opening in the pericardium (pericardial window). ? Open heart surgery. Follow these instructions at home:  Take over-the-counter and prescription medicines only as told by your health care provider.  If you were prescribed a medicine to fight infection, such as antibiotic medicine, take it as told by your health care provider. Do not stop taking the medicine even if you start to feel better.  Rest as told by your health care provider. Ask your health care provider what activities are safe for you.  Keep all follow-up visits as told by your health care provider. This is important. Contact a health care provider if:  You have a cough or hiccups that do not go away.  You have severe swelling in your legs or ankles. Get help right away if you:  Have fast or irregular heartbeats (palpitations).  Feel dizzy or light-headed.  Faint.  Have chest pain.  Have trouble breathing. These symptoms may represent a serious problem that is an emergency. Do not wait to see if the symptoms will go away. Get medical help right   away. Call your local emergency services (911 in the [place] Do not drive yourself to the hospital. Summary  Pericardial effusion is a buildup of fluid around the heart. The fluid can eventually prevent the  heart from pumping enough blood (cardiac tamponade), which can be life-threatening.  Pericardial effusion may not cause symptoms at first.  Treatment for pericardial effusion depends on the cause of your condition and how severe your symptoms are. In severe cases, hospital treatment or surgery may be required.  Rest as told by your health care provider. Ask your health care provider what activities are safe for you. This information is not intended to replace advice given to you by your health care provider. Make sure you discuss any questions you have with your health care provider. Document Revised: 01/12/2019 Document Reviewed: 01/12/2019 Elsevier Patient Education  2020 Elsevier Inc.  

## 2019-10-12 NOTE — Progress Notes (Addendum)
[address]       [place] [address]             [phone]      4 Days Post-Op Procedure(s) (LRB): SUBXYPHOID PERICARDIAL WINDOW (N/A) TRANSESOPHAGEAL ECHOCARDIOGRAM (TEE) (N/A) Insertion Central Line Adult Subjective: Feels ok, no new c/o- pain is well controlled  Objective: Vital signs in last 24 hours: Temp:  [98.2 F (36.8 C)-99.3 F (37.4 C)] 98.3 F (36.8 C) (02/02 0422) Pulse Rate:  [84-98] 94 (02/01 2111) Cardiac Rhythm: Normal sinus rhythm (02/02 0012) Resp:  [16-28] 17 (02/02 0422) BP: (102-137)/(48-94) 124/67 (02/02 0422) SpO2:  [98 %] 98 % (02/01 1608) Weight:  [54.8 kg-56.1 kg] 54.8 kg (02/02 0605)  Hemodynamic parameters for last 24 hours:    Intake/Output from previous day: 02/01 0701 - 02/02 0700 In: 240 [P.O.:230; I.V.:10] Out: 1090 [Chest Tube:90] Intake/Output this shift: No intake/output data recorded.  General appearance: alert, cooperative and no distress Heart: regular rate and rhythm and no rub Lungs: mildly dim in bases Abdomen: benign Wound: dressings CDI  Lab Results: Recent Labs    10/11/19 1246 10/12/19 0610  WBC 5.7 5.2  HGB 7.8* 7.9*  HCT 25.9* 26.1*  PLT 110* 137*   BMET:  Recent Labs    10/10/19 0513 10/11/19 1246  NA 135 137  K 4.0 4.2  CL 98 99  CO2 25 23  GLUCOSE 85 110*  BUN 33* 49*  CREATININE 7.36* 9.90*  CALCIUM 9.0 9.0    PT/INR: No results for input(s): LABPROT, INR in the last 72 hours. ABG    Component Value Date/Time   PHART 7.447 10/07/2019 1408   HCO3 32.7 (H) 10/07/2019 1414   HCO3 31.8 (H) 10/07/2019 1414   TCO2 34 (H) 10/07/2019 1414   TCO2 33 (H) 10/07/2019 1414   ACIDBASEDEF 8.0 (H) 08/23/2019 0346   O2SAT 76.0 10/07/2019 1414   O2SAT 76.0 10/07/2019 1414   CBG (last 3)  No results for input(s): GLUCAP in the last 72 hours.  Meds Scheduled Meds: . acetaminophen  1,000 mg Oral Q6H   Or  . acetaminophen (TYLENOL) oral liquid 160 mg/5 mL  1,000 mg Oral Q6H   . amLODipine  5 mg Oral Daily  . bisacodyl  10 mg Oral Daily  . carvedilol  25 mg Oral BID WC  . darbepoetin (ARANESP) injection - DIALYSIS  200 mcg Intravenous Q Mon-HD  . isosorbide-hydrALAZINE  2 tablet Oral TID  . senna-docusate  1 tablet Oral QHS  . sevelamer carbonate  800 mg Oral TID WC  . sodium chloride flush  3 mL Intravenous Q12H  . sulfamethoxazole-trimethoprim  1 tablet Oral Q M,W,F  . Tacrolimus ER  1 mg Oral Daily   Continuous Infusions: . sodium chloride    . sodium chloride     PRN Meds:.Place/Maintain arterial line **AND** sodium chloride, sodium chloride, fentaNYL (SUBLIMAZE) injection, HYDROcodone-acetaminophen, labetalol, ondansetron (ZOFRAN) IV, povidone-iodine, sodium chloride flush  Xrays No results found.  Assessment/Plan: S/P Procedure(s) (LRB): SUBXYPHOID PERICARDIAL WINDOW (N/A) TRANSESOPHAGEAL ECHOCARDIOGRAM (TEE) (N/A) Insertion Central Line Adult  1 doing well from surgical perspective- tube is out 2 hemodyn stable in sinus rhythm 3 H/H are stable, no leukocytosis, thrombocytopenia trend is improving 4 CXR minor effus 5 BS well controlled 6 home when medically stable from cardiology and nephrology perspective  LOS: 5 days    Dominique Lawrence [facility] 10/12/2019 Pager [phone]  DC instructions reviewed with patient Will see back  in office for wound check, CXR patient examined and medical record reviewed,agree with above note. Tharon Aquas Trigt III 10/12/2019

## 2019-10-12 NOTE — Progress Notes (Addendum)
Advanced Heart Failure Rounding Note   Subjective:    Denies SOB. Wants to go home.   Objective:   Weight Range:  Vital Signs:   Temp:  [98.2 F (36.8 C)-99.3 F (37.4 C)] 98.3 F (36.8 C) (02/02 0754) Pulse Rate:  [84-98] 92 (02/02 0754) Resp:  [16-28] 18 (02/02 0754) BP: (102-137)/(48-94) 127/77 (02/02 0754) SpO2:  [92 %-98 %] 92 % (02/02 0754) Weight:  [54.8 kg-56.1 kg] 54.8 kg (02/02 0605) Last BM Date: 10/11/19  Weight change: Filed Weights   10/11/19 1230 10/11/19 1608 10/12/19 0605  Weight: 56.1 kg 56.1 kg 54.8 kg    Intake/Output:   Intake/Output Summary (Last 24 hours) at 10/12/2019 0858 Last data filed at 10/11/2019 2152 Gross per 24 hour  Intake 10 ml  Output 1080 ml  Net -1070 ml     Physical Exam: General:  No resp difficulty HEENT: normal Neck: supple. no JVD. Carotids 2+ bilat; no bruits. No lymphadenopathy or thryomegaly appreciated. Cor: PMI nondisplaced. Regular rate & rhythm. No rubs, gallops or murmurs. R HD catheter.  Lungs: clear Abdomen: soft, nontender, nondistended. No hepatosplenomegaly. No bruits or masses. Good bowel sounds. Extremities: no cyanosis, clubbing, rash, edema Neuro: alert & orientedx3, cranial nerves grossly intact. moves all 4 extremities w/o difficulty. Affect pleasant  Telemetry: SR 80-90s personally reviewed.    Labs: Basic Metabolic Panel: Recent Labs  Lab 10/08/19 1149 10/08/19 1149 10/09/19 0447 10/09/19 0447 10/10/19 0513 10/11/19 1246 10/12/19 0610  NA 139  --  137  --  135 137 137  K 3.9  --  4.2  --  4.0 4.2 4.0  CL 102  --  100  --  98 99 102  CO2 25  --  25  --  25 23 26   GLUCOSE 106*  --  82  --  85 110* 95  BUN 20  --  17  --  33* 49* 21*  CREATININE 6.28*  --  4.99*  --  7.36* 9.90* 5.31*  CALCIUM 9.4   < > 9.3   < > 9.0 9.0 9.4  PHOS 6.4*  --   --   --   --  7.4* 5.6*   < > = values in this interval not displayed.    Liver Function Tests: Recent Labs  Lab 10/07/19 1040  10/07/19 1040 10/07/19 1800 10/08/19 1149 10/10/19 0513 10/11/19 1246 10/12/19 0610  AST 21  --  22  --  12*  --   --   ALT 11  --  12  --  7  --   --   ALKPHOS 103  --  96  --  66  --   --   BILITOT 1.8*  --  1.2  --  0.4  --   --   PROT 7.7  --  7.8  --  5.1*  --   --   ALBUMIN 3.8   < > 4.0 2.9* 2.5* 2.5* 2.4*   < > = values in this interval not displayed.   No results for input(s): LIPASE, AMYLASE in the last 168 hours. No results for input(s): AMMONIA in the last 168 hours.  CBC: Recent Labs  Lab 10/07/19 1100 10/07/19 1408 10/08/19 1149 10/09/19 0447 10/10/19 0513 10/11/19 1246 10/12/19 0610  WBC 4.8   < > 5.3 6.9 6.7 5.7 5.2  NEUTROABS 3.5  --   --   --   --   --   --   HGB 10.1*   < >  8.5* 7.8* 7.5* 7.8* 7.9*  HCT 32.2*   < > 28.6* 25.4* 24.4* 25.9* 26.1*  MCV 92.0   < > 95.7 93.7 95.7 95.6 95.3  PLT 102*   < > 90* 108* 89* 110* 137*   < > = values in this interval not displayed.    Cardiac Enzymes: No results for input(s): CKTOTAL, CKMB, CKMBINDEX, TROPONINI in the last 168 hours.  BNP: BNP (last 3 results) Recent Labs    08/23/19 0441 08/24/19 0540  BNP >4,500.0* >4,500.0*    ProBNP (last 3 results) No results for input(s): PROBNP in the last 8760 hours.    Other results:  Imaging: . DG Chest Port 1 View .  Marland Kitchen Result Date: 10/12/2019 . CLINICAL DATA:  Pericardial effusion EXAM: PORTABLE CHEST 1 VIEW COMPARISON:  10/09/2019 FINDINGS: There has been a slight interval increase in small, layering bilateral pleural effusions, particularly on the right side. There has been interval removal of previously seen pericardial drainage tubes. Unchanged cardiomegaly. Large-bore right neck multi lumen vascular catheter. Left subclavian vascular catheter. IMPRESSION: 1. Slight interval increase in small, layering bilateral pleural effusions, particularly on the right side. 2.  Interval removal of previously seen pericardial drainage tubes. 3.  Unchanged  cardiomegaly. Electronically Signed   By: Eddie Candle M.D.   On: 10/12/2019 08:40  .    Medications:     Scheduled Medications: . . . acetaminophen .  1,000 mg . Oral . Q6H  .  . Or  . . . acetaminophen (TYLENOL) oral liquid 160 mg/5 mL .  1,000 mg . Oral . Q6H  . Marland Kitchen Marland Kitchen amLODipine .  5 mg . Oral . Daily  . . . bisacodyl .  10 mg . Oral . Daily  . . . carvedilol .  25 mg . Oral . BID WC  . Marland Kitchen . darbepoetin (ARANESP) injection - DIALYSIS .  200 mcg . Intravenous . Q Mon-HD  . Marland Kitchen . isosorbide-hydrALAZINE .  2 tablet . Oral . TID  . Marland Kitchen . senna-docusate .  1 tablet . Oral . QHS  . Marland Kitchen . sevelamer carbonate .  800 mg . Oral . TID WC  . Marland Kitchen . sodium chloride flush .  3 mL . Intravenous . Q12H  . Marland Kitchen . sulfamethoxazole-trimethoprim .  1 tablet . Oral . Q M,W,F  . . . Tacrolimus ER .  1 mg . Oral . Daily  .   Infusions: . . . sodium chloride .    . . . sodium chloride .    Marland Kitchen   PRN Medications: . Place/Maintain arterial line **AND** sodium chloride, sodium chloride, fentaNYL (SUBLIMAZE) injection, HYDROcodone-acetaminophen, labetalol, ondansetron (ZOFRAN) IV, povidone-iodine, sodium chloride flush   Assessment/Plan:    1. Pericardial Effusion: large posterior effusion noted on echo 1/21, previously moderate on prior echo 12/20  - s/p pericardial window on 1/29. 800cc fluid.  Pericardial biopsy sent for amyloid . Cultures negative. Cytology and serology still pending.  -CT out 2/1   2. Cardiomyopathy: new diagnosed made 08/2019 while admitted for covid 19 infection. EF 25-30%. Hs troponin level at time of initial diganosis was mildly elevated but flat trend, 72>>79. ? Viral Myocarditis vs HTN. Unable to get cMRi due to renal function.  - Repeat Echo 1/21 EF 30-35%, global hypokinesis, mod LVH, RV normal size and hyperdynamic systolic function - Cath no CAD - GDMT limited by CKD (no ARB/ARNi, MRA nor digoxin) - on carvedilol 25 bid and Bidil 2 TID -  Stable.    3. ESRD: chronic renal failure  since age 73 secondary to [place] Syndrome. 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 She is on HD MWF and followed by Dr. Justin Mend.  - management per nephrology . Due for HD today   4. HTN - Stable.    5. H/o COVID 19 Infection: - diagnosed 08/2019 - negative repeat covid test 10/05/19   6. Secondary hyperparathyroidism:  - 2/2 renal failure  - needs total parathyroidectomy but surgery postpone due to above cardiac issues   Meds for d/c  Coreg 25 mg twice a day  Bidil 2 tabs three times a day  Amlodipine 5 mg daily.    Should be ok for discharge. HF Follow set up 10/19/19 at 2:30    Length of Stay: [place] NP-C  10/12/2019, 8:58 AM  Advanced Heart Failure Team Pager [phone] (M-F; 7a - 4p)  Please contact [facility] for night-coverage after hours (4p -7a ) and weekends on [email or web address]   Patient seen and examined with the above-signed Advanced Practice Provider and/or Housestaff. I personally reviewed laboratory data, imaging studies and relevant notes. I independently examined the patient and formulated the important aspects of the plan. I have edited the note to reflect any of my changes or salient points. I have personally discussed the plan with the patient and/or family.  She looks much better. Pericardial drain pulled.   ANA/ANCA/RF all negative.   Cytology with just reactive mesothelial cells.   [place] Red stain of pericardium negative.   Suspect effusion just inflammatory.   HTN much improved. Continue current meds. Volume status controlled with HD.   Ok for d/c today from our standpoint.   Will f/u in HF Clinic. Will need repeat echo 4-6 weeks.   Glori Bickers, MD  7:42 PM

## 2019-10-12 NOTE — TOC Benefit Eligibility Note (Signed)
Transition of Care [facility] Benefit Eligibility Note    Patient Details  Name: Dominique Lawrence MRN: [id number] Date of Birth: 2001-03-25    Patient has Medicaid patient will pay 3.00 for prescription drugs.                            [name] Phone Number: 10/12/2019, 9:10 AM

## 2019-10-12 NOTE — Progress Notes (Signed)
Pt sts she walked last night, approximately 150 ft. Sts she is getting stronger and walking around room. Discussed HF booklet with pt. She voices understanding. Encouraged her to discuss diet with RD at HD center. Gave walking guidelines for home. Uriah 12:51 PM 10/12/2019

## 2019-10-12 NOTE — Progress Notes (Signed)
Dominique Lawrence Progress Note  Subjective: Feels 'fine'; states has been tolerating HD ok recently and yesterday went well, UF 1L.  Denies dyspnea, edema. Drain out.   Vitals:   10/12/19 0046 10/12/19 0422 10/12/19 0605 10/12/19 0754  BP: (!) 102/48 124/67  127/77  Pulse:    92  Resp: 16 17  18   Temp: 99.3 F (37.4 C) 98.3 F (36.8 C)  98.3 F (36.8 C)  TempSrc: Oral Oral  Oral  SpO2:    92%  Weight:   54.8 kg   Height:        Exam: General: WDWN NAD, well appearing female Lungs: CTA bilaterally. No wheeze, rales or rhonchi Heart: RRR. No murmur, rubs or gallops Lower extremities:  no edema, ischemic changes, or open wounds  Neuro: alert, nonfocal, Ox 3 Dialysis Access: [facility]  RIJ   Home meds:  - amloidipine 10/ carvedilol 6.25 bid/ furosemide 40 2x per wk/   - tacrolimus ER 1mg  qd  - omeprazole 40 qd/ bactrim 1 400-80 on mwf/ renvela ac tid  - prn's/ vitamins/ supplements    [facility]  3h 62min  350/A1.5  56.5kg  2/2 bath  TDC RIJ  Hep none  Mircera 225 mcg q2wks - last 09/24/19 Parsabiv 5mg  qHD Calcitriol 2 mcg PO qHD   Assessment/Plan: 1.  Pericardial Effusion - sp pericardial window on 1/29 by TCTS. Per primary team. Drain out and ok for d/c from their perspective.  2.  ESRD -  On HD MWF.  Next HD tomorrow if remains admitted, o/w can go to outpt HD.  3.  Volume  - Below dry wt. Small UF w/ hd tomorrow, will need new EDW - If d/c would set 55kg and follow.  4.  HTN/ hx syst CHF EF 30% - cards adjusting meds , added bidil, weaning off norvasc and ^'d coreg 5.  Anemia of CKD - Hgb 8.5 > 7.5 > 7.96, on max ESA Aranesp 268mcg qFri as outpt, but missed Fri here so dosed 2/1 with HD.  6.  Secondary Hyperparathyroidism -  Ca at goal. Will check phos. Continue VDRA and binders.  Needs parathyroidectomy but surgery postponed for cardiac clearance.   7.  Nutrition - Renal diet w/fluid restrictions once advanced.  8. Hx COVID - neg test 9. H/o failed renal Tx  - on low dose prograf 1mg  /day 10. Thrombocytopenia - 90-100s this admission, appear improved to 137 this AM, no bleeding.  Heparin catheter pack o/w none with tx.  CTM for now.   OK to discharge from nephrology perspective.     Jannifer Hick MD [facility]   Inpatient medications: . acetaminophen  1,000 mg Oral Q6H   Or  . acetaminophen (TYLENOL) oral liquid 160 mg/5 mL  1,000 mg Oral Q6H  . amLODipine  5 mg Oral Daily  . bisacodyl  10 mg Oral Daily  . carvedilol  25 mg Oral BID WC  . darbepoetin (ARANESP) injection - DIALYSIS  200 mcg Intravenous Q Mon-HD  . isosorbide-hydrALAZINE  2 tablet Oral TID  . senna-docusate  1 tablet Oral QHS  . sevelamer carbonate  800 mg Oral TID WC  . sodium chloride flush  3 mL Intravenous Q12H  . sulfamethoxazole-trimethoprim  1 tablet Oral Q M,W,F  . Tacrolimus ER  1 mg Oral Daily   . sodium chloride    . sodium chloride     Place/Maintain arterial line **AND** sodium chloride, sodium chloride, fentaNYL (SUBLIMAZE) injection, HYDROcodone-acetaminophen, labetalol,  ondansetron (ZOFRAN) IV, povidone-iodine, sodium chloride flush Recent Labs  Lab 10/11/19 1246 10/12/19 0610  NA 137 137  K 4.2 4.0  CL 99 102  CO2 23 26  GLUCOSE 110* 95  BUN 49* 21*  CREATININE 9.90* 5.31*  CALCIUM 9.0 9.4  PHOS 7.4* 5.6*

## 2019-10-12 NOTE — Progress Notes (Signed)
CT Surgery  L subclavian central line pulled, pressure held, sterile dressing placed. Pt instructed to stay in bed 30 min

## 2019-10-12 NOTE — Progress Notes (Signed)
Pt discharge home. Central and PIV  d/c. Discharge instructions given to pt. Answered all pt questions. RN picked pt home medicine from pharmacy and hand delivered them to pt.

## 2019-10-12 NOTE — TOC Progression Note (Signed)
Transition of Care [facility]) - Progression Note    Patient Details  Name: Dominique Lawrence MRN: [id number] Date of Birth: 06-Apr-2001  [facility] CM/SW Contact  Zenon Mayo, RN Phone Number: 10/12/2019, 12:49 PM  Clinical Narrative:    Patient is for dc today, bidil is a medicaid preferred drug, patient has Medicaid and her co pay cost is 3.00.  Staff RN has informed patient of this information.          Expected Discharge Plan and Services           Expected Discharge Date: 10/12/19                                     Social Determinants of Health (SDOH) Interventions    Readmission Risk Interventions No flowsheet data found.

## 2019-10-13 ENCOUNTER — Encounter: Payer: Medicare Other | Admitting: Surgery

## 2019-10-13 LAB — RENAL FUNCTION PANEL
Albumin: 2.4 g/dL — ABNORMAL LOW (ref 3.5–5.0)
Anion gap: 9 (ref 5–15)
BUN: 21 mg/dL — ABNORMAL HIGH (ref 6–20)
CO2: 26 mmol/L (ref 22–32)
Calcium: 9.4 mg/dL (ref 8.9–10.3)
Chloride: 102 mmol/L (ref 98–111)
Creatinine, Ser: 5.31 mg/dL — ABNORMAL HIGH (ref 0.44–1.00)
GFR calc Af Amer: 12 mL/min — ABNORMAL LOW (ref >60)
GFR calc non Af Amer: 11 mL/min — ABNORMAL LOW (ref >60)
Glucose, Bld: 95 mg/dL (ref 70–99)
Phosphorus: 5.6 mg/dL — ABNORMAL HIGH (ref 2.5–4.6)
Potassium: 4 mmol/L (ref 3.5–5.1)
Sodium: 137 mmol/L (ref 135–145)

## 2019-10-13 LAB — AEROBIC/ANAEROBIC CULTURE W GRAM STAIN (SURGICAL/DEEP WOUND)
Culture: NO GROWTH
Culture: NO GROWTH
Gram Stain: NONE SEEN
Gram Stain: NONE SEEN

## 2019-10-19 ENCOUNTER — Telehealth (HOSPITAL_COMMUNITY): Payer: Self-pay | Admitting: Pharmacy Technician

## 2019-10-19 ENCOUNTER — Ambulatory Visit (HOSPITAL_COMMUNITY)
Admit: 2019-10-19 | Discharge: 2019-10-19 | Disposition: A | Discharge status: Home / Self Care | Payer: Medicare Other | Attending: Adult Health | Admitting: Adult Health

## 2019-10-19 ENCOUNTER — Other Ambulatory Visit: Payer: Self-pay

## 2019-10-19 ENCOUNTER — Encounter (HOSPITAL_COMMUNITY): Payer: Self-pay

## 2019-10-19 ENCOUNTER — Other Ambulatory Visit: Payer: Self-pay | Admitting: Cardiothoracic Surgery

## 2019-10-19 VITALS — BP 220/140 | HR 109 | Wt 130.8 lb

## 2019-10-19 DIAGNOSIS — Z79899 Other long term (current) drug therapy: Secondary | ICD-10-CM | POA: Insufficient documentation

## 2019-10-19 DIAGNOSIS — I5022 Chronic systolic (congestive) heart failure: Secondary | ICD-10-CM | POA: Diagnosis present

## 2019-10-19 DIAGNOSIS — Z8616 Personal history of COVID-19: Secondary | ICD-10-CM | POA: Insufficient documentation

## 2019-10-19 DIAGNOSIS — Z905 Acquired absence of kidney: Secondary | ICD-10-CM | POA: Diagnosis not present

## 2019-10-19 DIAGNOSIS — I132 Hypertensive heart and chronic kidney disease with heart failure and with stage 5 chronic kidney disease, or end stage renal disease: Secondary | ICD-10-CM | POA: Diagnosis not present

## 2019-10-19 DIAGNOSIS — I10 Essential (primary) hypertension: Secondary | ICD-10-CM

## 2019-10-19 DIAGNOSIS — Z9689 Presence of other specified functional implants: Secondary | ICD-10-CM | POA: Insufficient documentation

## 2019-10-19 DIAGNOSIS — Z992 Dependence on renal dialysis: Secondary | ICD-10-CM | POA: Diagnosis not present

## 2019-10-19 DIAGNOSIS — I313 Pericardial effusion (noninflammatory): Secondary | ICD-10-CM | POA: Diagnosis not present

## 2019-10-19 DIAGNOSIS — N2581 Secondary hyperparathyroidism of renal origin: Secondary | ICD-10-CM | POA: Insufficient documentation

## 2019-10-19 DIAGNOSIS — N186 End stage renal disease: Secondary | ICD-10-CM | POA: Insufficient documentation

## 2019-10-19 DIAGNOSIS — Q8781 Alport syndrome: Secondary | ICD-10-CM | POA: Insufficient documentation

## 2019-10-19 DIAGNOSIS — I3139 Other pericardial effusion (noninflammatory): Secondary | ICD-10-CM

## 2019-10-19 MED ORDER — ISOSORBIDE MONONITRATE ER 30 MG PO TB24: 30.0000 mg | ORAL_TABLET | Freq: Every day | ORAL | 3 refills | Status: DC

## 2019-10-19 MED ORDER — HYDRALAZINE HCL 25 MG PO TABS: 75.0000 mg | ORAL_TABLET | Freq: Three times a day (TID) | ORAL | 3 refills | Status: DC

## 2019-10-19 NOTE — Patient Instructions (Addendum)
START Bidil 2 tabs three times daily until samples run out.  THEN START IMDUR 30mg  tab daily AND Hydralazine 75mg  (3 tabs) three times daily.  You will need to speak with your medicare case worker in order to either get medicare part D or to submit a claim to prove you are not covered by part D to medicaid.  Your physician has requested that you have an echocardiogram. Echocardiography is a painless test that uses sound waves to create images of your heart. It provides your doctor with information about the size and shape of your heart and how well your heart's chambers and valves are working. This procedure takes approximately one hour. There are no restrictions for this procedure. This will be done at you follow up appointment.  Please follow up with the [facility] in 4 weeks with an echocardiogram.  At the [facility], you and your health needs are our priority. As part of our continuing mission to provide you with exceptional heart care, we have created designated Provider Care Teams. These Care Teams include your primary Cardiologist (physician) and Advanced Practice Providers (APPs- Physician Assistants and Nurse Practitioners) who all work together to provide you with the care you need, when you need it.   You may see any of the following providers on your designated Care Team at your next follow up: Marland Kitchen Dr Glori Bickers . Dr Loralie Champagne . Darrick Grinder, NP . Lyda Jester, PA . Audry Riles, PharmD   Please be sure to bring in all your medications bottles to every appointment.

## 2019-10-19 NOTE — Progress Notes (Signed)
PCP: Dr Paul Dykes Nephrology: Dr Justin Mend  Primary HF Cardiologist: Dr Haroldine Laws.  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 She is on HD MWF and followed by Dr. Justin Mend. 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 Now w/ subsequent secondary hyperparathyroidism, needing total parathyroidectomy.   Of note, she was diagnosed w/ Covid 19 infection 08/2019 and hospitalized, treated w/ decadron and remdesivir . Had 2D echo 08/23/19 showing moderate pericardial effusion but no tamponade physiology, moderate MR/TR and severely reduced LVEF 25-30%. Hs troponin level was mildly elevated but flat trend, 72>>79. ? Myocarditis. Unable to get cMRi due to renal function. HF was treated w/ lasix + HD for volume management + coreg and hydralazine.   She was seen in general cardiology clinic 10/06/19 by Dr. Johnsie Cancel for preoperative clearance prior to planned parathyroidectomy. Echo was repeated and demonstrated increased size of pericardial effusion, w/ progression from moderate to severe. No evidence of tamponade. LVEF 30-35%. RV normal size w/ hyperdynamic systolic function.   Admitted 10/07/19 after RHC/LHC for planned pericardial window. Cath showed normal coronaries, mild pulmonary HTN, EF 35%, and large pericardial effusion. On 10/08/19 had pericardial window with 700 cc removed. Cytology, ANA, ANCA, RF, congo red stain negative. Cytology with reactive mesothelial cells.   Today she returns for HF follow up.Overall feeling ok but complaining of irritation from 2 sutures.  Denies SOB/PND/Orthopnea. Continues on HD 3 days a week. Yesterday she missed dialysis. Appetite ok. No fever or chills. Weight at home  Has been stable. Taking all medications but has been unable to take bidil due to cost. Says when she went to pick up bidil it was 400.00.   ROS: All systems negative except as listed  in HPI, PMH and Problem List.  SH:  Social History   Socioeconomic History  . Marital status: Married    Spouse name: Not on file  . Number of children: Not on file  . Years of education: Not on file  . Highest education level: Not on file  Occupational History  . Not on file  Tobacco Use  . Smoking status: Never Smoker  . Smokeless tobacco: Never Used  Substance and Sexual Activity  . Alcohol use: No  . Drug use: No  . Sexual activity: Not Currently    Birth control/protection: Implant  Other Topics Concern  . Not on file  Social History Narrative   Lives with Mom, Sister, & 2 Brothers. No pets at home. No one smokes.   Social Determinants of Health   Financial Resource Strain:   . Difficulty of Paying Living Expenses: Not on file  Food Insecurity:   . Worried About Charity fundraiser in the Last Year: Not on file  . Ran Out of Food in the Last Year: Not on file  Transportation Needs:   . Lack of Transportation (Medical): Not on file  . Lack of Transportation (Non-Medical): Not on file  Physical Activity:   . Days of Exercise per Week: Not on file  . Minutes of Exercise per Session: Not on file  Stress:   . Feeling of Stress : Not on file  Social Connections:   . Frequency of Communication with Friends and Family: Not on file  . Frequency of Social Gatherings with Friends and Family: Not on file  . Attends Religious Services: Not on file  . Active Member of  Clubs or Organizations: Not on file  . Attends Archivist Meetings: Not on file  . Marital Status: Not on file  Intimate Partner Violence:   . Fear of Current or Ex-Partner: Not on file  . Emotionally Abused: Not on file  . Physically Abused: Not on file  . Sexually Abused: Not on file    FH:  Family History  Problem Relation Age of Onset  . Alport syndrome Brother     Past Medical History:  Diagnosis Date  . Alport syndrome   . Alport syndrome   . Anemia   . Constipation 12/18/2013  .  Depression 07/28/2013  . ESRD (end stage renal disease) ([place])     Transplant failed Hemo MWF at [facility].  Marland Kitchen History of blood transfusion   . Hypertension   . Influenza B   . S/P wisdom tooth extraction 06/22/2016   Right mandibular dental pain  . Sinus tachycardia     Current Outpatient Medications  Medication Sig Dispense Refill  . acetaminophen (TYLENOL) 500 MG tablet Take 500 mg by mouth every 6 (six) hours as needed for moderate pain or headache.    Marland Kitchen amLODipine (NORVASC) 5 MG tablet Take 1 tablet (5 mg total) by mouth daily. 30 tablet 2  . carvedilol (COREG) 25 MG tablet Take 1 tablet (25 mg total) by mouth 2 (two) times daily with a meal. 60 tablet 2  . ENVARSUS XR 1 MG TB24 Take 1 mg by mouth daily.    Marland Kitchen etonogestrel (NEXPLANON) 68 MG IMPL implant 68 mg by Subdermal route once. Jan 2018    . furosemide (LASIX) 40 MG tablet Take 40 mg by mouth 2 (two) times a week. Tuesday and Thursday    . omeprazole (PRILOSEC) 20 MG capsule Take 20 mg by mouth daily.     . sevelamer carbonate (RENVELA) 800 MG tablet Take 800 mg by mouth 3 (three) times daily with meals.    Marland Kitchen sulfamethoxazole-trimethoprim (BACTRIM) 400-80 MG tablet Take 1 tablet by mouth every Monday, Wednesday, and Friday.     . isosorbide-hydrALAZINE (BIDIL) 20-37.5 MG tablet Take 2 tablets by mouth 3 (three) times daily. (Patient not taking: Reported on 10/19/2019) 100 tablet 2   No current facility-administered medications for this encounter.    Vitals:   10/19/19 1451  BP: (!) 220/140  Pulse: (!) 109  SpO2: 98%  Weight: 59.3 kg   Wt Readings from Last 3 Encounters:  10/19/19 59.3 kg  10/12/19 54.8 kg  10/06/19 58.2 kg    PHYSICAL EXAM: General:  Appears chronically ill. No resp difficulty HEENT: normal Neck: supple. JVP flat. Carotids 2+ bilaterally; no bruits. No lymphadenopathy or thryomegaly appreciated.  Cor: PMI normal. Regular rate & rhythm. No rubs, gallops or murmurs. R upper  chest HD catheter Lungs: clear Abdomen: soft, nontender, nondistended. No hepatosplenomegaly. No bruits or masses. Good bowel sounds. 2 sutures approximated. No drainage.  Extremities: no cyanosis, clubbing, rash, edema Neuro: alert & orientedx3, cranial nerves grossly intact. Moves all 4 extremities w/o difficulty. Affect pleasant.  EKG: Sinus Tach 106 bpm   ASSESSMENT & PLAN: 1. Chronic Systolic HF  Newly diagnosed made 08/2019 while admitted for covid 19 infection. EF25-30%. Hs troponin level at time of initial diganosis was mildly elevated but flat trend, 72>>79. ? Viral Myocarditisvs HTN. Unable to get cMRi due to renal function.  - Repeat Echo 1/21 EF 30-35%, global hypokinesis, mod LVH, RV normal size and hyperdynamic systolic function - Cath  no CAD  - NYNA II. Volume status mildly elevated but she missed HD on 2/8. On lasix per nephrology for non dialysis days/   - Continue carvedilol 25 mg twice a day.  - She did not start bidil at d/c due to cost. Today she was given samples if bidil that will last for the next day. She will then start hydralazine 75 mg three times a day and imdur 30 mg daily.  - We will try to get authorization for bidil.  - Plan to repeat ECHO in 4 weeks. This was set up today.   2. Pericardial Effusion  S/P large posterioreffusion noted on echo 1/21, previously moderate on prior echo 12/20  - s/p pericardial window on 1/29. 800cc fluid.  Cultures negative ANA/ANCA/RF all negative.  Cytology with just reactive mesothelial cells.  [place] Red stain of pericardium negative.  Suspect effusion just inflammatory - I removed 2 sutures from surgery. She has follow up with Dr Darcey Nora   3. ESRD - Chronic renal failure since age 65 secondary to Alport Syndrome. 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 She is on HD MWF and followed by Dr. Justin Mend.   4. HTN  Has been off bidil. See above.   Follow up with Dr Vaughan Browner in 4 weeks  with an ECHO.   We called her pharmacy and there was a question about her the last time she picked her meds. She was able to show a message from the pharmacy and the day she picked up her meds?. Today we sent in script for hydralazine/imdur so hopefully she will be able to pick.    Darrick Grinder NP-C  4:28 PM

## 2019-10-19 NOTE — Telephone Encounter (Signed)
Received message that patient needs prior authorization for BiDil. Medicaid thinks that the patient has other primary coverage so they are not paying towards medication. Relayed message to clinic. Patient would need to contact case worker to let them know she does not have other insurance, if that is the case.  Could not find pharmacy coverage through eligibility search.  Charlann Boxer, CPhT

## 2019-10-20 ENCOUNTER — Ambulatory Visit: Payer: Self-pay | Admitting: Cardiothoracic Surgery

## 2019-10-21 ENCOUNTER — Other Ambulatory Visit: Payer: Self-pay

## 2019-10-21 ENCOUNTER — Encounter: Payer: Self-pay | Admitting: Cardiothoracic Surgery

## 2019-10-21 ENCOUNTER — Ambulatory Visit (INDEPENDENT_AMBULATORY_CARE_PROVIDER_SITE_OTHER): Payer: Self-pay | Admitting: Cardiothoracic Surgery

## 2019-10-21 ENCOUNTER — Ambulatory Visit
Admission: RE | Admit: 2019-10-21 | Discharge: 2019-10-21 | Disposition: A | Discharge status: Home / Self Care | Payer: Medicare Other | Source: Ambulatory Visit | Attending: Cardiothoracic Surgery | Admitting: Cardiothoracic Surgery

## 2019-10-21 VITALS — BP 173/127 | HR 100 | Temp 97.7°F | Resp 20 | Ht 64.0 in | Wt 124.0 lb

## 2019-10-21 DIAGNOSIS — I313 Pericardial effusion (noninflammatory): Secondary | ICD-10-CM

## 2019-10-21 DIAGNOSIS — I3139 Other pericardial effusion (noninflammatory): Secondary | ICD-10-CM

## 2019-10-21 DIAGNOSIS — Z09 Encounter for follow-up examination after completed treatment for conditions other than malignant neoplasm: Secondary | ICD-10-CM

## 2019-10-21 NOTE — Progress Notes (Addendum)
PCP is Ettefagh, Paul Dykes, MD Referring Provider is Bensimhon, Shaune Pascal, MD  Chief Complaint  Patient presents with  . Routine Post Op    f/u from surgery with CXR s/p Subxiphoid pericardial window, drainage of 800 mL pericardial effusion. 10/08/19    HPI: Patient returns for postop visit after subxiphoid pericardial window and drainage of a 1.5 L pericardial effusion related to her chronic renal failure.  She has had no symptoms of shortness of breath.  The surgical incision has healed.  The drain incisions have had the sutures removed and have some necrotic fat and eschar which was cleaned with Betadine.  There is no cellulitis.  I placed Neosporin Band-Aid dressings on the 2 small skin incisions for the drains.  Patient was diagnosed with nonischemic cardiomyopathy EF 25 to 30% when she presented with pericardial effusion.  Is felt to be possibly viral myocarditis.  I examined the chest x-ray performed today which shows clear lung fields, no pleural effusion, stable mediastinal silhouette.  Patient was told she should change the Neosporin Band-Aid dressings daily otherwise use soap and water skin care on the surgical incision in the 2 drain sites.  Past Medical History:  Diagnosis Date  . Alport syndrome   . Alport syndrome   . Anemia   . Constipation 12/18/2013  . Depression 07/28/2013  . ESRD (end stage renal disease) ([place])     Transplant failed Hemo MWF at [facility].  Marland Kitchen History of blood transfusion   . Hypertension   . Influenza B   . S/P wisdom tooth extraction 06/22/2016   Right mandibular dental pain  . Sinus tachycardia     Past Surgical History:  Procedure Laterality Date  . CENTRAL VENOUS CATHETER INSERTION  10/08/2019   Procedure: Insertion Central Line Adult;  Surgeon: Prescott Gum, Collier Salina, MD;  Location: [place];  Service: Thoracic;;  . Hemodialysis catheter placed Right   . 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 NEPHRECTOMY Right   . RIGHT/LEFT  HEART CATH AND CORONARY ANGIOGRAPHY N/A 10/07/2019   Procedure: RIGHT/LEFT HEART CATH AND CORONARY ANGIOGRAPHY;  Surgeon: Jolaine Artist, MD;  Location: [place] [facility];  Service: Cardiovascular;  Laterality: N/A;  . SUBXYPHOID PERICARDIAL WINDOW N/A 10/08/2019   Procedure: SUBXYPHOID PERICARDIAL WINDOW;  Surgeon: Ivin Poot, MD;  Location: [place];  Service: Thoracic;  Laterality: N/A;  . TEE WITHOUT CARDIOVERSION N/A 10/08/2019   Procedure: TRANSESOPHAGEAL ECHOCARDIOGRAM (TEE);  Surgeon: Prescott Gum, Collier Salina, MD;  Location: [facility] OR;  Service: Thoracic;  Laterality: N/A;    Family History  Problem Relation Age of Onset  . Alport syndrome Brother     Social History Social History   Tobacco Use  . Smoking status: Never Smoker  . Smokeless tobacco: Never Used  Substance Use Topics  . Alcohol use: No  . Drug use: No    Current Outpatient Medications  Medication Sig Dispense Refill  . acetaminophen (TYLENOL) 500 MG tablet Take 500 mg by mouth every 6 (six) hours as needed for moderate pain or headache.    Marland Kitchen amLODipine (NORVASC) 5 MG tablet Take 1 tablet (5 mg total) by mouth daily. 30 tablet 2  . carvedilol (COREG) 25 MG tablet Take 1 tablet (25 mg total) by mouth 2 (two) times daily with a meal. 60 tablet 2  . ENVARSUS XR 1 MG TB24 Take 1 mg by mouth daily.    Marland Kitchen etonogestrel (NEXPLANON) 68 MG IMPL implant 68 mg by Subdermal route once.  Jan 2018    . furosemide (LASIX) 40 MG tablet Take 40 mg by mouth 2 (two) times a week. Tuesday and Thursday    . hydrALAZINE (APRESOLINE) 25 MG tablet Take 3 tablets (75 mg total) by mouth 3 (three) times daily. 270 tablet 3  . isosorbide mononitrate (IMDUR) 30 MG 24 hr tablet Take 1 tablet (30 mg total) by mouth daily. 90 tablet 3  . omeprazole (PRILOSEC) 20 MG capsule Take 20 mg by mouth daily.     . sevelamer carbonate (RENVELA) 800 MG tablet Take 800 mg by mouth 3 (three) times daily with meals.    Marland Kitchen sulfamethoxazole-trimethoprim (BACTRIM)  400-80 MG tablet Take 1 tablet by mouth every Monday, Wednesday, and Friday.      No current facility-administered medications for this visit.    Allergies  Allergen Reactions  . Enalapril Other (See Comments)    AKI  . Chlorhexidine Gluconate Hives  . Ibuprofen Other (See Comments)    Pt not able to take ibuprofen due to Alport's syndrome    Review of Systems  No fever No shortness of breath No ankle edema No headache or dizziness  BP (!) 173/127 (BP Location: Right Arm, Cuff Size: Normal)   Pulse 100   Temp 97.7 F (36.5 C) (Skin)   Resp 20   Ht 5\' 4"  (1.626 m)   Wt 124 lb (56.2 kg)   SpO2 100% Comment: RA  BMI 21.28 kg/m  Physical Exam      Exam    General- alert and comfortable.  Subxiphoid surgical incision healing well.    Neck- no JVD, no cervical adenopathy palpable, no carotid bruit   Lungs- clear without rales, wheezes   Cor- regular rate and rhythm, no murmur , gallop   Abdomen- soft, non-tender   Extremities - warm, non-tender, minimal edema   Neuro- oriented, appropriate, no focal weakness   Diagnostic Tests: Chest x-ray performed and reviewed as noted above  Impression: Good recovery after subxiphoid pericardial window for drainage of a large uremic pericardial effusion  The patient is scheduled to have a follow-up echocardiogram to assess her LV function in about a month.  At that time any recurrence of pericardial effusion would also be assessed.  Plan: Patient will be able to resume driving and normal activities.  No heavy lifting or exertional activities till March 1.  Return as needed.   Len Childs, MD [facility] [phone]

## 2019-10-22 NOTE — Progress Notes (Addendum)
Cardiology Office Note   Date:  10/28/2019   ID:  Dominique Lawrence, DOB 01-Feb-2001, MRN [id number]  PCP:  Carmie End, MD  Cardiologist:  Dr. Johnsie Cancel, MD   Chief Complaint  Patient presents with  . Follow-up   Pt seen today via telephone encounter. Patient verbally verified to proceed with telemedicine visit with myself. Total call time approximately 20 minutes.    History of Present Illness: Dominique Lawrence is a 19 y.o. female who presents for 3-week follow-up, seen for Dr. Johnsie Cancel.   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 Justin Mend. 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 Now w/ subsequent secondary hyperparathyroidism, needing totalparathyroidectomy.   She was diagnosed w/ Covid 19 infection 08/2019, was hospitalized and treated w/decadron and remdesivir. Echocardiogram at that time showed moderate pericardial effusionbut no tamponade physiology,moderate MR/TR and severely reduced LVEF 25-30%. Hs troponin level was mildly elevated but flat trend, 72>>79. Questionable myocarditis. Unable to get cMRi due to renal function. HF symptoms were treated w/ lasix and HD for volume management along with carvedilol and hydralazine.   She was seen by Dr. Johnsie Cancel 10/06/19 for preoperative clearance prior to plannedparathyroidectomy. Echo was repeated and demonstrated increased size of pericardial effusion, w/ progression from moderate to severe. No evidence of tamponade. LVEF 30-35%. RV normal size w/ hyperdynamic systolic function.   She was then admitted 10/07/19 after RHC/LHC for planned pericardial window. Cath showed normal coronaries, mild pulmonary HTN, EF 35%, and large pericardial effusion. On 10/08/19 had pericardial window with 700 cc removed. Cytology, ANA, ANCA, RF, congo red stain negative. Cytology with reactive mesothelial cells.    She was then seen by Amy Clegg 10/19/2019 with AHF clinic and was overall feeling okay. She had some mild complaints of suture irritation.  BiDil was found to be cost prohibitive.   Today Dominique Lawrence is seen via telemedicine visit secondary to inclement weather and office closure.  She states that she has been doing better since last office visit.  She has no specific complaints today.  Reports no surgical site irritation including drainage or redness.  Denies chest pain or shortness of breath.  BP is very elevated today however she reports she has no home BP cuff.  BP recorded from what she remembers it being at HD yesterday.  She is asymptomatic with no visual changes, headaches or dizziness.  We discussed medication titration.  She states that prior to her recent hospitalization she was on amlodipine 10 and was discharged on amlodipine 5.  Given this, we will increase amlodipine to 10 mg p.o. daily.  Discussed obtaining home BP cuff to assist with monitoring.  She has an office follow-up with Dr. Sung Amabile next month for which she was encouraged to keep.  Overall seems to be doing well under her current circumstances.  Past Medical History:  Diagnosis Date  . Alport syndrome   . Alport syndrome   . Anemia   . Constipation 12/18/2013  . Depression 07/28/2013  . ESRD (end stage renal disease) ([place])     Transplant failed Hemo MWF at [facility]  Marland Kitchen History of blood transfusion   . Hypertension   . Influenza B   . S/P wisdom tooth extraction 06/22/2016   Right mandibular dental pain  . Sinus tachycardia     Past Surgical History:  Procedure Laterality Date  . CENTRAL  VENOUS CATHETER INSERTION  10/08/2019   Procedure: Insertion Central Line Adult;  Surgeon: Prescott Gum, Collier Salina, MD;  Location: [facility];  Service: Thoracic;;  . Hemodialysis catheter placed Right   . 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 NEPHRECTOMY Right   . RIGHT/LEFT HEART CATH AND CORONARY ANGIOGRAPHY N/A  10/07/2019   Procedure: RIGHT/LEFT HEART CATH AND CORONARY ANGIOGRAPHY;  Surgeon: Jolaine Artist, MD;  Location: [place] [facility];  Service: Cardiovascular;  Laterality: N/A;  . SUBXYPHOID PERICARDIAL WINDOW N/A 10/08/2019   Procedure: SUBXYPHOID PERICARDIAL WINDOW;  Surgeon: Ivin Poot, MD;  Location: [place];  Service: Thoracic;  Laterality: N/A;  . TEE WITHOUT CARDIOVERSION N/A 10/08/2019   Procedure: TRANSESOPHAGEAL ECHOCARDIOGRAM (TEE);  Surgeon: Prescott Gum, Collier Salina, MD;  Location: [facility];  Service: Thoracic;  Laterality: N/A;     Current Outpatient Medications  Medication Sig Dispense Refill  . acetaminophen (TYLENOL) 500 MG tablet Take 500 mg by mouth every 6 (six) hours as needed for moderate pain or headache.    Marland Kitchen amLODipine (NORVASC) 5 MG tablet Take 1 tablet (5 mg total) by mouth daily. 30 tablet 2  . carvedilol (COREG) 25 MG tablet Take 1 tablet (25 mg total) by mouth 2 (two) times daily with a meal. 60 tablet 2  . ENVARSUS XR 1 MG TB24 Take 1 mg by mouth daily.    Marland Kitchen etonogestrel (NEXPLANON) 68 MG IMPL implant 68 mg by Subdermal route once. Jan 2018    . furosemide (LASIX) 40 MG tablet Take 40 mg by mouth 2 (two) times a week. Tuesday and Thursday    . hydrALAZINE (APRESOLINE) 25 MG tablet Take 3 tablets (75 mg total) by mouth 3 (three) times daily. 270 tablet 3  . isosorbide mononitrate (IMDUR) 30 MG 24 hr tablet Take 1 tablet (30 mg total) by mouth daily. 90 tablet 3  . omeprazole (PRILOSEC) 20 MG capsule Take 20 mg by mouth daily.     . sevelamer carbonate (RENVELA) 800 MG tablet Take 800 mg by mouth 3 (three) times daily with meals.     No current facility-administered medications for this visit.    Allergies:   Enalapril, Chlorhexidine gluconate, and Ibuprofen    Social History:  The patient  reports that she has never smoked. She has never used smokeless tobacco. She reports that she does not drink alcohol or use drugs.   Family History:  The patient's family  history includes Alport syndrome in her brother.    ROS:  Please see the history of present illness. Otherwise, review of systems are positive for none.  All other systems are reviewed and negative.    PHYSICAL EXAM: VS:  BP (!) 180/130 Comment: bp from 10/27/19  Pulse 100 Comment: hr from 10/27/19  Ht 5\' 4"  (1.626 m)   Wt 123 lb (55.8 kg)   BMI 21.11 kg/m  , BMI Body mass index is 21.11 kg/m.   General: Well developed,  NAD Neuro: Alert and oriented. MAE spontaneously. Psych: Responds to questions appropriately with normal affect.     EKG:  EKG is not ordered today.  Recent Labs: 08/24/2019: B Natriuretic Peptide >4,500.0 10/07/2019: TSH 3.297 10/10/2019: ALT 7 10/12/2019: BUN 21; Creatinine, Ser 5.31; Hemoglobin 7.9; Platelets 137; Potassium 4.0; Sodium 137    Lipid Panel    Component Value Date/Time   CHOL 157 01/06/2014 1717   TRIG 224 (H) 01/06/2014 1717   HDL 38 01/06/2014 1717   CHOLHDL 4.1 01/06/2014 1717   VLDL 45 (  H) 01/06/2014 1717   LDLCALC 74 01/06/2014 1717      Wt Readings from Last 3 Encounters:  10/28/19 123 lb (55.8 kg)  10/21/19 124 lb (56.2 kg)  10/19/19 130 lb 12.8 oz (59.3 kg)    Other studies Reviewed: Additional studies/ records that were reviewed today include:   Right/left cardiac catheterization 10/07/2019: Normal coronary arteries 2. Mild pulmonary HTN due to elevated left-sided pressures and high output 3. EF 35% by echo 4. Large pericardial effusion with no evidence of tamponade physiology  Echocardiogram 10/06/2019:  1. Left ventricular ejection fraction, by visual estimation, is 30 to  35%. The left ventricle has mildly decreased function. There is moderately  increased left ventricular hypertrophy.  2. The left ventricle demonstrates global hypokinesis.  3. Global right ventricle has hyperdynamic systolic function.The right  ventricular size is small. No increase in right ventricular wall  thickness.  4. Left atrial size was  moderately dilated.  5. Right atrial size was normal.  6. Large pericardial effusion.  7. The pericardial effusion is circumferential.  8. Effusion meaures up to 3.5 at the LV posterior wall.  9. The mitral valve is abnormal. Mild to moderate mitral valve  regurgitation.  10. The tricuspid valve is grossly normal.  11. The tricuspid valve is grossly normal. Tricuspid valve regurgitation  is mild.  12. The aortic valve is tricuspid. Aortic valve regurgitation is not  visualized. Mild aortic valve sclerosis without stenosis.  13. The pulmonic valve was grossly normal. Pulmonic valve regurgitation is  not visualized.  14. The inferior vena cava is normal in size with <50% respiratory  variability, suggesting right atrial pressure of 8 mmHg.  15. Hyperechoic floating structure in the RA, may be a dialysis catheter  tip.    ASSESSMENT AND PLAN:  1.  Chronic systolic HF: -Diagnosed 93/7169 after admission for COVID-19 infection found to have an LVEF of 25 to 30% with questionable viral myocarditis versus uncontrolled hypertension. Unable to proceed with see MRI secondary to renal dysfunction -Repeat echocardiogram 1/21 with EF of 30 to 35% with global hypokinesis, moderate LVH, RV normal size and hyperdynamic systolic function.  She underwent a cardiac catheterization prior to pericardial window for large pericardial effusion with no CAD. -Last seen by advanced heart failure clinic with recommendations to continue carvedilol 25 mg p.o. twice daily -BiDil found to be cost prohibitive therefore she was started on hydralazine 75 mg 3 times daily as well as Imdur 30 mg p.o. daily -Plan to repeat echocardiogram in 4 weeks>> already scheduled -Denies shortness of breath, LE edema, orthopnea symptoms -No specific complaints -Continue with current regimen  2.  Pericardial effusion: -s/p pericardial window 10/08/2019 with 800 cc removed -Cultures negative, ANA/ANCA/RF negative -Cytology with  reactive mesothelial cells -[place] red stain of pericardium negative -Suspect inflammatory effusion -Follows with Dr. Darcey Nora  3.  ESRD: -Chronic renal failure stage [age] years old secondary to Alport syndrome -Failed renal transplant 08/2018 and underwent explant nephrectomy 04/2019-on HD MWF followed by Dr. Justin Mend  4.  HTN: -Uncontrolled, BP verbally reported as 180/130 HD yesterday (2 antihypertensive medications) -Unable to afford BiDil -Transition to hydralazine 75 mg 3 times daily, Imdur 30 mg p.o. daily -Previously on amlodipine 10 and was discharged from recent hospitalization on amlodipine 5 therefore will increase back to 10 mg p.o. daily -We will likely need further titration of carvedilol, hydralazine -Patient is asymptomatic   Current medicines are reviewed at length with the patient today.  The patient does not have  concerns regarding medicines.  The following changes have been made: Increase amlodipine to 10 mg p.o. daily  Labs/ tests ordered today include: None  No orders of the defined types were placed in this encounter.   Disposition:   FU with Dr. Haroldine Laws in 1 month  Signed, Kathyrn Drown, NP  10/28/2019 10:23 AM    [address], [place], [place]  [address] Phone: [phone]; Fax: [phone]

## 2019-10-27 ENCOUNTER — Ambulatory Visit: Payer: Medicare Other | Admitting: Cardiothoracic Surgery

## 2019-10-28 ENCOUNTER — Other Ambulatory Visit: Payer: Self-pay

## 2019-10-28 ENCOUNTER — Encounter: Payer: Self-pay | Admitting: Cardiology

## 2019-10-28 ENCOUNTER — Ambulatory Visit (INDEPENDENT_AMBULATORY_CARE_PROVIDER_SITE_OTHER): Payer: Medicare Other | Admitting: Cardiology

## 2019-10-28 VITALS — BP 180/130 | HR 100 | Ht 64.0 in | Wt 123.0 lb

## 2019-10-28 DIAGNOSIS — I10 Essential (primary) hypertension: Secondary | ICD-10-CM | POA: Diagnosis not present

## 2019-10-28 DIAGNOSIS — I313 Pericardial effusion (noninflammatory): Secondary | ICD-10-CM

## 2019-10-28 DIAGNOSIS — I3139 Other pericardial effusion (noninflammatory): Secondary | ICD-10-CM

## 2019-10-28 DIAGNOSIS — I5022 Chronic systolic (congestive) heart failure: Secondary | ICD-10-CM

## 2019-10-28 DIAGNOSIS — N186 End stage renal disease: Secondary | ICD-10-CM | POA: Diagnosis not present

## 2019-10-28 MED ORDER — AMLODIPINE BESYLATE 10 MG PO TABS: 10.0000 mg | ORAL_TABLET | Freq: Every day | ORAL | 1 refills | Status: DC

## 2019-10-28 NOTE — Patient Instructions (Addendum)
Medication Instructions:   Your physician has recommended you make the following change in your medication:   1) Increase your Amlodipine to 10 mg, 1 tablet by mouth once a day   *If you need a refill on your cardiac medications before your next appointment, please call your pharmacy*  Lab Work:  None ordered today  If you have labs (blood work) drawn today and your tests are completely normal, you will receive your results only by: Marland Kitchen MyChart Message (if you have MyChart) OR . A paper copy in the mail If you have any lab test that is abnormal or we need to change your treatment, we will call you to review the results.  Testing/Procedures:  None ordered today  Follow-Up: At [facility], you and your health needs are our priority.  As part of our continuing mission to provide you with exceptional heart care, we have created designated Provider Care Teams.  These Care Teams include your primary Cardiologist (physician) and Advanced Practice Providers (APPs -  Physician Assistants and Nurse Practitioners) who all work together to provide you with the care you need, when you need it.  Your next appointment:    Keep your appointment on 11/25/19 with Dr. Haroldine Laws

## 2019-11-23 LAB — ACID FAST CULTURE WITH REFLEXED SENSITIVITIES (MYCOBACTERIA): Acid Fast Culture: NEGATIVE

## 2019-11-25 ENCOUNTER — Encounter (HOSPITAL_COMMUNITY): Payer: Medicare Other | Admitting: Internal Medicine

## 2019-11-25 ENCOUNTER — Ambulatory Visit (HOSPITAL_COMMUNITY): Payer: Medicare Other

## 2020-01-18 ENCOUNTER — Other Ambulatory Visit: Payer: Self-pay | Admitting: *Deleted

## 2020-01-18 DIAGNOSIS — N186 End stage renal disease: Secondary | ICD-10-CM

## 2020-01-18 DIAGNOSIS — Z992 Dependence on renal dialysis: Secondary | ICD-10-CM

## 2020-01-21 ENCOUNTER — Telehealth (HOSPITAL_COMMUNITY): Payer: Self-pay

## 2020-01-21 NOTE — Telephone Encounter (Signed)

## 2020-01-24 ENCOUNTER — Encounter: Payer: Self-pay | Admitting: Surgery

## 2020-01-24 ENCOUNTER — Other Ambulatory Visit: Payer: Self-pay

## 2020-01-24 ENCOUNTER — Ambulatory Visit (INDEPENDENT_AMBULATORY_CARE_PROVIDER_SITE_OTHER): Payer: Medicare Other | Admitting: Surgery

## 2020-01-24 ENCOUNTER — Ambulatory Visit (INDEPENDENT_AMBULATORY_CARE_PROVIDER_SITE_OTHER)
Admission: RE | Admit: 2020-01-24 | Discharge: 2020-01-24 | Disposition: A | Discharge status: Home / Self Care | Payer: Medicare Other | Source: Ambulatory Visit | Attending: Surgery | Admitting: Surgery

## 2020-01-24 ENCOUNTER — Ambulatory Visit (HOSPITAL_COMMUNITY)
Admission: RE | Admit: 2020-01-24 | Discharge: 2020-01-24 | Disposition: A | Discharge status: Home / Self Care | Payer: Medicare Other | Source: Ambulatory Visit | Attending: Surgery | Admitting: Surgery

## 2020-01-24 VITALS — BP 174/124 | HR 98 | Temp 97.7°F | Resp 20 | Ht 64.0 in | Wt 128.0 lb

## 2020-01-24 DIAGNOSIS — I10 Essential (primary) hypertension: Secondary | ICD-10-CM | POA: Insufficient documentation

## 2020-01-24 DIAGNOSIS — I313 Pericardial effusion (noninflammatory): Secondary | ICD-10-CM | POA: Diagnosis present

## 2020-01-24 DIAGNOSIS — Z992 Dependence on renal dialysis: Secondary | ICD-10-CM

## 2020-01-24 DIAGNOSIS — N186 End stage renal disease: Secondary | ICD-10-CM | POA: Diagnosis present

## 2020-01-24 DIAGNOSIS — I5022 Chronic systolic (congestive) heart failure: Secondary | ICD-10-CM | POA: Diagnosis not present

## 2020-01-24 DIAGNOSIS — E049 Nontoxic goiter, unspecified: Secondary | ICD-10-CM | POA: Insufficient documentation

## 2020-01-24 NOTE — H&P (View-Only) (Signed)
[facility] [place]  Patient name: Dominique Lawrence MRN: [id number] DOB: [birth year] Sex: female   REQUESTING PROVIDER:    Dr. 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  She has a history of failed kidney transplant.  She has now been on hemodialysis for 1 year with a catheter on Monday Wednesday Friday.  She is medically managed for hypertension.  She is a non-smoker.  She is right-handed.  PAST MEDICAL HISTORY    Past Medical History:  Diagnosis Date  . Alport syndrome   . Alport syndrome   . Anemia   . Constipation 12/18/2013  . Depression 07/28/2013  . ESRD (end stage renal disease) ([place])     Transplant failed Hemo MWF at [id number].  Marland Kitchen History of blood transfusion   . Hypertension   . Influenza B   . S/P wisdom tooth extraction 06/22/2016   Right mandibular dental pain  . Sinus tachycardia      FAMILY HISTORY   Family History  Problem Relation Age of Onset  . Alport syndrome Brother     SOCIAL HISTORY:   Social History   Socioeconomic History  . Marital status: Married    Spouse name: Not on file  . Number of children: Not on file  . Years of education: Not on file  . Highest education level: Not on file  Occupational History  . Not on file  Tobacco Use  . Smoking status: Never Smoker  . Smokeless tobacco: Never Used  Substance and Sexual Activity  . Alcohol use: No  . Drug use: No  . Sexual activity: Not Currently    Birth control/protection: Implant  Other Topics Concern  . Not on file  Social History Narrative   Lives with Mom, Sister, & 2 Brothers. No pets at home. No one smokes.   Social Determinants of Health   Financial Resource Strain:   . Difficulty of Paying Living Expenses:   Food Insecurity:   . Worried About Sales executive in the Last Year:   . Arboriculturist in the Last Year:   Transportation Needs:   . Film/video editor (Medical):   Marland Kitchen Lack of Transportation (Non-Medical):   Physical Activity:   . Days of Exercise per Week:   . Minutes of Exercise per Session:   Stress:   . Feeling of Stress :   Social Connections:   . Frequency of Communication with Friends and Family:   . Frequency of Social Gatherings with Friends and Family:   . Attends Religious Services:   . Active Member of Clubs or Organizations:   . Attends Archivist Meetings:   Marland Kitchen Marital Status:   Intimate Partner Violence:   . Fear of Current or Ex-Partner:   . Emotionally Abused:   Marland Kitchen Physically Abused:   . Sexually Abused:     ALLERGIES:    Allergies  Allergen Reactions  . Enalapril Other (See Comments)    AKI  . Chlorhexidine Gluconate Hives  . Ibuprofen Other (See Comments)    Pt not able to take ibuprofen due to Alport's syndrome    CURRENT MEDICATIONS:    Current Outpatient Medications  Medication Sig Dispense Refill  . acetaminophen (TYLENOL) 500 MG tablet Take 500 mg by mouth every 6 (six) hours as needed for moderate pain  or headache.    Marland Kitchen amLODipine (NORVASC) 10 MG tablet Take 1 tablet (10 mg total) by mouth daily. 90 tablet 1  . carvedilol (COREG) 25 MG tablet Take 1 tablet (25 mg total) by mouth 2 (two) times daily with a meal. 60 tablet 2  . etonogestrel (NEXPLANON) 68 MG IMPL implant 68 mg by Subdermal route once. Jan 2018    . furosemide (LASIX) 40 MG tablet Take 40 mg by mouth 2 (two) times a week. Tuesday and Thursday    . sevelamer carbonate (RENVELA) 800 MG tablet Take 800 mg by mouth 3 (three) times daily with meals.    . hydrALAZINE (APRESOLINE) 25 MG tablet Take 3 tablets (75 mg total) by mouth 3 (three) times daily. 270 tablet 3  . isosorbide mononitrate (IMDUR) 30 MG 24 hr tablet Take 1 tablet (30 mg total) by mouth daily. 90 tablet 3   No current facility-administered  medications for this visit.    REVIEW OF SYSTEMS:   [X]  denotes positive finding, [ ]  denotes negative finding Cardiac  Comments:  Chest pain or chest pressure:    Shortness of breath upon exertion:    Short of breath when lying flat:    Irregular heart rhythm:        Vascular    Pain in calf, thigh, or hip brought on by ambulation:    Pain in feet at night that wakes you up from your sleep:     Blood clot in your veins:    Leg swelling:         Pulmonary    Oxygen at home:    Productive cough:     Wheezing:         Neurologic    Sudden weakness in arms or legs:     Sudden numbness in arms or legs:     Sudden onset of difficulty speaking or slurred speech:    Temporary loss of vision in one eye:     Problems with dizziness:         Gastrointestinal    Blood in stool:      Vomited blood:         Genitourinary    Burning when urinating:     Blood in urine:        Psychiatric    Major depression:         Hematologic    Bleeding problems:    Problems with blood clotting too easily:        Skin    Rashes or ulcers:        Constitutional    Fever or chills:     PHYSICAL EXAM:   Vitals:   01/24/20 0908 01/24/20 0910  BP: (!) 175/128 (!) 174/124  Pulse: 98   Resp: 20   Temp: 97.7 F (36.5 C)   SpO2: 99%   Weight: 128 lb (58.1 kg)   Height: 5\' 4"  (1.626 m)     GENERAL: The patient is a well-nourished female, in no acute distress. The vital signs are documented above. CARDIAC: There is a regular rate and rhythm.  VASCULAR: Palpable radial pulses PULMONARY: Nonlabored respirations MUSCULOSKELETAL: There are no major deformities or cyanosis. NEUROLOGIC: No focal weakness or paresthesias are detected. SKIN: There are no ulcers or rashes noted. PSYCHIATRIC: The patient has a normal affect.  STUDIES:   I have reviewed the following:  ARTERIAL: Right Pre-Dialysis Findings:  +-----------------------+----------+--------------------+---------+--------  +   Location  PSV (cm/s)Intralum. Diam. (cm)Waveform  Comments  +-----------------------+----------+--------------------+---------+--------  +  Brachial Antecub. fossa50    0.47        triphasic        +-----------------------+----------+--------------------+---------+--------  +  Radial Art at Wrist  34    0.14        triphasic        +-----------------------+----------+--------------------+---------+--------  +  Ulnar Art at Wrist   46    0.24        triphasic        +-----------------------+----------+--------------------+---------+--------  +      Left Pre-Dialysis Findings:  +-----------------------+----------+--------------------+---------+--------  +  Location        PSV (cm/s)Intralum. Diam. (cm)Waveform  Comments  +-----------------------+----------+--------------------+---------+--------  +  Brachial Antecub. fossa53    0.39        triphasic        +-----------------------+----------+--------------------+---------+--------  +  Radial Art at Wrist  50    0.19        triphasic        +-----------------------+----------+--------------------+---------+--------  +  Ulnar Art at Wrist   0     0.18        triphasic        +-----------------------+----------+--------------------+---------+--------  +    +-----------------+-------------+----------+-----------------------------+  Right Cephalic  Diameter (cm)Depth (cm)     Findings        +-----------------+-------------+----------+-----------------------------+  Shoulder       0.27     0.09                   +-----------------+-------------+----------+-----------------------------+  Prox upper arm    0.15     0.37                    +-----------------+-------------+----------+-----------------------------+  Mid upper arm    0.29     0.38                   +-----------------+-------------+----------+-----------------------------+  Dist upper arm    0.23     0.47  branching and thickened walls  +-----------------+-------------+----------+-----------------------------+  Antecubital fossa  0.27     0.12                   +-----------------+-------------+----------+-----------------------------+  Prox forearm     0.16     0.35       branching       +-----------------+-------------+----------+-----------------------------+  Mid forearm     0.13     0.31                   +-----------------+-------------+----------+-----------------------------+  Dist forearm     0.12     0.15                   +-----------------+-------------+----------+-----------------------------+  Wrist        0.15     0.23                   +-----------------+-------------+----------+-----------------------------+   +-----------------+-------------+----------+--------------+  Right Basilic  Diameter (cm)Depth (cm)  Findings    +-----------------+-------------+----------+--------------+  Prox upper arm    0.30                 +-----------------+-------------+----------+--------------+  Mid upper arm    0.17                 +-----------------+-------------+----------+--------------+  Dist upper arm    0.20         branching    +-----------------+-------------+----------+--------------+  Antecubital fossa  0.13                 +-----------------+-------------+----------+--------------+  Prox forearm     0.11                  +-----------------+-------------+----------+--------------+  Mid forearm     0.11                 +-----------------+-------------+----------+--------------+  Distal forearm              not visualized  +-----------------+-------------+----------+--------------+  Elbow                  not visualized  +-----------------+-------------+----------+--------------+  Wrist                  not visualized  +-----------------+-------------+----------+--------------+   +-----------------+-------------+----------+--------------+   +-----------------+-------------+----------+--------------+  Left Cephalic  Diameter (cm)Depth (cm)  Findings    +-----------------+-------------+----------+--------------+  Shoulder       0.28     0.53           +-----------------+-------------+----------+--------------+  Prox upper arm    0.23     0.39           +-----------------+-------------+----------+--------------+  Mid upper arm    0.20     0.36           +-----------------+-------------+----------+--------------+  Dist upper arm    0.26     0.32           +-----------------+-------------+----------+--------------+  Antecubital fossa  0.38     0.14   branching    +-----------------+-------------+----------+--------------+  Prox forearm     0.14                 +-----------------+-------------+----------+--------------+  Mid forearm     0.16                 +-----------------+-------------+----------+--------------+  Dist forearm     0.16                 +-----------------+-------------+----------+--------------+  Wrist                  not visualized  +-----------------+-------------+----------+--------------+    +-----------------+-------------+----------+--------------+  Left Basilic   Diameter (cm)Depth (cm)  Findings    +-----------------+-------------+----------+--------------+  Prox upper arm    0.29                 +-----------------+-------------+----------+--------------+  Mid upper arm    0.29                 +-----------------+-------------+----------+--------------+  Dist upper arm    0.31                 +-----------------+-------------+----------+--------------+  Antecubital fossa  0.27                 +-----------------+-------------+----------+--------------+  Prox forearm     0.19                 +-----------------+-------------+----------+--------------+  Mid forearm               not visualized  +-----------------+-------------+----------+--------------+  Distal forearm              not visualized  +-----------------+-------------+----------+--------------+  Elbow                  not visualized  +-----------------+-------------+----------+--------------+  Wrist                  not visualized  +-----------------+-------------+----------+--------------+  ASSESSMENT and PLAN   End-stage renal disease: The patient is right-handed.  She has relatively small surface veins.  I would like to do  everything possible to try to get a fistula placed.  I think her best option is a basilic vein on the left.  I discussed doing this in a staged approach.  The first date is been scheduled for Thursday, June 3.  We discussed the risk of not maturity as well as the risk of steal syndrome and she wants to proceed.   Leia Alf, MD, FACS [facility] [phone] Pager [phone]

## 2020-01-24 NOTE — Progress Notes (Signed)
[facility] [place]  Patient name: Dominique Lawrence MRN: [id number] DOB: March 18, 2001 Sex: female   REQUESTING PROVIDER:    Dr. 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  She has a history of failed kidney transplant.  She has now been on hemodialysis for 1 year with a catheter on Monday Wednesday Friday.  She is medically managed for hypertension.  She is a non-smoker.  She is right-handed.  PAST MEDICAL HISTORY    Past Medical History:  Diagnosis Date  . Alport syndrome   . Alport syndrome   . Anemia   . Constipation 12/18/2013  . Depression 07/28/2013  . ESRD (end stage renal disease) ([place])     Transplant failed Hemo MWF at [facility].  Marland Kitchen History of blood transfusion   . Hypertension   . Influenza B   . S/P wisdom tooth extraction 06/22/2016   Right mandibular dental pain  . Sinus tachycardia      FAMILY HISTORY   Family History  Problem Relation Age of Onset  . Alport syndrome Brother     SOCIAL HISTORY:   Social History   Socioeconomic History  . Marital status: Married    Spouse name: Not on file  . Number of children: Not on file  . Years of education: Not on file  . Highest education level: Not on file  Occupational History  . Not on file  Tobacco Use  . Smoking status: Never Smoker  . Smokeless tobacco: Never Used  Substance and Sexual Activity  . Alcohol use: No  . Drug use: No  . Sexual activity: Not Currently    Birth control/protection: Implant  Other Topics Concern  . Not on file  Social History Narrative   Lives with Mom, Sister, & 2 Brothers. No pets at home. No one smokes.   Social Determinants of Health   Financial Resource Strain:   . Difficulty of Paying Living Expenses:   Food Insecurity:   . Worried About Sales executive in the Last Year:   . Arboriculturist in the Last Year:   Transportation Needs:   . Film/video editor (Medical):   Marland Kitchen Lack of Transportation (Non-Medical):   Physical Activity:   . Days of Exercise per Week:   . Minutes of Exercise per Session:   Stress:   . Feeling of Stress :   Social Connections:   . Frequency of Communication with Friends and Family:   . Frequency of Social Gatherings with Friends and Family:   . Attends Religious Services:   . Active Member of Clubs or Organizations:   . Attends Archivist Meetings:   Marland Kitchen Marital Status:   Intimate Partner Violence:   . Fear of Current or Ex-Partner:   . Emotionally Abused:   Marland Kitchen Physically Abused:   . Sexually Abused:     ALLERGIES:    Allergies  Allergen Reactions  . Enalapril Other (See Comments)    AKI  . Chlorhexidine Gluconate Hives  . Ibuprofen Other (See Comments)    Pt not able to take ibuprofen due to Alport's syndrome    CURRENT MEDICATIONS:    Current Outpatient Medications  Medication Sig Dispense Refill  . acetaminophen (TYLENOL) 500 MG tablet Take 500 mg by mouth every 6 (six) hours as needed for moderate pain  or headache.    Marland Kitchen amLODipine (NORVASC) 10 MG tablet Take 1 tablet (10 mg total) by mouth daily. 90 tablet 1  . carvedilol (COREG) 25 MG tablet Take 1 tablet (25 mg total) by mouth 2 (two) times daily with a meal. 60 tablet 2  . etonogestrel (NEXPLANON) 68 MG IMPL implant 68 mg by Subdermal route once. Jan 2018    . furosemide (LASIX) 40 MG tablet Take 40 mg by mouth 2 (two) times a week. Tuesday and Thursday    . sevelamer carbonate (RENVELA) 800 MG tablet Take 800 mg by mouth 3 (three) times daily with meals.    . hydrALAZINE (APRESOLINE) 25 MG tablet Take 3 tablets (75 mg total) by mouth 3 (three) times daily. 270 tablet 3  . isosorbide mononitrate (IMDUR) 30 MG 24 hr tablet Take 1 tablet (30 mg total) by mouth daily. 90 tablet 3   No current facility-administered  medications for this visit.    REVIEW OF SYSTEMS:   [X]  denotes positive finding, [ ]  denotes negative finding Cardiac  Comments:  Chest pain or chest pressure:    Shortness of breath upon exertion:    Short of breath when lying flat:    Irregular heart rhythm:        Vascular    Pain in calf, thigh, or hip brought on by ambulation:    Pain in feet at night that wakes you up from your sleep:     Blood clot in your veins:    Leg swelling:         Pulmonary    Oxygen at home:    Productive cough:     Wheezing:         Neurologic    Sudden weakness in arms or legs:     Sudden numbness in arms or legs:     Sudden onset of difficulty speaking or slurred speech:    Temporary loss of vision in one eye:     Problems with dizziness:         Gastrointestinal    Blood in stool:      Vomited blood:         Genitourinary    Burning when urinating:     Blood in urine:        Psychiatric    Major depression:         Hematologic    Bleeding problems:    Problems with blood clotting too easily:        Skin    Rashes or ulcers:        Constitutional    Fever or chills:     PHYSICAL EXAM:   Vitals:   01/24/20 0908 01/24/20 0910  BP: (!) 175/128 (!) 174/124  Pulse: 98   Resp: 20   Temp: 97.7 F (36.5 C)   SpO2: 99%   Weight: 128 lb (58.1 kg)   Height: 5\' 4"  (1.626 m)     GENERAL: The patient is a well-nourished female, in no acute distress. The vital signs are documented above. CARDIAC: There is a regular rate and rhythm.  VASCULAR: Palpable radial pulses PULMONARY: Nonlabored respirations MUSCULOSKELETAL: There are no major deformities or cyanosis. NEUROLOGIC: No focal weakness or paresthesias are detected. SKIN: There are no ulcers or rashes noted. PSYCHIATRIC: The patient has a normal affect.  STUDIES:   I have reviewed the following:  ARTERIAL: Right Pre-Dialysis Findings:  +-----------------------+----------+--------------------+---------+--------  +   Location  PSV (cm/s)Intralum. Diam. (cm)Waveform  Comments  +-----------------------+----------+--------------------+---------+--------  +  Brachial Antecub. fossa50    0.47        triphasic        +-----------------------+----------+--------------------+---------+--------  +  Radial Art at Wrist  34    0.14        triphasic        +-----------------------+----------+--------------------+---------+--------  +  Ulnar Art at Wrist   46    0.24        triphasic        +-----------------------+----------+--------------------+---------+--------  +      Left Pre-Dialysis Findings:  +-----------------------+----------+--------------------+---------+--------  +  Location        PSV (cm/s)Intralum. Diam. (cm)Waveform  Comments  +-----------------------+----------+--------------------+---------+--------  +  Brachial Antecub. fossa53    0.39        triphasic        +-----------------------+----------+--------------------+---------+--------  +  Radial Art at Wrist  50    0.19        triphasic        +-----------------------+----------+--------------------+---------+--------  +  Ulnar Art at Wrist   0     0.18        triphasic        +-----------------------+----------+--------------------+---------+--------  +    +-----------------+-------------+----------+-----------------------------+  Right Cephalic  Diameter (cm)Depth (cm)     Findings        +-----------------+-------------+----------+-----------------------------+  Shoulder       0.27     0.09                   +-----------------+-------------+----------+-----------------------------+  Prox upper arm    0.15     0.37                     +-----------------+-------------+----------+-----------------------------+  Mid upper arm    0.29     0.38                   +-----------------+-------------+----------+-----------------------------+  Dist upper arm    0.23     0.47  branching and thickened walls  +-----------------+-------------+----------+-----------------------------+  Antecubital fossa  0.27     0.12                   +-----------------+-------------+----------+-----------------------------+  Prox forearm     0.16     0.35       branching       +-----------------+-------------+----------+-----------------------------+  Mid forearm     0.13     0.31                   +-----------------+-------------+----------+-----------------------------+  Dist forearm     0.12     0.15                   +-----------------+-------------+----------+-----------------------------+  Wrist        0.15     0.23                   +-----------------+-------------+----------+-----------------------------+   +-----------------+-------------+----------+--------------+  Right Basilic  Diameter (cm)Depth (cm)  Findings    +-----------------+-------------+----------+--------------+  Prox upper arm    0.30                 +-----------------+-------------+----------+--------------+  Mid upper arm    0.17                 +-----------------+-------------+----------+--------------+  Dist upper arm    0.20         branching    +-----------------+-------------+----------+--------------+  Antecubital fossa  0.13                 +-----------------+-------------+----------+--------------+  Prox forearm     0.11                  +-----------------+-------------+----------+--------------+  Mid forearm     0.11                 +-----------------+-------------+----------+--------------+  Distal forearm              not visualized  +-----------------+-------------+----------+--------------+  Elbow                  not visualized  +-----------------+-------------+----------+--------------+  Wrist                  not visualized  +-----------------+-------------+----------+--------------+   +-----------------+-------------+----------+--------------+   +-----------------+-------------+----------+--------------+  Left Cephalic  Diameter (cm)Depth (cm)  Findings    +-----------------+-------------+----------+--------------+  Shoulder       0.28     0.53           +-----------------+-------------+----------+--------------+  Prox upper arm    0.23     0.39           +-----------------+-------------+----------+--------------+  Mid upper arm    0.20     0.36           +-----------------+-------------+----------+--------------+  Dist upper arm    0.26     0.32           +-----------------+-------------+----------+--------------+  Antecubital fossa  0.38     0.14   branching    +-----------------+-------------+----------+--------------+  Prox forearm     0.14                 +-----------------+-------------+----------+--------------+  Mid forearm     0.16                 +-----------------+-------------+----------+--------------+  Dist forearm     0.16                 +-----------------+-------------+----------+--------------+  Wrist                  not visualized  +-----------------+-------------+----------+--------------+    +-----------------+-------------+----------+--------------+  Left Basilic   Diameter (cm)Depth (cm)  Findings    +-----------------+-------------+----------+--------------+  Prox upper arm    0.29                 +-----------------+-------------+----------+--------------+  Mid upper arm    0.29                 +-----------------+-------------+----------+--------------+  Dist upper arm    0.31                 +-----------------+-------------+----------+--------------+  Antecubital fossa  0.27                 +-----------------+-------------+----------+--------------+  Prox forearm     0.19                 +-----------------+-------------+----------+--------------+  Mid forearm               not visualized  +-----------------+-------------+----------+--------------+  Distal forearm              not visualized  +-----------------+-------------+----------+--------------+  Elbow                  not visualized  +-----------------+-------------+----------+--------------+  Wrist                  not visualized  +-----------------+-------------+----------+--------------+  ASSESSMENT and PLAN   End-stage  renal disease: The patient is right-handed.  She has relatively small surface veins.  I would like to do everything possible to try to get a fistula placed.  I think her best option is a basilic vein on the left.  I discussed doing this in a staged approach.  The first date is been scheduled for Thursday, June 3.  We discussed the risk of not maturity as well as the risk of steal syndrome and she wants to proceed.   Leia Alf, MD, FACS [facility] [phone] Pager [phone]

## 2020-01-25 ENCOUNTER — Ambulatory Visit (HOSPITAL_BASED_OUTPATIENT_CLINIC_OR_DEPARTMENT_OTHER)
Admission: RE | Admit: 2020-01-25 | Discharge: 2020-01-25 | Disposition: A | Discharge status: Home / Self Care | Payer: Medicare Other | Source: Ambulatory Visit | Attending: Internal Medicine | Admitting: Internal Medicine

## 2020-01-25 ENCOUNTER — Encounter (HOSPITAL_COMMUNITY): Payer: Self-pay | Admitting: Internal Medicine

## 2020-01-25 ENCOUNTER — Ambulatory Visit (HOSPITAL_BASED_OUTPATIENT_CLINIC_OR_DEPARTMENT_OTHER)
Admission: RE | Admit: 2020-01-25 | Discharge: 2020-01-25 | Disposition: A | Discharge status: Home / Self Care | Payer: Medicare Other | Source: Ambulatory Visit | Attending: Adult Health | Admitting: Adult Health

## 2020-01-25 VITALS — BP 160/110 | HR 94 | Wt 125.0 lb

## 2020-01-25 DIAGNOSIS — I3139 Other pericardial effusion (noninflammatory): Secondary | ICD-10-CM

## 2020-01-25 DIAGNOSIS — I5022 Chronic systolic (congestive) heart failure: Secondary | ICD-10-CM

## 2020-01-25 DIAGNOSIS — N186 End stage renal disease: Secondary | ICD-10-CM | POA: Diagnosis not present

## 2020-01-25 DIAGNOSIS — E049 Nontoxic goiter, unspecified: Secondary | ICD-10-CM | POA: Diagnosis not present

## 2020-01-25 DIAGNOSIS — I313 Pericardial effusion (noninflammatory): Secondary | ICD-10-CM | POA: Diagnosis not present

## 2020-01-25 DIAGNOSIS — I10 Essential (primary) hypertension: Secondary | ICD-10-CM

## 2020-01-25 DIAGNOSIS — Z992 Dependence on renal dialysis: Secondary | ICD-10-CM

## 2020-01-25 LAB — COMPREHENSIVE METABOLIC PANEL
ALT: 15 U/L (ref 0–44)
AST: 22 U/L (ref 15–41)
Albumin: 3.7 g/dL (ref 3.5–5.0)
Alkaline Phosphatase: 87 U/L (ref 38–126)
Anion gap: 14 (ref 5–15)
BUN: 37 mg/dL — ABNORMAL HIGH (ref 6–20)
CO2: 27 mmol/L (ref 22–32)
Calcium: 10.4 mg/dL — ABNORMAL HIGH (ref 8.9–10.3)
Chloride: 100 mmol/L (ref 98–111)
Creatinine, Ser: 9.03 mg/dL — ABNORMAL HIGH (ref 0.44–1.00)
GFR calc Af Amer: 7 mL/min — ABNORMAL LOW (ref >60)
GFR calc non Af Amer: 6 mL/min — ABNORMAL LOW (ref >60)
Glucose, Bld: 91 mg/dL (ref 70–99)
Potassium: 4.5 mmol/L (ref 3.5–5.1)
Sodium: 141 mmol/L (ref 135–145)
Total Bilirubin: 1.1 mg/dL (ref 0.3–1.2)
Total Protein: 7.3 g/dL (ref 6.5–8.1)

## 2020-01-25 LAB — CBC
HCT: 34.6 % — ABNORMAL LOW (ref 36.0–46.0)
Hemoglobin: 10.4 g/dL — ABNORMAL LOW (ref 12.0–15.0)
MCH: 29.7 pg (ref 26.0–34.0)
MCHC: 30.1 g/dL (ref 30.0–36.0)
MCV: 98.9 fL (ref 80.0–100.0)
Platelets: 109 10*3/uL — ABNORMAL LOW (ref 150–400)
RBC: 3.5 MIL/uL — ABNORMAL LOW (ref 3.87–5.11)
RDW: 16.4 % — ABNORMAL HIGH (ref 11.5–15.5)
WBC: 5 10*3/uL (ref 4.0–10.5)
nRBC: 0 % (ref 0.0–0.2)

## 2020-01-25 LAB — TSH: TSH: 3.786 u[IU]/mL (ref 0.350–4.500)

## 2020-01-25 LAB — T4, FREE: Free T4: 1.06 ng/dL (ref 0.61–1.12)

## 2020-01-25 MED ORDER — ENTRESTO 24-26 MG PO TABS: 1.0000 | ORAL_TABLET | Freq: Two times a day (BID) | ORAL | 6 refills | Status: DC

## 2020-01-25 NOTE — Progress Notes (Addendum)
ADVANCED HF CLINIC  PCP: Dr Paul Dykes Nephrology: Dr Justin Mend  Primary HF Cardiologist: Dr Haroldine Laws.  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 She is on HD MWF and followed by Dr. Justin Mend. 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 Now w/ subsequent secondary hyperparathyroidism, needing totalparathyroidectomy.   Of note, she was diagnosed w/ Covid 19 infection 08/2019 and hospitalized, treated w/decadron and remdesivir. Had 2D echo 08/23/19 showing moderate pericardial effusionbut no tamponade physiology,moderate MR/TR and severely reduced LVEF 25-30%. Hs troponin level was mildly elevated but flat trend, 72>>79. ? Myocarditis. Unable to get cMRi due to renal function. HF was treated w/ lasix + HD for volume management + coreg and hydralazine.   She was seen in general cardiology clinic 10/06/19 by Dr. Johnsie Cancel for preoperative clearance prior to plannedparathyroidectomy. Echo was repeated and demonstrated increased size of pericardial effusion, w/ progression from moderate to severe. No evidence of tamponade. LVEF 30-35%. RV normal size w/ hyperdynamic systolic function.   Admitted 10/07/19 after RHC/LHC for planned pericardial window. Cath showed normal coronaries, mild pulmonary HTN, EF 35%, and large pericardial effusion. On 10/08/19 had pericardial window with 700 cc removed. Cytology, ANA, ANCA, RF, congo red stain negative. Cytology with reactive mesothelial cells.   Today she returns for HF follow up. Says she feels ok. Denies SOB, orthopnea or PND. Can go to the store without a problem. Was having severe HAs with Bidil. Says the PA at dialysis changed her meds and it got better. BP still very high running 140-200. No longer being followed at [facility]. Planning AVF placement 02/10/20. Has had sore throat and cough for a week. With swelling  in her neck.   Echo today EF 25-30% RV moderately down. Severe MR/TR Small effusion     ROS: All systems negative except as listed in HPI, PMH and Problem List.  SH:  Social History   Socioeconomic History  . Marital status: Married    Spouse name: Not on file  . Number of children: Not on file  . Years of education: Not on file  . Highest education level: Not on file  Occupational History  . Not on file  Tobacco Use  . Smoking status: Never Smoker  . Smokeless tobacco: Never Used  Substance and Sexual Activity  . Alcohol use: No  . Drug use: No  . Sexual activity: Not Currently    Birth control/protection: Implant  Other Topics Concern  . Not on file  Social History Narrative   Lives with Mom, Sister, & 2 Brothers. No pets at home. No one smokes.   Social Determinants of Health   Financial Resource Strain:   . Difficulty of Paying Living Expenses:   Food Insecurity:   . Worried About Charity fundraiser in the Last Year:   . Arboriculturist in the Last Year:   Transportation Needs:   . Film/video editor (Medical):   Marland Kitchen Lack of Transportation (Non-Medical):   Physical Activity:   . Days of Exercise per Week:   . Minutes of Exercise per Session:   Stress:   . Feeling of Stress :   Social Connections:   . Frequency of Communication with Friends and Family:   . Frequency of Social Gatherings with Friends and Family:   . Attends Religious Services:   . Active Member  of Clubs or Organizations:   . Attends Archivist Meetings:   Marland Kitchen Marital Status:   Intimate Partner Violence:   . Fear of Current or Ex-Partner:   . Emotionally Abused:   Marland Kitchen Physically Abused:   . Sexually Abused:     FH:  Family History  Problem Relation Age of Onset  . Alport syndrome Brother     Past Medical History:  Diagnosis Date  . Alport syndrome   . Alport syndrome   . Anemia   . Constipation 12/18/2013  . Depression 07/28/2013  . ESRD (end stage renal disease)  ([place])     Transplant failed Hemo MWF at [facility].  Marland Kitchen History of blood transfusion   . Hypertension   . Influenza B   . S/P wisdom tooth extraction 06/22/2016   Right mandibular dental pain  . Sinus tachycardia     Current Outpatient Medications  Medication Sig Dispense Refill  . acetaminophen (TYLENOL) 500 MG tablet Take 500 mg by mouth every 6 (six) hours as needed for moderate pain or headache.    Marland Kitchen amLODipine (NORVASC) 10 MG tablet Take 1 tablet (10 mg total) by mouth daily. 90 tablet 1  . carvedilol (COREG) 25 MG tablet Take 1 tablet (25 mg total) by mouth 2 (two) times daily with a meal. 60 tablet 2  . Cinacalcet HCl (SENSIPAR PO) Take by mouth.    . etonogestrel (NEXPLANON) 68 MG IMPL implant 68 mg by Subdermal route once. Jan 2018    . furosemide (LASIX) 40 MG tablet Take 40 mg by mouth 2 (two) times a week. Tuesday and Thursday    . hydrALAZINE (APRESOLINE) 25 MG tablet Take 3 tablets (75 mg total) by mouth 3 (three) times daily. 270 tablet 3  . isosorbide mononitrate (IMDUR) 30 MG 24 hr tablet Take 1 tablet (30 mg total) by mouth daily. 90 tablet 3  . sevelamer carbonate (RENVELA) 800 MG tablet Take 800 mg by mouth 3 (three) times daily with meals.     No current facility-administered medications for this encounter.    Vitals:   01/25/20 1405  BP: (!) 160/110  Pulse: 94  SpO2: 98%  Weight: 56.7 kg (125 lb)    PHYSICAL EXAM:  General:  Weak appearing. No resp difficulty. hoarse HEENT: normal Neck: supple. JVP 9-10 _ prominent cv waves  Carotids 2+ bilat; no bruits. + R-sided neck swelling/lump. Tender to touch Cor: PMI nondisplaced. Regular rate & rhythm. No rubs, gallops or murmurs. R-sided perm cath  Lungs: clear Abdomen: soft, nontender, nondistended. No hepatosplenomegaly. No bruits or masses. Good bowel sounds. Extremities: no cyanosis, clubbing, rash, 1+ edema Neuro: alert & orientedx3, cranial nerves grossly intact. moves all 4  extremities w/o difficulty. Affect pleasant   ASSESSMENT & PLAN   1. Pericardial Effusion: large posterior effusion noted on echo 1/21, previously moderate on prior echo 12/20  - s/p pericardial window on 1/29. 800cc fluid.  Pericardial biopsy sent for amyloid (negative) . Cultures negative. Cytology negative -minimal effusion on echo today. Viewed personally    2. Chronic systolic HF: new diagnosed made 08/2019 while admitted for covid 19 infection. EF 25-30%. Hs troponin level at time of initial diganosis was mildly elevated but flat trend, 72>>79. ? Viral Myocarditis vs HTN. Unable to get cMRi due to renal function.  - Repeat Echo 1/21 EF 30-35%, global hypokinesis, mod LVH, RV normal size and hyperdynamic systolic function - Cath no CAD - NYHA I-II - GDMT limited  by CKD - on carvedilol 25 bid and Bidil 2 TID - BP markedly elevated. No issues with hyperkalemia.  I discussed with Dr. Justin Mend. Will start Entresto 24/26 bid carefully.  - If EF not improving with GDMT and management of HTN may need to consider heart/kidney transplant eval but non-compliance and loss of previously kidney transplant are major barriers.    3. ESRD: chronic renal failure since age 83 secondary to Alport Syndrome. 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 She is on HD MWF and followed by Dr. Justin Mend.  - management per nephrology - Pending AVF placement   4. HTN - BP markedly elevated.  - No issues with hyperkalemia.  I discussed with Dr. Justin Mend. Will start Entresto 24/26 bid carefully.    5. Neck swelling - ? Infectious or thyroid.  - Will check neck u/s and thyroid labs - D/w Dr. Justin Mend who will also f/u  Glori Bickers, MD  2:28 PM

## 2020-01-25 NOTE — Progress Notes (Signed)
  Echocardiogram 2D Echocardiogram has been performed.  Geoffery Lyons Swaim 01/25/2020, 1:50 PM

## 2020-01-25 NOTE — Patient Instructions (Signed)
Start Entresto 24/26 mg Twice daily   Labs done today, your results will be available in MyChart, we will contact you for abnormal readings.  Ultrasound of your neck  Your physician recommends that you schedule a follow-up appointment in: 4 months  If you have any questions or concerns before your next appointment please send [place] a message through [place] or call our office at [phone].  At the [facility], you and your health needs are our priority. As part of our continuing mission to provide you with exceptional heart care, we have created designated Provider Care Teams. These Care Teams include your primary Cardiologist (physician) and Advanced Practice Providers (APPs- Physician Assistants and Nurse Practitioners) who all work together to provide you with the care you need, when you need it.   You may see any of the following providers on your designated Care Team at your next follow up: Marland Kitchen Dr Glori Bickers . Dr Loralie Champagne . Darrick Grinder, NP . Lyda Jester, PA . Audry Riles, PharmD   Please be sure to bring in all your medications bottles to every appointment.

## 2020-01-26 ENCOUNTER — Telehealth: Payer: Self-pay | Admitting: *Deleted

## 2020-01-26 ENCOUNTER — Other Ambulatory Visit: Payer: Self-pay | Admitting: Physician Assistant

## 2020-01-26 LAB — T3, FREE: T3, Free: 2.5 pg/mL (ref 2.0–4.4)

## 2020-01-26 NOTE — Telephone Encounter (Signed)
   [facility] Pre-operative Risk Assessment    HEARTCARE STAFF: - Please ensure there is not already an duplicate clearance open for this procedure - Under Visit Info/Reason for Call, type in Other and utilize the format Clearance MM/DD/YY or Clearance TBD  Request for surgical clearance:  1. What type of surgery is being performed? TOTAL PARATHYROIDECTOMY WITH AUTOTRANSPLANT TO THE LEFT FOREARM   2. When is this surgery scheduled? TBD   3. What type of clearance is required (medical clearance vs. Pharmacy clearance to hold med vs. Both)? MEDICAL  4. Are there any medications that need to be held prior to surgery and how long? NONE LISTED   5. Practice name and name of physician performing surgery? [facility]; DR. Sherren Mocha GERKIN   6. What is the office phone number? [phone]   7.   What is the office fax number?  [place]: Mammie Lorenzo, LPN  8.   Anesthesia type (None, local, MAC, general) ? GENERAL   Julaine Hua 01/26/2020, 5:50 PM  _________________________________________________________________   (provider comments below)

## 2020-02-04 NOTE — Telephone Encounter (Signed)
   Dr. 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 She was doing fairly well from a heart failure standpoint at that visit, though blood pressures were quite elevated and she noted neck swelling, sore throat, and cough. You ordered an ultrasound of her neck which is scheduled for 02/17/20. We received a preop request for upcoming total parathyroidectomy with autotransplant to the left forearm.   1. Does the patient need to complete the neck US prior to this procedure? 2. Can you comment on patients preoperative status?  Please route your response back to P CV DIV PREOP?  Thank you,  Abigail Butts, PA-C 02/04/20; 10:26 AM

## 2020-02-08 ENCOUNTER — Other Ambulatory Visit (HOSPITAL_COMMUNITY)
Admission: RE | Admit: 2020-02-08 | Discharge: 2020-02-08 | Disposition: A | Discharge status: Home / Self Care | Payer: Medicare Other | Source: Ambulatory Visit | Attending: Surgery | Admitting: Surgery

## 2020-02-08 DIAGNOSIS — Z20822 Contact with and (suspected) exposure to covid-19: Secondary | ICD-10-CM | POA: Insufficient documentation

## 2020-02-08 DIAGNOSIS — Z01812 Encounter for preprocedural laboratory examination: Secondary | ICD-10-CM | POA: Insufficient documentation

## 2020-02-08 NOTE — Telephone Encounter (Signed)
I have forwarded Dr. Clayborne Dana recommendation to the requesting provider's office

## 2020-02-08 NOTE — Telephone Encounter (Signed)
She is cleared from a cardiology perspective for surgery but I will defer to the Renal team and her transplant team regarding clearance from a general medical perspective (including her neck mass). Can we move up the u/s?

## 2020-02-09 ENCOUNTER — Encounter (HOSPITAL_COMMUNITY): Payer: Self-pay | Admitting: Surgery

## 2020-02-09 LAB — SARS CORONAVIRUS 2 (TAT 6-24 HRS): SARS Coronavirus 2: NEGATIVE

## 2020-02-09 NOTE — Progress Notes (Signed)
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 She is on HD MWF and followed by Dr. Justin Mend. 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 Now w/ subsequent secondary hyperparathyroidism. She is planned for totalparathyroidectomy in the near future.   Diagnosed w/ Covid19 08/2019 and hospitalized, treated w/decadron and remdesivir. Had 2D echo 08/23/19 showing moderate pericardial effusionbut no tamponade physiology,moderate MR/TR and severely reduced LVEF 25-30%. Hs troponin level was mildly elevated but flat trend, 72>>79. ? Myocarditis. Unable to get cMRi due to renal function. HF was treated w/ lasix + HD for volume management + coreg and hydralazine.   She was seen in general cardiology clinic1/27/21by Dr. Johnsie Cancel for preoperative clearance prior to plannedparathyroidectomy. Echo was repeated and demonstrated increased size of pericardial effusion, w/ progression from moderate to severe. No evidence of tamponade. LVEF 30-35%. RV normal size w/ hyperdynamic systolic function.  Admitted 10/07/19 after RHC/LHC for planned pericardial window. Cath showed normal coronaries, mild pulmonary HTN, EF 35%, and large pericardial effusion. On 10/08/19 had pericardial window with 700 cc removed. Cytology, ANA, ANCA, RF, congo red stain negative. Cytology with reactive mesothelial cells.  Pt last seen by Dr. Haroldine Laws 01/25/20. Per note, "Says she feels ok. Denies SOB, orthopnea or PND. Can go to the store without a problem. Was having severe HAs with Bidil. Says the PA at dialysis changed her meds and it got better. BP still very high running 140-200. No longer being followed at [facility]. Planning AVF placement 02/10/20. Has had sore throat and cough for a week. With swelling in her neck." Entresto was added for BP control. Dr. Haroldine Laws ordered US to eval neck swelling, this has not been done  yet. Pt also had repeat echo at that visit. Results are not populated under echo procedure, however Dr. Clayborne Dana note states "Echo today EF 25-30% RV moderately down. Severe MR/TR Small effusion." Multiple calls and voicemail left with echo lab regarding getting read/results into Epic with no response.   Will need DOS labs and eval.   EKG 10/19/19: Sinus tachycardia. Rate 106. Possible Left atrial enlargement. No significant change since last tracing.   Wynonia Musty [facility] Stay [facility]/Anesthesiology Phone [phone] 02/09/2020 3:58 PM

## 2020-02-09 NOTE — Anesthesia Preprocedure Evaluation (Addendum)
Anesthesia Evaluation  Patient identified by MRN, date of birth, ID band Patient awake    Reviewed: Allergy & Precautions, H&P , NPO status , Patient's Chart, lab work & pertinent test results  Airway Mallampati: II   Neck ROM: full    Dental   Pulmonary neg pulmonary ROS,    breath sounds clear to auscultation       Cardiovascular hypertension,  Rhythm:regular Rate:Tachycardia  EF approximately 30%. Moderate MR and TR. Pleural effusion requiring window Jan 2021.  Followed by [facility].   Neuro/Psych PSYCHIATRIC DISORDERS Depression    GI/Hepatic   Endo/Other    Renal/GU ESRF and DialysisRenal diseaseH/o Alport syndrome     Musculoskeletal   Abdominal   Peds  Hematology   Anesthesia Other Findings   Reproductive/Obstetrics                            Anesthesia Physical Anesthesia Plan  ASA: III  Anesthesia Plan: MAC   Post-op Pain Management:    Induction: Intravenous  PONV Risk Score and Plan: 2 and Ondansetron, Midazolam, Treatment may vary due to age or medical condition and Propofol infusion  Airway Management Planned: Simple Face Mask  Additional Equipment:   Intra-op Plan:   Post-operative Plan:   Informed Consent: I have reviewed the patients History and Physical, chart, labs and discussed the procedure including the risks, benefits and alternatives for the proposed anesthesia with the patient or authorized representative who has indicated his/her understanding and acceptance.       Plan Discussed with: CRNA, Anesthesiologist and Surgeon  Anesthesia Plan Comments: (PAT note by Karoline Caldwell, PA-C: Hx of chronic renal failure since age 40 secondary to Alport Syndrome. She is on HD MWF and followed by Dr. Justin Mend. 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 Now w/ subsequent secondary hyperparathyroidism. She is planned for  totalparathyroidectomy in the near future.   Diagnosed w/ Covid19 08/2019 and hospitalized, treated w/decadron and remdesivir. Had 2D echo 08/23/19 showing moderate pericardial effusionbut no tamponade physiology,moderate MR/TR and severely reduced LVEF 25-30%. Hs troponin level was mildly elevated but flat trend, 72>>79. ? Myocarditis. Unable to get cMRi due to renal function. HF was treated w/ lasix + HD for volume management + coreg and hydralazine.   She was seen in general cardiology clinic1/27/21by Dr. Johnsie Cancel for preoperative clearance prior to plannedparathyroidectomy. Echo was repeated and demonstrated increased size of pericardial effusion, w/ progression from moderate to severe. No evidence of tamponade. LVEF 30-35%. RV normal size w/ hyperdynamic systolic function.  Admitted 10/07/19 after RHC/LHC for planned pericardial window. Cath showed normal coronaries, mild pulmonary HTN, EF 35%, and large pericardial effusion. On 10/08/19 had pericardial window with 700 cc removed. Cytology, ANA, ANCA, RF, congo red stain negative. Cytology with reactive mesothelial cells.  Pt last seen by Dr. Haroldine Laws 01/25/20. Per note, "Says she feels ok. Denies SOB, orthopnea or PND. Can go to the store without a problem. Was having severe HAs with Bidil. Says the PA at dialysis changed her meds and it got better. BP still very high running 140-200. No longer being followed at [facility]. Planning AVF placement 02/10/20. Has had sore throat and cough for a week. With swelling in her neck." Entresto was added for BP control. Dr. Haroldine Laws ordered US to eval neck swelling, this has not been done yet. Pt also had repeat echo at that visit. Results are not populated under echo procedure, however  Dr. Clayborne Dana note states "Echo today EF 25-30% RV moderately down. Severe MR/TR Small effusion." Multiple calls and voicemail left with echo lab regarding getting read/results into Epic with no  response.   Will need DOS labs and eval.   EKG 10/19/19: Sinus tachycardia. Rate 106. Possible Left atrial enlargement. No significant change since last tracing. )       Anesthesia Quick Evaluation

## 2020-02-09 NOTE — Progress Notes (Addendum)
Patient denies shortness of breath, fever, or chest pain.  Patient states slight cough but covid test was negative.  PCP - Dr Paul Dykes [place] Cardiologist - Dr Haroldine Laws Nephrology:  Dr Justin Mend Ophthalmology - Dr Dwana Melena  Chest x-ray - 10/21/19 (2V) EKG - 10/19/19 Stress Test - n/a ECHO - 01/25/20 Cardiac Cath - 10/07/19  Anesthesia review: Yes  STOP now taking any Aspirin (unless otherwise instructed by your surgeon), Aleve, Naproxen, Ibuprofen, Motrin, Advil, Goody's, BC's, all herbal medications, fish oil, and all vitamins.   Coronavirus Screening Covid test on 02/08/20 was negative.  Patient verbalized understanding of instructions that were given via phone.

## 2020-02-10 ENCOUNTER — Other Ambulatory Visit (HOSPITAL_COMMUNITY): Payer: Medicare Other

## 2020-02-10 ENCOUNTER — Ambulatory Visit (HOSPITAL_COMMUNITY): Payer: Medicare Other | Admitting: Physician Assistant

## 2020-02-10 ENCOUNTER — Other Ambulatory Visit: Payer: Self-pay

## 2020-02-10 ENCOUNTER — Encounter (HOSPITAL_COMMUNITY): Payer: Self-pay | Admitting: Surgery

## 2020-02-10 ENCOUNTER — Ambulatory Visit (HOSPITAL_COMMUNITY)
Admission: RE | Admit: 2020-02-10 | Discharge: 2020-02-10 | Disposition: A | Discharge status: Home / Self Care | Payer: Medicare Other | Attending: Surgery | Admitting: Surgery

## 2020-02-10 ENCOUNTER — Encounter (HOSPITAL_COMMUNITY)
Admission: RE | Disposition: A | Discharge status: Home / Self Care | Payer: Self-pay | Source: Home / Self Care | Attending: Surgery

## 2020-02-10 DIAGNOSIS — Q8781 Alport syndrome: Secondary | ICD-10-CM | POA: Diagnosis not present

## 2020-02-10 DIAGNOSIS — N186 End stage renal disease: Secondary | ICD-10-CM | POA: Insufficient documentation

## 2020-02-10 DIAGNOSIS — Z8616 Personal history of COVID-19: Secondary | ICD-10-CM | POA: Insufficient documentation

## 2020-02-10 DIAGNOSIS — Z888 Allergy status to other drugs, medicaments and biological substances status: Secondary | ICD-10-CM | POA: Diagnosis not present

## 2020-02-10 DIAGNOSIS — Z79899 Other long term (current) drug therapy: Secondary | ICD-10-CM | POA: Diagnosis not present

## 2020-02-10 DIAGNOSIS — I120 Hypertensive chronic kidney disease with stage 5 chronic kidney disease or end stage renal disease: Secondary | ICD-10-CM | POA: Insufficient documentation

## 2020-02-10 DIAGNOSIS — Z992 Dependence on renal dialysis: Secondary | ICD-10-CM | POA: Insufficient documentation

## 2020-02-10 DIAGNOSIS — Z886 Allergy status to analgesic agent status: Secondary | ICD-10-CM | POA: Diagnosis not present

## 2020-02-10 DIAGNOSIS — Z793 Long term (current) use of hormonal contraceptives: Secondary | ICD-10-CM | POA: Diagnosis not present

## 2020-02-10 DIAGNOSIS — N185 Chronic kidney disease, stage 5: Secondary | ICD-10-CM

## 2020-02-10 HISTORY — DX: Unspecified hearing loss, unspecified ear: H91.90

## 2020-02-10 HISTORY — PX: AV FISTULA PLACEMENT: SHX1204

## 2020-02-10 HISTORY — DX: Presence of spectacles and contact lenses: Z97.3

## 2020-02-10 LAB — POCT I-STAT, CHEM 8
BUN: 28 mg/dL — ABNORMAL HIGH (ref 6–20)
Calcium, Ion: 1.12 mmol/L — ABNORMAL LOW (ref 1.15–1.40)
Chloride: 97 mmol/L — ABNORMAL LOW (ref 98–111)
Creatinine, Ser: 7.5 mg/dL — ABNORMAL HIGH (ref 0.44–1.00)
Glucose, Bld: 90 mg/dL (ref 70–99)
HCT: 29 % — ABNORMAL LOW (ref 36.0–46.0)
Hemoglobin: 9.9 g/dL — ABNORMAL LOW (ref 12.0–15.0)
Potassium: 4.3 mmol/L (ref 3.5–5.1)
Sodium: 136 mmol/L (ref 135–145)
TCO2: 25 mmol/L (ref 22–32)

## 2020-02-10 LAB — POCT PREGNANCY, URINE: Preg Test, Ur: NEGATIVE

## 2020-02-10 SURGERY — ARTERIOVENOUS (AV) FISTULA CREATION
Anesthesia: Monitor Anesthesia Care | Site: Arm Lower | Laterality: Left

## 2020-02-10 MED ORDER — CEFAZOLIN SODIUM-DEXTROSE 2-4 GM/100ML-% IV SOLN
2.0000 g | INTRAVENOUS | Status: AC
  Administered 2020-02-10: 2 g via INTRAVENOUS
  Filled 2020-02-10: qty 100

## 2020-02-10 MED ORDER — LIDOCAINE HCL (CARDIAC) PF 100 MG/5ML IV SOSY
PREFILLED_SYRINGE | INTRAVENOUS | Status: DC | PRN
Start: 2020-02-10 — End: 2020-02-10
  Administered 2020-02-10: 20 mg via INTRAVENOUS

## 2020-02-10 MED ORDER — OXYCODONE HCL 5 MG PO TABS: 5.0000 mg | ORAL_TABLET | Freq: Once | ORAL | Status: DC | PRN

## 2020-02-10 MED ORDER — PROPOFOL 500 MG/50ML IV EMUL
INTRAVENOUS | Status: DC | PRN
  Administered 2020-02-10: 50 ug/kg/min via INTRAVENOUS

## 2020-02-10 MED ORDER — HEMOSTATIC AGENTS (NO CHARGE) OPTIME
TOPICAL | Status: DC | PRN
  Administered 2020-02-10: 1 via TOPICAL

## 2020-02-10 MED ORDER — DEXMEDETOMIDINE HCL IN NACL 200 MCG/50ML IV SOLN
INTRAVENOUS | Status: AC
  Filled 2020-02-10: qty 50

## 2020-02-10 MED ORDER — OXYCODONE HCL 5 MG/5ML PO SOLN: 5.0000 mg | Freq: Once | ORAL | Status: DC | PRN

## 2020-02-10 MED ORDER — HYDRALAZINE HCL 50 MG PO TABS
75.0000 mg | ORAL_TABLET | Freq: Once | ORAL | Status: AC
  Administered 2020-02-10: 75 mg via ORAL
  Filled 2020-02-10 (×2): qty 1

## 2020-02-10 MED ORDER — 0.9 % SODIUM CHLORIDE (POUR BTL) OPTIME
TOPICAL | Status: DC | PRN
  Administered 2020-02-10: 1000 mL

## 2020-02-10 MED ORDER — FENTANYL CITRATE (PF) 250 MCG/5ML IJ SOLN
INTRAMUSCULAR | Status: AC
  Filled 2020-02-10: qty 5

## 2020-02-10 MED ORDER — SODIUM CHLORIDE 0.9 % IV SOLN: INTRAVENOUS | Status: DC

## 2020-02-10 MED ORDER — CARVEDILOL 12.5 MG PO TABS
25.0000 mg | ORAL_TABLET | Freq: Once | ORAL | Status: AC
  Administered 2020-02-10: 25 mg via ORAL
  Filled 2020-02-10: qty 2

## 2020-02-10 MED ORDER — ONDANSETRON HCL 4 MG/2ML IJ SOLN: 4.0000 mg | Freq: Four times a day (QID) | INTRAMUSCULAR | Status: DC | PRN

## 2020-02-10 MED ORDER — OXYCODONE-ACETAMINOPHEN 5-325 MG PO TABS: 1.0000 | ORAL_TABLET | Freq: Four times a day (QID) | ORAL | 0 refills | Status: DC | PRN

## 2020-02-10 MED ORDER — FENTANYL CITRATE (PF) 100 MCG/2ML IJ SOLN
INTRAMUSCULAR | Status: DC | PRN
  Administered 2020-02-10: 50 ug via INTRAVENOUS

## 2020-02-10 MED ORDER — MIDAZOLAM HCL 5 MG/5ML IJ SOLN
INTRAMUSCULAR | Status: DC | PRN
  Administered 2020-02-10: 2 mg via INTRAVENOUS

## 2020-02-10 MED ORDER — ONDANSETRON HCL 4 MG/2ML IJ SOLN
INTRAMUSCULAR | Status: DC | PRN
  Administered 2020-02-10: 4 mg via INTRAVENOUS

## 2020-02-10 MED ORDER — MIDAZOLAM HCL 2 MG/2ML IJ SOLN
INTRAMUSCULAR | Status: AC
  Filled 2020-02-10: qty 2

## 2020-02-10 MED ORDER — SODIUM CHLORIDE 0.9 % IV SOLN
INTRAVENOUS | Status: AC
  Filled 2020-02-10: qty 1.2

## 2020-02-10 MED ORDER — LIDOCAINE 2% (20 MG/ML) 5 ML SYRINGE
INTRAMUSCULAR | Status: AC
  Filled 2020-02-10: qty 5

## 2020-02-10 MED ORDER — PROPOFOL 10 MG/ML IV BOLUS
INTRAVENOUS | Status: AC
  Filled 2020-02-10: qty 20

## 2020-02-10 MED ORDER — PHENYLEPHRINE HCL-NACL 10-0.9 MG/250ML-% IV SOLN
INTRAVENOUS | Status: AC
  Filled 2020-02-10: qty 500

## 2020-02-10 MED ORDER — LIDOCAINE-EPINEPHRINE (PF) 1 %-1:200000 IJ SOLN
INTRAMUSCULAR | Status: AC
  Filled 2020-02-10: qty 30

## 2020-02-10 MED ORDER — FENTANYL CITRATE (PF) 100 MCG/2ML IJ SOLN: 25.0000 ug | INTRAMUSCULAR | Status: DC | PRN

## 2020-02-10 MED ORDER — PROPOFOL 1000 MG/100ML IV EMUL
INTRAVENOUS | Status: AC
  Filled 2020-02-10: qty 100

## 2020-02-10 MED ORDER — LIDOCAINE-EPINEPHRINE (PF) 1 %-1:200000 IJ SOLN
INTRAMUSCULAR | Status: DC | PRN
  Administered 2020-02-10: 6 mL via INTRADERMAL

## 2020-02-10 MED ORDER — PROPOFOL 500 MG/50ML IV EMUL
INTRAVENOUS | Status: AC
  Filled 2020-02-10: qty 50

## 2020-02-10 MED ORDER — SODIUM CHLORIDE 0.9 % IV SOLN
INTRAVENOUS | Status: DC | PRN
  Administered 2020-02-10: 500 mL

## 2020-02-10 SURGICAL SUPPLY — 33 items
ADH SKN CLS APL DERMABOND .7 (GAUZE/BANDAGES/DRESSINGS) ×1
ARMBAND PINK RESTRICT EXTREMIT (MISCELLANEOUS) ×4 IMPLANT
CANISTER SUCT 3000ML PPV (MISCELLANEOUS) ×3 IMPLANT
CLIP VESOCCLUDE MED 6/CT (CLIP) ×3 IMPLANT
CLIP VESOCCLUDE SM WIDE 6/CT (CLIP) ×3 IMPLANT
COVER PROBE W GEL 5X96 (DRAPES) ×3 IMPLANT
COVER SURGICAL LIGHT HANDLE (MISCELLANEOUS) ×2 IMPLANT
COVER WAND RF STERILE (DRAPES) ×3 IMPLANT
DERMABOND ADVANCED (GAUZE/BANDAGES/DRESSINGS) ×2
DERMABOND ADVANCED .7 DNX12 (GAUZE/BANDAGES/DRESSINGS) ×1 IMPLANT
ELECT REM PT RETURN 9FT ADLT (ELECTROSURGICAL) ×3
ELECTRODE REM PT RTRN 9FT ADLT (ELECTROSURGICAL) ×1 IMPLANT
GLOVE BIOGEL PI IND STRL 7.5 (GLOVE) ×1 IMPLANT
GLOVE BIOGEL PI INDICATOR 7.5 (GLOVE) ×2
GLOVE SURG SS PI 7.5 STRL IVOR (GLOVE) ×3 IMPLANT
GOWN STRL REUS W/ TWL LRG LVL3 (GOWN DISPOSABLE) ×2 IMPLANT
GOWN STRL REUS W/ TWL XL LVL3 (GOWN DISPOSABLE) ×1 IMPLANT
GOWN STRL REUS W/TWL LRG LVL3 (GOWN DISPOSABLE) ×6
GOWN STRL REUS W/TWL XL LVL3 (GOWN DISPOSABLE) ×3
HEMOSTAT ARISTA ABSORB 3G PWDR (HEMOSTASIS) ×2 IMPLANT
HEMOSTAT SNOW SURGICEL 2X4 (HEMOSTASIS) IMPLANT
KIT BASIN OR (CUSTOM PROCEDURE TRAY) ×3 IMPLANT
KIT TURNOVER KIT B (KITS) ×3 IMPLANT
NS IRRIG 1000ML POUR BTL (IV SOLUTION) ×3 IMPLANT
PACK CV ACCESS (CUSTOM PROCEDURE TRAY) ×3 IMPLANT
PAD ARMBOARD 7.5X6 YLW CONV (MISCELLANEOUS) ×6 IMPLANT
SUT PROLENE 6 0 CC (SUTURE) ×3 IMPLANT
SUT VIC AB 3-0 SH 27 (SUTURE) ×3
SUT VIC AB 3-0 SH 27X BRD (SUTURE) ×1 IMPLANT
SUT VICRYL 4-0 PS2 18IN ABS (SUTURE) ×3 IMPLANT
TOWEL GREEN STERILE (TOWEL DISPOSABLE) ×3 IMPLANT
UNDERPAD 30X36 HEAVY ABSORB (UNDERPADS AND DIAPERS) ×3 IMPLANT
WATER STERILE IRR 1000ML POUR (IV SOLUTION) ×3 IMPLANT

## 2020-02-10 NOTE — Anesthesia Postprocedure Evaluation (Signed)
Anesthesia Post Note  Patient: Dominique Lawrence  Procedure(s) Performed: ARTERIOVENOUS (AV) FISTULA CREATION (Left Arm Lower)     Patient location during evaluation: PACU Anesthesia Type: MAC Level of consciousness: awake and alert Pain management: pain level controlled Vital Signs Assessment: post-procedure vital signs reviewed and stable Respiratory status: spontaneous breathing, nonlabored ventilation, respiratory function stable and patient connected to nasal cannula oxygen Cardiovascular status: stable and blood pressure returned to baseline Postop Assessment: no apparent nausea or vomiting Anesthetic complications: no    Last Vitals:  Vitals:   02/10/20 0959 02/10/20 1000  BP: (!) 133/97 (!) 132/98  Pulse: 80 81  Resp: (!) 34 20  Temp:    SpO2: 93% 93%    Last Pain:  Vitals:   02/10/20 0930  TempSrc:   PainSc: [place]

## 2020-02-10 NOTE — Interval H&P Note (Signed)
History and Physical Interval Note:  02/10/2020 7:31 AM  Dominique Lawrence  has presented today for surgery, with the diagnosis of END STAGE RENAL DISAESE.  The various methods of treatment have been discussed with the patient and family. After consideration of risks, benefits and other options for treatment, the patient has consented to  Procedure(s): ARTERIOVENOUS (AV) FISTULA CREATION (Left) as a surgical intervention.  The patient's history has been reviewed, patient examined, no change in status, stable for surgery.  I have reviewed the patient's chart and labs.  Questions were answered to the patient's satisfaction.     Annamarie Major

## 2020-02-10 NOTE — Transfer of Care (Signed)
Immediate Anesthesia Transfer of Care Note  Patient: Dominique Lawrence  Procedure(s) Performed: ARTERIOVENOUS (AV) FISTULA CREATION (Left Arm Lower)  Patient Location: PACU  Anesthesia Type:MAC  Level of Consciousness: drowsy and patient cooperative  Airway & Oxygen Therapy: Patient Spontanous Breathing and Patient connected to face mask oxygen  Post-op Assessment: Report given to RN and Post -op Vital signs reviewed and stable  Post vital signs: Reviewed and stable  Last Vitals:  Vitals Value Taken Time  BP 134/95 02/10/20 0857  Temp    Pulse 79 02/10/20 0900  Resp 31 02/10/20 0900  SpO2 100 % 02/10/20 0900  Vitals shown include unvalidated device data.  Last Pain:  Vitals:   02/10/20 0642  TempSrc:   PainSc: 0-No pain         Complications: No apparent anesthesia complications

## 2020-02-10 NOTE — Op Note (Signed)
    Patient name: Dominique Lawrence MRN: [id number] DOB: 2000/12/15 Sex: female  02/10/2020 Pre-operative Diagnosis: End-stage renal disease Post-operative diagnosis:  Same Surgeon:  Annamarie Major Assistants: Laurence Slate Procedure:   Left brachiocephalic fistula Anesthesia: MAC Blood Loss: Minimal Specimens: None  Findings: 4 mm cephalic vein.  Brachial artery was 3 mm.  Indications: The patient is now on dialysis.  She comes in today for fistula creation.  Procedure:  The patient was identified in the holding area and taken to [place] 11  The patient was then placed supine on the table. MAC anesthesia was administered.  The patient was prepped and draped in the usual sterile fashion.  A time out was called and antibiotics were administered.  1% lidocaine was used for local anesthesia.  The surface veins on the left arm were evaluated with ultrasound.  She appeared to have an excellent cephalic vein however in the mid forearm there was a change in size caliber.  Therefore I felt her best option was a left brachiocephalic fistula.  A transverse incision was made just proximal to the antecubital crease.  I first dissected out the cephalic vein.  This is a 4 mm vein.  Is fully mobilized throughout the width of the incision.  The brachial artery was then exposed and isolated.  This is a 3 mm disease-free artery.  The vein was then marked for orientation and ligated distally.  It was flushed with heparin saline.  The brachial artery was then occluded with vascular clamps.  A #11 blade was used to make an arteriotomy which was extended longitudinally with Potts scissors.  The vein was cut the appropriate length and spatulated to fit the size of the arteriotomy.  A running anastomosis was created with 6-0 Prolene.  Prior to completion the appropriate flushing maneuvers were performed and the anastomosis was completed.  There was an excellent thrill within the fistula and a palpable radial  pulse.  I inspected the course of the vein to make sure there were no kinks.  Hemostasis was achieved.  The incision was closed with 2 layers of 3-0 Vicryl followed by Dermabond.  There were no immediate complications.   Disposition: To PACU stable.   Theotis Burrow, M.D., [facility] [place] Office: [phone] Pager:  [phone]

## 2020-02-10 NOTE — Discharge Instructions (Signed)
° °  [facility] [place] ° °Discharge Instructions ° °AV Fistula or Graft Surgery for Dialysis Access ° °Please refer to the following instructions for your post-procedure care. Your surgeon or physician assistant will discuss any changes with you. ° °Activity ° °You may drive the day following your surgery, if you are comfortable and no longer taking prescription pain medication. Resume full activity as the soreness in your incision resolves. ° °Bathing/Showering ° °You may shower after you go home. Keep your incision dry for 48 hours. Do not soak in a bathtub, hot tub, or swim until the incision heals completely. You may not shower if you have a hemodialysis catheter. ° °Incision Care ° °Clean your incision with mild soap and water after 48 hours. Pat the area dry with a clean towel. You do not need a bandage unless otherwise instructed. Do not apply any ointments or creams to your incision. You may have skin glue on your incision. Do not peel it off. It will come off on its own in about one week. Your arm may swell a bit after surgery. To reduce swelling use pillows to elevate your arm so it is above your heart. Your doctor will tell you if you need to lightly wrap your arm with an ACE bandage. ° °Diet ° °Resume your normal diet. There are not special food restrictions following this procedure. In order to heal from your surgery, it is CRITICAL to get adequate nutrition. Your body requires vitamins, minerals, and protein. Vegetables are the best source of vitamins and minerals. Vegetables also provide the perfect balance of protein. Processed food has little nutritional value, so try to avoid this. ° °Medications ° °Resume taking all of your medications. If your incision is causing pain, you may take over-the counter pain relievers such as acetaminophen (Tylenol). If you were prescribed a stronger pain medication, please be aware these medications can cause nausea and constipation. Prevent  nausea by taking the medication with a snack or meal. Avoid constipation by drinking plenty of fluids and eating foods with high amount of fiber, such as fruits, vegetables, and grains. Do not take Tylenol if you are taking prescription pain medications. ° ° ° ° °Follow up °Your surgeon may want to see you in the office following your access surgery. If so, this will be arranged at the time of your surgery. ° °Please call us immediately for any of the following conditions: ° °Increased pain, redness, drainage (pus) from your incision site °Fever of 101 degrees or higher °Severe or worsening pain at your incision site °Hand pain or numbness. ° °Reduce your risk of vascular disease: ° °Stop smoking. If you would like help, call [facility] at 1-800-QUIT-NOW (1-800-784-8669) or [place] at 336-586-4000 ° °Manage your cholesterol °Maintain a desired weight °Control your diabetes °Keep your blood pressure down ° °Dialysis ° °It will take several weeks to several months for your new dialysis access to be ready for use. Your surgeon will determine when it is OK to use it. Your nephrologist will continue to direct your dialysis. You can continue to use your Permcath until your new access is ready for use. ° °If you have any questions, please call the office at [phone]. ° °

## 2020-02-10 NOTE — Anesthesia Procedure Notes (Signed)
Procedure Name: MAC Date/Time: 02/10/2020 7:46 AM Performed by: Leonor Liv, CRNA Pre-anesthesia Checklist: Patient identified, Emergency Drugs available, Suction available, Patient being monitored and Timeout performed Patient Re-evaluated:Patient Re-evaluated prior to induction Oxygen Delivery Method: Simple face mask Placement Confirmation: positive ETCO2 Dental Injury: Teeth and Oropharynx as per pre-operative assessment

## 2020-02-17 ENCOUNTER — Ambulatory Visit (HOSPITAL_COMMUNITY)
Admission: RE | Admit: 2020-02-17 | Discharge: 2020-02-17 | Disposition: A | Discharge status: Home / Self Care | Payer: Medicare Other | Source: Ambulatory Visit | Attending: Internal Medicine | Admitting: Internal Medicine

## 2020-02-17 ENCOUNTER — Other Ambulatory Visit (HOSPITAL_COMMUNITY): Payer: Self-pay | Admitting: Internal Medicine

## 2020-02-17 ENCOUNTER — Ambulatory Visit (HOSPITAL_COMMUNITY): Payer: Medicare Other

## 2020-02-17 DIAGNOSIS — E049 Nontoxic goiter, unspecified: Secondary | ICD-10-CM | POA: Diagnosis not present

## 2020-03-06 ENCOUNTER — Other Ambulatory Visit: Payer: Self-pay | Admitting: *Deleted

## 2020-03-06 DIAGNOSIS — N186 End stage renal disease: Secondary | ICD-10-CM

## 2020-03-20 ENCOUNTER — Ambulatory Visit (HOSPITAL_COMMUNITY): Payer: Medicare Other

## 2020-03-28 ENCOUNTER — Ambulatory Visit (INDEPENDENT_AMBULATORY_CARE_PROVIDER_SITE_OTHER): Payer: Self-pay | Admitting: Physician Assistant

## 2020-03-28 ENCOUNTER — Other Ambulatory Visit: Payer: Self-pay | Admitting: Surgery

## 2020-03-28 ENCOUNTER — Ambulatory Visit (HOSPITAL_COMMUNITY)
Admission: RE | Admit: 2020-03-28 | Discharge: 2020-03-28 | Disposition: A | Discharge status: Home / Self Care | Payer: Medicare Other | Source: Ambulatory Visit | Attending: Surgery | Admitting: Surgery

## 2020-03-28 ENCOUNTER — Other Ambulatory Visit: Payer: Self-pay

## 2020-03-28 VITALS — BP 157/102 | HR 99 | Temp 97.8°F | Resp 20 | Ht 64.0 in | Wt 123.9 lb

## 2020-03-28 DIAGNOSIS — N186 End stage renal disease: Secondary | ICD-10-CM

## 2020-03-28 DIAGNOSIS — Z992 Dependence on renal dialysis: Secondary | ICD-10-CM | POA: Diagnosis not present

## 2020-03-28 DIAGNOSIS — E041 Nontoxic single thyroid nodule: Secondary | ICD-10-CM

## 2020-03-28 NOTE — Progress Notes (Addendum)
POST OPERATIVE OFFICE NOTE    CC:  F/u for surgery  HPI:  This is a 19 y.o. female who is s/p left brachiocephalic arteriovenous fistula by Dr. Trula Slade on 02/10/20. Her left arm is well healed. She denies any steal symptoms. She is having difficulty fully extending her left arm. She says it is improving since the surgery but it just feels very tight. She explains it as "feeling like she lifted something heavy and the muscles are sore".  She is not having any numbness, pain, coldness, or swelling.   The pt is on dialysis via right IJ TDC  Followed by Dr. Justin Mend.  Allergies  Allergen Reactions  . Enalapril Other (See Comments)    AKI  . Chlorhexidine Gluconate Hives  . 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 acetaminophen (TYLENOL) 500 MG tablet Take 500 mg by mouth every 6 (six) hours as needed for moderate pain or headache.    Marland Kitchen amLODipine (NORVASC) 10 MG tablet Take 1 tablet (10 mg total) by mouth daily. 90 tablet 1  . AURYXIA 1 GM 210 MG(Fe) tablet Take by mouth.    . carvedilol (COREG) 25 MG tablet TAKE 1 TABLET(25 MG) BY MOUTH TWICE DAILY WITH A MEAL (Patient taking differently: Take 25 mg by mouth 2 (two) times daily with a meal. ) 180 tablet 2  . Cinacalcet HCl (SENSIPAR PO) Take by mouth.    . etonogestrel (NEXPLANON) 68 MG IMPL implant 68 mg by Subdermal route once. Jan 2018    . heparin 1000 unit/mL SOLN injection Heparin Sodium (Porcine) 1,000 Units/mL Catheter Lock Venous    . iron sucrose in sodium chloride 0.9 % 100 mL Iron Sucrose (Venofer)    . Methoxy PEG-Epoetin Beta (MIRCERA IJ) Mircera    . sacubitril-valsartan (ENTRESTO) 24-26 MG Take 1 tablet by mouth 2 (two) times daily. 60 tablet 6  . sevelamer carbonate (RENVELA) 800 MG tablet Take 800 mg by mouth 3 (three) times daily with meals.    Marland Kitchen VITAMIN D PO Take by mouth.    . hydrALAZINE (APRESOLINE) 25  MG tablet Take 3 tablets (75 mg total) by mouth 3 (three) times daily. 270 tablet 3  . isosorbide mononitrate (IMDUR) 30 MG 24 hr tablet Take 1 tablet (30 mg total) by mouth daily. 90 tablet 3  . oxyCODONE-acetaminophen (PERCOCET/ROXICET) 5-325 MG tablet Take 1 tablet by mouth every 6 (six) hours as needed. 10 tablet 0   No current facility-administered medications for this visit.     ROS:  See HPI  Physical Exam:  Vitals:   03/28/20 1520  BP: (!) 157/102  Pulse: 99  Resp: 20  Temp: 97.8 F (36.6 C)  SpO2: 99%     Incision:  Left AC incision well healed. Dermabond adhesive still present. No surrounding erythema, swelling or hematoma Extremities:  There is a palpable 2+ radial pulse.  Motor and sensory are in tact. She is not able to fully extend the left upper arm. I tried to get her to passively and actively extend and she feels an intense pulling sensation along her bicep muscle. She says it is not painful. There is an a thrill/bruit present. The fistula is easily palpable in the left upper extremity   Dialysis Duplex on 03/28/20: Findings:  +--------------------+----------+-----------------+--------+  AVF         PSV (cm/s)Flow Vol (mL/min)Comments  +--------------------+----------+-----------------+--------+  Native artery inflow  180     2342          +--------------------+----------+-----------------+--------+  AVF Anastomosis     751                 +--------------------+----------+-----------------+--------+     +------------+----------+-------------+----------+--------+  OUTFLOW VEINPSV (cm/s)Diameter (cm)Depth (cm)Describe  +------------+----------+-------------+----------+--------+  Prox UA     135    0.68     0.33        +------------+----------+-------------+----------+--------+  Mid UA     327    0.67     0.23  stenotic    +------------+----------+-------------+----------+--------+  Dist UA     129    0.82     0.30        +------------+----------+-------------+----------+--------+  AC Fossa    194    0.94     0.29        +------------+----------+-------------+----------+--------+      Assessment/Plan:  This is a 19 y.o. female who is s/p left brachiocephalic arteriovenous fistula by Dr. Trula Slade on 02/10/20. Her left arm incision is well healed. Her fistula on duplex and on exam is functioning well. Duplex indicated a stenotic area in the mid upper arm but this is not evident on exam. Her vein is of a great size and depth. Easily palpable in the left upper arm. She is having difficulty extending her left arm past 90 degrees. She says that this is improving some. I have encouraged her to continue to try to mobilize the arm. Im uncertain if this is a contracture vs nerve related. Because of her young age I do not want this to become a debilitating thing for her. I will make a referral to outpatient PT -the pt does not have evidence of steal -the fistula/graft can be used after 05/12/20 -If pt has a tunneled dialysis catheter and the access has been used successfully to the satisfaction of the dialysis center, the tunneled catheter can be removed at their discretion.   -the pt will follow up in 1 month to re- evaluate her left arm mobility   Paulo Fruit, PA-C [facility] [phone]  Clinic MD:  Early

## 2020-04-04 ENCOUNTER — Other Ambulatory Visit: Payer: Medicare Other

## 2020-04-05 ENCOUNTER — Other Ambulatory Visit: Payer: Self-pay | Admitting: Nephrology

## 2020-04-06 ENCOUNTER — Other Ambulatory Visit: Payer: Self-pay | Admitting: Nephrology

## 2020-04-06 DIAGNOSIS — M7989 Other specified soft tissue disorders: Secondary | ICD-10-CM

## 2020-04-11 ENCOUNTER — Other Ambulatory Visit (HOSPITAL_COMMUNITY)
Admission: RE | Admit: 2020-04-11 | Discharge: 2020-04-11 | Disposition: A | Discharge status: Home / Self Care | Payer: Medicare Other | Source: Ambulatory Visit | Attending: Radiology | Admitting: Radiology

## 2020-04-11 ENCOUNTER — Ambulatory Visit
Admission: RE | Admit: 2020-04-11 | Discharge: 2020-04-11 | Disposition: A | Discharge status: Home / Self Care | Payer: Medicare Other | Source: Ambulatory Visit | Attending: Surgery | Admitting: Surgery

## 2020-04-11 DIAGNOSIS — E041 Nontoxic single thyroid nodule: Secondary | ICD-10-CM | POA: Diagnosis present

## 2020-04-12 ENCOUNTER — Ambulatory Visit
Admission: RE | Admit: 2020-04-12 | Discharge: 2020-04-12 | Disposition: A | Discharge status: Home / Self Care | Payer: Medicare Other | Source: Ambulatory Visit | Attending: Nephrology | Admitting: Nephrology

## 2020-04-12 DIAGNOSIS — M7989 Other specified soft tissue disorders: Secondary | ICD-10-CM

## 2020-04-13 LAB — CYTOLOGY - NON PAP

## 2020-04-27 ENCOUNTER — Other Ambulatory Visit: Payer: Self-pay | Admitting: Interventional Radiology

## 2020-04-27 ENCOUNTER — Other Ambulatory Visit: Payer: Self-pay | Admitting: Surgery

## 2020-04-27 DIAGNOSIS — E041 Nontoxic single thyroid nodule: Secondary | ICD-10-CM

## 2020-05-02 ENCOUNTER — Ambulatory Visit (INDEPENDENT_AMBULATORY_CARE_PROVIDER_SITE_OTHER): Payer: Medicare Other | Admitting: Physician Assistant

## 2020-05-02 ENCOUNTER — Other Ambulatory Visit: Payer: Self-pay

## 2020-05-02 VITALS — BP 160/102 | HR 105 | Temp 98.4°F | Resp 20 | Ht 64.0 in | Wt 118.3 lb

## 2020-05-02 DIAGNOSIS — Z992 Dependence on renal dialysis: Secondary | ICD-10-CM

## 2020-05-02 DIAGNOSIS — N186 End stage renal disease: Secondary | ICD-10-CM

## 2020-05-02 NOTE — Progress Notes (Signed)
POST OPERATIVE OFFICE NOTE    CC:  F/u for surgery  HPI:  This is a 19 y.o. female who is s/p left BC AVF on 02/10/2020 by Dominique Lawrence.     She was last seen on 03/28/2020 and her fistula was functioning well on exam.  The duplex had indicated a stenotic area in the mid upper arm but this was not evident on exam and the vein was good size and depth.  She was having trouble extending her arm past 90 degrees.  She was referred to outpatient physical therapy.   She returns today for follow up.  She states that she still cannot fully extend her left arm.  She states it has been this way since surgery.  She states that she has been working on this since her last visit and she has made progress, but still hurts.  She did not go for outpatient PT and unsure of why this did not happen.  She does not have any numbness or pain in her left hand.  Her incision has healed nicely.     The pt is on dialysis M/W/F at [facility] location.   Allergies  Allergen Reactions  . Enalapril Other (See Comments)    AKI  . Chlorhexidine Gluconate Hives  . 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 acetaminophen (TYLENOL) 500 MG tablet Take 500 mg by mouth every 6 (six) hours as needed for moderate pain or headache.    Marland Kitchen amLODipine (NORVASC) 10 MG tablet Take 1 tablet (10 mg total) by mouth daily. 90 tablet 1  . AURYXIA 1 GM 210 MG(Fe) tablet Take by mouth.    . carvedilol (COREG) 25 MG tablet TAKE 1 TABLET(25 MG) BY MOUTH TWICE DAILY WITH A MEAL (Patient taking differently: Take 25 mg by mouth 2 (two) times daily with a meal. ) 180 tablet 2  . Cinacalcet HCl (SENSIPAR PO) Take by mouth.    . etonogestrel (NEXPLANON) 68 MG IMPL implant 68 mg by Subdermal route once. Jan 2018    . heparin 1000 unit/mL SOLN injection Heparin Sodium (Porcine) 1,000 Units/mL Catheter Lock Venous    . hydrALAZINE  (APRESOLINE) 25 MG tablet Take 3 tablets (75 mg total) by mouth 3 (three) times daily. 270 tablet 3  . isosorbide mononitrate (IMDUR) 30 MG 24 hr tablet Take 1 tablet (30 mg total) by mouth daily. 90 tablet 3  . Methoxy PEG-Epoetin Beta (MIRCERA IJ) Mircera    . oxyCODONE-acetaminophen (PERCOCET/ROXICET) 5-325 MG tablet Take 1 tablet by mouth every 6 (six) hours as needed. 10 tablet 0  . sacubitril-valsartan (ENTRESTO) 24-26 MG Take 1 tablet by mouth 2 (two) times daily. 60 tablet 6  . sevelamer carbonate (RENVELA) 800 MG tablet Take 800 mg by mouth 3 (three) times daily with meals.    Marland Kitchen VITAMIN D PO Take by mouth.     No current facility-administered medications for this visit.     ROS:  See HPI  Physical Exam:  Today's Vitals   05/02/20 1512  BP: (!) 160/102  Pulse: (!) 105  Resp: 20  Temp: 98.4 F (36.9 C)  TempSrc: Temporal  SpO2: 98%  Weight: 118 lb 4.8 oz (53.7 kg)  Height: 5\' 4"  (1.626 m)   Body mass index is 20.31 kg/m.   Incision:  Healed nicely Extremities:   There is a palpable left  radial pulse.   Motor and sensory are in tact.   There is a thrill present and is easily palpable   Dialysis Duplex on 03/28/2020: +------------+----------+-------------+----------+--------+  OUTFLOW VEINPSV (cm/s)Diameter (cm)Depth (cm)Describe  +------------+----------+-------------+----------+--------+  Prox UA     135    0.68     0.33        +------------+----------+-------------+----------+--------+  Mid UA     327    0.67     0.23  stenotic  +------------+----------+-------------+----------+--------+  Dist UA     129    0.82     0.30        +------------+----------+-------------+----------+--------+  AC Fossa    194    0.94     0.29        +------------+----------+-------------+----------+--------+    Assessment/Plan:  This is a 19 y.o. female who is s/p: left BC AVF on 02/10/2020 by  Dominique Lawrence.      -the pt does not have evidence of steal and her motor and sensory are in tact left hand. -she states that she has not been working with PT and did not know about this.  She has been working with extending her arm doing exercises herself and appears to be improved per her last office visit note and per the pt. I would estimate this to be around 30%.  She would benefit from outpatient PT and will refer her for this. Will have her return in 4 weeks to see Dominique Lawrence to follow up on her contracture.  -the fistula has an excellent thrill and easily palpable and looked good on duplex in July.  It can be used 05/12/2020. -If pt has a tunneled dialysis catheter and the access has been used successfully to the satisfaction of the dialysis center, the tunneled catheter can be removed at their discretion.     Dominique Lawrence, [facility] [phone]  Clinic MD:  Pt discussed with Dominique Lawrence

## 2020-05-09 ENCOUNTER — Other Ambulatory Visit (HOSPITAL_COMMUNITY)
Admission: RE | Admit: 2020-05-09 | Discharge: 2020-05-09 | Disposition: A | Discharge status: Home / Self Care | Payer: Medicare Other | Source: Ambulatory Visit | Attending: Surgery | Admitting: Surgery

## 2020-05-09 ENCOUNTER — Ambulatory Visit
Admission: RE | Admit: 2020-05-09 | Discharge: 2020-05-09 | Disposition: A | Discharge status: Home / Self Care | Payer: Medicare Other | Source: Ambulatory Visit | Attending: Surgery | Admitting: Surgery

## 2020-05-09 DIAGNOSIS — E041 Nontoxic single thyroid nodule: Secondary | ICD-10-CM

## 2020-05-11 LAB — CYTOLOGY - NON PAP

## 2020-05-23 ENCOUNTER — Other Ambulatory Visit: Payer: Self-pay | Admitting: Cardiology

## 2020-06-01 ENCOUNTER — Encounter (HOSPITAL_COMMUNITY): Payer: Self-pay | Admitting: Internal Medicine

## 2020-06-01 ENCOUNTER — Ambulatory Visit (HOSPITAL_COMMUNITY)
Admission: RE | Admit: 2020-06-01 | Discharge: 2020-06-01 | Disposition: A | Discharge status: Home / Self Care | Payer: Medicare Other | Source: Ambulatory Visit | Attending: Internal Medicine | Admitting: Internal Medicine

## 2020-06-01 ENCOUNTER — Other Ambulatory Visit: Payer: Self-pay

## 2020-06-01 ENCOUNTER — Ambulatory Visit: Payer: Self-pay | Admitting: Surgery

## 2020-06-01 VITALS — BP 162/82 | HR 99 | Ht 64.0 in | Wt 121.0 lb

## 2020-06-01 DIAGNOSIS — Z8616 Personal history of COVID-19: Secondary | ICD-10-CM | POA: Diagnosis not present

## 2020-06-01 DIAGNOSIS — N186 End stage renal disease: Secondary | ICD-10-CM | POA: Diagnosis not present

## 2020-06-01 DIAGNOSIS — Z992 Dependence on renal dialysis: Secondary | ICD-10-CM | POA: Diagnosis not present

## 2020-06-01 DIAGNOSIS — N2581 Secondary hyperparathyroidism of renal origin: Secondary | ICD-10-CM | POA: Insufficient documentation

## 2020-06-01 DIAGNOSIS — R221 Localized swelling, mass and lump, neck: Secondary | ICD-10-CM | POA: Diagnosis not present

## 2020-06-01 DIAGNOSIS — Z79899 Other long term (current) drug therapy: Secondary | ICD-10-CM | POA: Insufficient documentation

## 2020-06-01 DIAGNOSIS — I5022 Chronic systolic (congestive) heart failure: Secondary | ICD-10-CM | POA: Insufficient documentation

## 2020-06-01 DIAGNOSIS — Z905 Acquired absence of kidney: Secondary | ICD-10-CM | POA: Diagnosis not present

## 2020-06-01 DIAGNOSIS — I313 Pericardial effusion (noninflammatory): Secondary | ICD-10-CM | POA: Insufficient documentation

## 2020-06-01 DIAGNOSIS — I132 Hypertensive heart and chronic kidney disease with heart failure and with stage 5 chronic kidney disease, or end stage renal disease: Secondary | ICD-10-CM | POA: Diagnosis not present

## 2020-06-01 DIAGNOSIS — Q8781 Alport syndrome: Secondary | ICD-10-CM | POA: Insufficient documentation

## 2020-06-01 DIAGNOSIS — I3139 Other pericardial effusion (noninflammatory): Secondary | ICD-10-CM

## 2020-06-01 MED ORDER — ENTRESTO 49-51 MG PO TABS: 1.0000 | ORAL_TABLET | Freq: Two times a day (BID) | ORAL | 6 refills | Status: DC

## 2020-06-01 NOTE — Patient Instructions (Signed)
Increase Entresto to 49/51 mg Twice daily   Your physician recommends that you schedule a follow-up appointment in: 3 months with echocardiogram  If you have any questions or concerns before your next appointment please send [place] a message through [place] or call our office at [phone].    TO LEAVE A MESSAGE FOR THE NURSE SELECT OPTION 2, PLEASE LEAVE A MESSAGE INCLUDING: . YOUR NAME . DATE OF BIRTH . CALL BACK NUMBER . REASON FOR CALL**this is important as we prioritize the call backs  [place] AS LONG AS YOU CALL BEFORE 4:00 PM  At the [facility], you and your health needs are our priority. As part of our continuing mission to provide you with exceptional heart care, we have created designated Provider Care Teams. These Care Teams include your primary Cardiologist (physician) and Advanced Practice Providers (APPs- Physician Assistants and Nurse Practitioners) who all work together to provide you with the care you need, when you need it.   You may see any of the following providers on your designated Care Team at your next follow up: Marland Kitchen Dr Glori Bickers . Dr Loralie Champagne . Darrick Grinder, NP . Lyda Jester, PA . Audry Riles, PharmD   Please be sure to bring in all your medications bottles to every appointment.

## 2020-06-01 NOTE — Addendum Note (Signed)
Encounter addended by: Scarlette Calico, RN on: 06/01/2020 3:42 PM  Actions taken: Order list changed, Diagnosis association updated, Clinical Note Signed

## 2020-06-01 NOTE — Progress Notes (Signed)
[facility] CLINIC  PCP: Dr Paul Dykes Nephrology: Dr Justin Mend  Primary HF Cardiologist: Dr Haroldine Laws.  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 She is on HD MWF and followed by Dr. Justin Mend. 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 Now w/ subsequent secondary hyperparathyroidism, needing totalparathyroidectomy.   Of note, she was diagnosed w/ Covid 19 infection 08/2019 and hospitalized, treated w/decadron and remdesivir. Had 2D echo 08/23/19 showing moderate pericardial effusionbut no tamponade physiology,moderate MR/TR and severely reduced LVEF 25-30%. Hs troponin level was mildly elevated but flat trend, 72>>79. ? Myocarditis. Unable to get cMRi due to renal function. HF was treated w/ lasix + HD for volume management + coreg and hydralazine.   She was seen in general cardiology clinic 10/06/19 by Dr. Johnsie Cancel for preoperative clearance prior to plannedparathyroidectomy. Echo was repeated and demonstrated increased size of pericardial effusion, w/ progression from moderate to severe. No evidence of tamponade. LVEF 30-35%. RV normal size w/ hyperdynamic systolic function.   Admitted 10/07/19 after RHC/LHC for planned pericardial window. Cath showed normal coronaries, mild pulmonary HTN, EF 35%, and large pericardial effusion. On 10/08/19 had pericardial window with 700 cc removed. Cytology, ANA, ANCA, RF, congo red stain negative. Cytology with reactive mesothelial cells.   Today she returns for HF follow up. Says she feels ok. Just starting using LUE fistula. BP remains high. Entresto recently increased to bid. SBP typically 150-170 when she gets to HD. Goes down to 130 with HD. No CP, edema, orthopnea or PND. Unable to tolerate Bidil due to severe HAs.    Echo 01/25/20 EF 25-30% RV moderately down. Severe MR/TR Small effusion     ROS: All systems negative  except as listed in HPI, PMH and Problem List.  SH:  Social History   Socioeconomic History  . Marital status: Married    Spouse name: Not on file  . Number of children: Not on file  . Years of education: Not on file  . Highest education level: Not on file  Occupational History  . Not on file  Tobacco Use  . Smoking status: Never Smoker  . Smokeless tobacco: Never Used  Vaping Use  . Vaping Use: Never used  Substance and Sexual Activity  . Alcohol use: No  . Drug use: No  . Sexual activity: Not Currently    Birth control/protection: Implant    Comment: Nexplanon 09/2016  Other Topics Concern  . Not on file  Social History Narrative   Lives with Mom, Sister, & 2 Brothers. No pets at home. No one smokes.   Social Determinants of Health   Financial Resource Strain:   . Difficulty of Paying Living Expenses: Not on file  Food Insecurity:   . Worried About Charity fundraiser in the Last Year: Not on file  . Ran Out of Food in the Last Year: Not on file  Transportation Needs:   . Lack of Transportation (Medical): Not on file  . Lack of Transportation (Non-Medical): Not on file  Physical Activity:   . Days of Exercise per Week: Not on file  . Minutes of Exercise per Session: Not on file  Stress:   . Feeling of Stress : Not on file  Social Connections:   . Frequency of Communication with Friends and Family: Not on file  . Frequency of Social Gatherings with Friends and Family:  Not on file  . Attends Religious Services: Not on file  . Active Member of Clubs or Organizations: Not on file  . Attends Archivist Meetings: Not on file  . Marital Status: Not on file  Intimate Partner Violence:   . Fear of Current or Ex-Partner: Not on file  . Emotionally Abused: Not on file  . Physically Abused: Not on file  . Sexually Abused: Not on file    FH:  Family History  Problem Relation Age of Onset  . Alport syndrome Brother     Past Medical History:  Diagnosis  Date  . Alport syndrome   . Anemia   . Constipation 12/18/2013  . Depression 07/28/2013  . ESRD (end stage renal disease) ([place])     Transplant failed Hemo MWF at [facility].  Marland Kitchen Hearing loss    Bilateral hearing aids  . History of blood transfusion   . Hypertension   . Influenza B   . S/P wisdom tooth extraction 06/22/2016   Right mandibular dental pain  . Sinus tachycardia   . Wears glasses     Current Outpatient Medications  Medication Sig Dispense Refill  . acetaminophen (TYLENOL) 500 MG tablet Take 500 mg by mouth every 6 (six) hours as needed for moderate pain or headache.    Marland Kitchen amLODipine (NORVASC) 10 MG tablet TAKE 1 TABLET(10 MG) BY MOUTH DAILY 90 tablet 1  . carvedilol (COREG) 25 MG tablet TAKE 1 TABLET(25 MG) BY MOUTH TWICE DAILY WITH A MEAL (Patient taking differently: Take 25 mg by mouth 2 (two) times daily with a meal. ) 180 tablet 2  . etonogestrel (NEXPLANON) 68 MG IMPL implant 68 mg by Subdermal route once. Jan 2018    . heparin 1000 unit/mL SOLN injection Heparin Sodium (Porcine) 1,000 Units/mL Catheter Lock Venous    . hydrALAZINE (APRESOLINE) 25 MG tablet Take 3 tablets (75 mg total) by mouth 3 (three) times daily. 270 tablet 3  . isosorbide mononitrate (IMDUR) 30 MG 24 hr tablet Take 1 tablet (30 mg total) by mouth daily. 90 tablet 3  . Methoxy PEG-Epoetin Beta (MIRCERA IJ) Mircera    . sacubitril-valsartan (ENTRESTO) 24-26 MG Take 1 tablet by mouth 2 (two) times daily. 60 tablet 6  . AURYXIA 1 GM 210 MG(Fe) tablet Take by mouth.    . Cinacalcet HCl (SENSIPAR PO) Take by mouth.    . oxyCODONE-acetaminophen (PERCOCET/ROXICET) 5-325 MG tablet Take 1 tablet by mouth every 6 (six) hours as needed. 10 tablet 0  . VITAMIN D PO Take by mouth. (Patient not taking: Reported on 06/01/2020)     No current facility-administered medications for this encounter.    Vitals:   06/01/20 1457  BP: (!) 162/82  Pulse: 99  SpO2: 99%  Weight: 54.9 kg (121  lb)  Height: 5\' 4"  (1.626 m)    PHYSICAL EXAM: General:  Well appearing. No resp difficulty HEENT: normal Neck: supple. no JVD. Carotids 2+ bilat; no bruits. No lymphadenopathy or thryomegaly appreciated. Cor: PMI nondisplaced. Regular rate & rhythm. 2/6 MR/TR  +Permcath Lungs: clear Abdomen: soft, nontender, nondistended. No hepatosplenomegaly. No bruits or masses. Good bowel sounds. Extremities: no cyanosis, clubbing, rash, edema LUE fistula Neuro: alert & orientedx3, cranial nerves grossly intact. moves all 4 extremities w/o difficulty. Affect pleasant   ASSESSMENT & PLAN   1. Pericardial Effusion: large posterior effusion noted on echo 1/21, previously moderate on prior echo 12/20  - s/p pericardial window on 1/29. 800cc fluid.  Pericardial biopsy sent for amyloid (negative) . Cultures negative. Cytology negative -minimal effusion on echo 5/21 - Will repeat at next visit.   2. Chronic systolic HF: new diagnosed made 08/2019 while admitted for covid 19 infection. EF 25-30%. Hs troponin level at time of initial diganosis was mildly elevated but flat trend, 72>>79. ? Viral Myocarditis vs HTN. Unable to get cMRi due to renal function.  - Repeat Echo 1/21 EF 30-35%, global hypokinesis, mod LVH, RV normal size and hyperdynamic systolic function - Cath no CAD - NYHA I-II - on carvedilol 25 bid and Entresto 24/26 bid  - Failed bidil due to severe HAs  - BP markedly elevated. No issues with hyperkalemia.  I discussed with Dr. Justin Mend. Will increase Entresto 49/51 bid carefully.  - If EF not improving with GDMT and management of HTN may need to consider heart/kidney transplant eval but non-compliance and loss of previously kidney transplant are major barriers.  - repeat echo at next visit    3. 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 She is on HD MWF and followed by Dr. Justin Mend.  - management  per nephrology   4. HTN - BP markedly elevated.  - No issues with hyperkalemia.  I discussed with Dr. Justin Mend. Will increase Entresto    5. Neck swelling - Following with Dr. Gaye Pollack, MD  3:28 PM

## 2020-06-05 ENCOUNTER — Ambulatory Visit: Payer: Medicare Other | Admitting: Surgery

## 2020-06-12 ENCOUNTER — Other Ambulatory Visit (HOSPITAL_COMMUNITY): Payer: Self-pay | Admitting: Nephrology

## 2020-06-12 DIAGNOSIS — N186 End stage renal disease: Secondary | ICD-10-CM

## 2020-06-15 ENCOUNTER — Other Ambulatory Visit: Payer: Self-pay

## 2020-06-15 ENCOUNTER — Ambulatory Visit (HOSPITAL_COMMUNITY)
Admission: RE | Admit: 2020-06-15 | Discharge: 2020-06-15 | Disposition: A | Discharge status: Home / Self Care | Payer: Medicare Other | Source: Ambulatory Visit | Attending: Nephrology | Admitting: Nephrology

## 2020-06-15 DIAGNOSIS — Z992 Dependence on renal dialysis: Secondary | ICD-10-CM

## 2020-06-15 DIAGNOSIS — N186 End stage renal disease: Secondary | ICD-10-CM | POA: Diagnosis not present

## 2020-06-15 DIAGNOSIS — Z4901 Encounter for fitting and adjustment of extracorporeal dialysis catheter: Secondary | ICD-10-CM | POA: Insufficient documentation

## 2020-06-15 HISTORY — PX: IR REMOVAL TUN CV CATH W/O FL: IMG2289

## 2020-06-15 MED ORDER — LIDOCAINE HCL (PF) 1 % IJ SOLN
INTRAMUSCULAR | Status: AC
  Filled 2020-06-15: qty 30

## 2020-06-15 MED ORDER — LIDOCAINE HCL 1 % IJ SOLN
INTRAMUSCULAR | Status: DC | PRN
  Administered 2020-06-15: 30 mL via INTRADERMAL

## 2020-06-15 NOTE — Procedures (Signed)
Successful removal of tunneled HD (R)IJ HD cath No complications  Ascencion Dike PA-C Interventional Radiology 06/15/2020 1:31 PM

## 2020-07-10 ENCOUNTER — Ambulatory Visit (INDEPENDENT_AMBULATORY_CARE_PROVIDER_SITE_OTHER): Payer: Medicare Other | Admitting: Surgery

## 2020-07-10 ENCOUNTER — Encounter: Payer: Self-pay | Admitting: Surgery

## 2020-07-10 ENCOUNTER — Other Ambulatory Visit: Payer: Self-pay

## 2020-07-10 VITALS — BP 153/98 | HR 95 | Resp 20 | Ht 64.0 in | Wt 120.0 lb

## 2020-07-10 DIAGNOSIS — N186 End stage renal disease: Secondary | ICD-10-CM

## 2020-07-10 DIAGNOSIS — Z992 Dependence on renal dialysis: Secondary | ICD-10-CM

## 2020-07-10 NOTE — Progress Notes (Signed)
[facility] [place]  Patient name: Dominique Lawrence MRN: [id number] DOB: Jun 05, 2001 Sex: female   REASON FOR VISIT:    Follow up  [place]:    Dominique Lawrence is a 19 y.o. female who is status post left brachiocephalic fistula on 11/11/7423.  When she was seen in her postoperative visit, she was not having symptoms of steal however she had difficulty fully extending her arm.  A referral to physical therapy was made however she never received therapy.  She is now using her left brachiocephalic fistula at dialysis without difficulty  The patient is now on dialysis.  Her nephrologist is Dr. Justin Mend.   PAST MEDICAL HISTORY:   Past Medical History:  Diagnosis Date  . Alport syndrome   . Anemia   . Constipation 12/18/2013  . Depression 07/28/2013  . ESRD (end stage renal disease) ([place])     Transplant failed Hemo MWF at [facility].  Marland Kitchen Hearing loss    Bilateral hearing aids  . History of blood transfusion   . Hypertension   . Influenza B   . S/P wisdom tooth extraction 06/22/2016   Right mandibular dental pain  . Sinus tachycardia   . Wears glasses      FAMILY HISTORY:   Family History  Problem Relation Age of Onset  . Alport syndrome Brother     SOCIAL HISTORY:   Social History   Tobacco Use  . Smoking status: Never Smoker  . Smokeless tobacco: Never Used  Substance Use Topics  . Alcohol use: No     ALLERGIES:   Allergies  Allergen Reactions  . Enalapril Other (See Comments)    AKI  . Chlorhexidine Gluconate Hives  . 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 acetaminophen (TYLENOL) 500 MG tablet Take 500 mg by mouth every 6 (six) hours as needed for moderate pain or headache.    Marland Kitchen  amLODipine (NORVASC) 10 MG tablet TAKE 1 TABLET(10 MG) BY MOUTH DAILY 90 tablet 1  . AURYXIA 1 GM 210 MG(Fe) tablet Take by mouth.    . carvedilol (COREG) 25 MG tablet TAKE 1 TABLET(25 MG) BY MOUTH TWICE DAILY WITH A MEAL (Patient taking differently: Take 25 mg by mouth 2 (two) times daily with a meal. ) 180 tablet 2  . Cinacalcet HCl (SENSIPAR PO) Take by mouth.    . etonogestrel (NEXPLANON) 68 MG IMPL implant 68 mg by Subdermal route once. Jan 2018    . heparin 1000 unit/mL SOLN injection Heparin Sodium (Porcine) 1,000 Units/mL Catheter Lock Venous    . Methoxy PEG-Epoetin Beta (MIRCERA IJ) Mircera    . sacubitril-valsartan (ENTRESTO) 49-51 MG Take 1 tablet by mouth 2 (two) times daily. 60 tablet 6  . hydrALAZINE (APRESOLINE) 25 MG tablet Take 3 tablets (75 mg total) by mouth 3 (three) times daily. 270 tablet 3  . isosorbide mononitrate (IMDUR) 30 MG 24 hr tablet Take 1 tablet (30 mg total) by mouth daily. 90 tablet 3   No current facility-administered medications for this visit.    REVIEW OF SYSTEMS:   [X]  denotes positive finding, [ ]  denotes negative finding Cardiac  Comments:  Chest pain or chest pressure:    Shortness of breath upon exertion:    Short of breath when lying flat:  Irregular heart rhythm:        Vascular    Pain in calf, thigh, or hip brought on by ambulation:    Pain in feet at night that wakes you up from your sleep:     Blood clot in your veins:    Leg swelling:         Pulmonary    Oxygen at home:    Productive cough:     Wheezing:         Neurologic    Sudden weakness in arms or legs:     Sudden numbness in arms or legs:     Sudden onset of difficulty speaking or slurred speech:    Temporary loss of vision in one eye:     Problems with dizziness:         Gastrointestinal    Blood in stool:     Vomited blood:         Genitourinary    Burning when urinating:     Blood in urine:        Psychiatric    Major depression:         Hematologic      Bleeding problems:    Problems with blood clotting too easily:        Skin    Rashes or ulcers:        Constitutional    Fever or chills:      PHYSICAL EXAM:   Vitals:   07/10/20 1415  BP: (!) 153/98  Pulse: 95  Resp: 20  SpO2: 100%  Weight: 120 lb (54.4 kg)  Height: 5\' 4"  (1.626 m)    GENERAL: The patient is a well-nourished female, in no acute distress. The vital signs are documented above. CARDIAC: There is a regular rate and rhythm.  VASCULAR: Excellent thrill within left brachiocephalic fistula PULMONARY: Non-labored respirations MUSCULOSKELETAL: Still with contracture in the left elbow.  I try to straighten this and do passive range of motion.  She does have some discomfort with this NEUROLOGIC: No focal weakness or paresthesias are detected. SKIN: There are no ulcers or rashes noted. PSYCHIATRIC: The patient has a normal affect.  STUDIES:   None  MEDICAL ISSUES:   End-stage renal disease: Her fistula is functioning nicely.  Unfortunately she still has a left arm contracture and never received a call from physical therapy.  I am making another referral.  Hopefully they can help increase the functionality and mobility of her left arm.    Leia Alf, MD, FACS [facility] [phone] Pager [phone]

## 2020-07-10 NOTE — Pre-Procedure Instructions (Addendum)
Your procedure is scheduled on Monday, November 8, from 08:45 AM- 11:15 AM.  Report to [facility] Main Entrance "A" at 06:45 A.M., and check in at the Admitting office.  Call this number if you have problems the morning of surgery:  [phone]  Call [phone] if you have any questions prior to your surgery date Monday-Friday 8am-4pm.    Remember:  Do not eat after midnight the night before your surgery.  You may drink clear liquids until 05:45 AM the morning of your surgery.   Clear liquids allowed are: Water, Non-Citrus Juices (without pulp), Carbonated Beverages, Clear Tea, Black Coffee Only, and Gatorade.    Take these medicines the morning of surgery with A SIP OF WATER:  amLODipine (NORVASC)  carvedilol (COREG) hydrALAZINE (APRESOLINE)  isosorbide mononitrate (IMDUR)   IF NEEDED: acetaminophen (TYLENOL)   As of today, STOP taking any Aspirin (unless otherwise instructed by your surgeon) Aleve, Naproxen, Ibuprofen, Motrin, Advil, Goody's, BC's, all herbal medications, fish oil, and all vitamins.   The Morning of Surgery:                    Do not wear jewelry, make up, or nail polish.            Do not wear lotions, powders, perfumes.            Do not shave 48 hours prior to surgery.              Do not bring valuables to the hospital.            [facility] is not responsible for any belongings or valuables.  Do NOT Smoke (Tobacco/Vaping) or drink Alcohol 24 hours prior to your procedure.  If you use a CPAP at night, you may bring all equipment for your overnight stay.   Contacts, glasses, dentures or bridgework may not be worn into surgery.      For patients admitted to the hospital, discharge time will be determined by your treatment team.   Patients discharged the day of surgery will not be allowed to drive home, and someone needs to stay with them for 24 hours.    Special instructions:   [place]- Preparing For Surgery  Before surgery, you can  play an important role. Because skin is not sterile, your skin needs to be as free of germs as possible. You can reduce the number of germs on your skin by washing with antibacterial soap, such as Dial before surgery.      Oral Hygiene is also important to reduce your risk of infection.  Remember - BRUSH YOUR TEETH THE MORNING OF SURGERY WITH YOUR REGULAR TOOTHPASTE   Please follow these instructions carefully.   1. Shower the NIGHT BEFORE SURGERY and the MORNING OF SURGERY with antibacterial soap, such as Dial.   2. If you chose to wash your hair, wash your hair first as usual with your normal shampoo.  3. Wash thoroughly, paying special attention to the area where your surgery will be performed.  4. Pat yourself dry with a CLEAN TOWEL.  5. Wear CLEAN PAJAMAS to bed the night before surgery  6. Place CLEAN SHEETS on your bed the night of your first shower and DO NOT SLEEP WITH PETS.   Day of Surgery: SHOWER Wear Clean/Comfortable clothing the morning of surgery Do not apply any deodorants/lotions.   Remember to brush your teeth WITH YOUR REGULAR TOOTHPASTE.   Please read over the following  fact sheets that you were given.

## 2020-07-11 ENCOUNTER — Encounter (HOSPITAL_COMMUNITY): Payer: Self-pay

## 2020-07-11 ENCOUNTER — Encounter (HOSPITAL_COMMUNITY)
Admission: RE | Admit: 2020-07-11 | Discharge: 2020-07-11 | Disposition: A | Discharge status: Home / Self Care | Payer: Medicare Other | Source: Ambulatory Visit | Attending: Surgery | Admitting: Surgery

## 2020-07-11 ENCOUNTER — Other Ambulatory Visit: Payer: Self-pay

## 2020-07-11 ENCOUNTER — Ambulatory Visit (HOSPITAL_COMMUNITY)
Admission: RE | Admit: 2020-07-11 | Discharge: 2020-07-11 | Disposition: A | Discharge status: Home / Self Care | Payer: Medicare Other | Source: Ambulatory Visit | Attending: Anesthesiology | Admitting: Anesthesiology

## 2020-07-11 DIAGNOSIS — R059 Cough, unspecified: Secondary | ICD-10-CM | POA: Diagnosis not present

## 2020-07-11 DIAGNOSIS — Z01818 Encounter for other preprocedural examination: Secondary | ICD-10-CM | POA: Insufficient documentation

## 2020-07-11 HISTORY — DX: Chronic systolic (congestive) heart failure: I50.22

## 2020-07-11 LAB — CBC
HCT: 32.6 % — ABNORMAL LOW (ref 36.0–46.0)
Hemoglobin: 9.9 g/dL — ABNORMAL LOW (ref 12.0–15.0)
MCH: 30.7 pg (ref 26.0–34.0)
MCHC: 30.4 g/dL (ref 30.0–36.0)
MCV: 101.2 fL — ABNORMAL HIGH (ref 80.0–100.0)
Platelets: 145 10*3/uL — ABNORMAL LOW (ref 150–400)
RBC: 3.22 MIL/uL — ABNORMAL LOW (ref 3.87–5.11)
RDW: 15.1 % (ref 11.5–15.5)
WBC: 8.8 10*3/uL (ref 4.0–10.5)
nRBC: 0 % (ref 0.0–0.2)

## 2020-07-11 LAB — BASIC METABOLIC PANEL
Anion gap: 14 (ref 5–15)
BUN: 49 mg/dL — ABNORMAL HIGH (ref 6–20)
CO2: 30 mmol/L (ref 22–32)
Calcium: 10.1 mg/dL (ref 8.9–10.3)
Chloride: 96 mmol/L — ABNORMAL LOW (ref 98–111)
Creatinine, Ser: 7.98 mg/dL — ABNORMAL HIGH (ref 0.44–1.00)
GFR, Estimated: 7 mL/min — ABNORMAL LOW (ref >60)
Glucose, Bld: 84 mg/dL (ref 70–99)
Potassium: 5 mmol/L (ref 3.5–5.1)
Sodium: 140 mmol/L (ref 135–145)

## 2020-07-11 NOTE — Progress Notes (Signed)
Consent missing laterality. Called Dr. Gala Lewandowsky office and spoke with Merleen Nicely, RN who will relay message for Dr. Harlow Asa. Also IBM'd Dr. Harlow Asa.

## 2020-07-11 NOTE — Progress Notes (Addendum)
Elevated BP at PAT appointment: BP elevated. Pt denies pain, SHOB, CP. Per patient, she took her scheduled hydralazine before coming in to PAT appointment. However, her other BP meds (amLODipine, carvedilol, isosorbide mononitrate), she usually takes at night. Pt goes to HD MWF. Last HD was yesterday, 07/10/20. Further, pt c/o new cough x 1 week; denies fever or other flu-like symptoms. Covid test scheduled 07/13/20. Myra Gianotti, PA-C notified-- evaluated pt during appointment. CXR done per anesthesia protocol. Pt instructed to take BP medications as scheduled.    07/11/20 1407 07/11/20 1421 07/11/20 1500  Vitals  Pulse Rate 93  --   --   Resp 17  --   --   BP (!) 168/113  --  (!) 160/112  SpO2 100 %  --   --   Pain Assessment  Pain Scale  --  0-10  --   Pain Score  --  0  --

## 2020-07-11 NOTE — Progress Notes (Addendum)
Anesthesia APP Evaluation: At 07/12/20 PAT visit  Case: [id number] Date/Time: 07/17/20 0830   Procedures:      TOTAL PARATHYROIDECTOMY WITH AUTOTRANSPLANT (N/A )     RIGHT THYROID LOBECTOMY (Right )   Anesthesia type: General   Pre-op diagnosis: SECONDARY HYPERPARATHYROIDISM   [facility] RO[facility] / [facility]   Surgeons: Armandina Gemma, MD      DISCUSSION: Patient is a 19 year old female scheduled for the above procedure. S/p left brachiocephalic AVF 02/14/11.   History includes never smoker, Alport Syndrome (with chronic renal failure since age 32; renal transplant 08/12/18 with transplant nephrectomy 04/20/19), ESRD (HD MWF), HTN, chornic systolic CHF (normal coronaries 09/2019), pericardial effusion (s/p subxiphoid pericardial window 10/08/19), sinus tachycardia, anemia, hearing loss (hearing aids), secondary hyperparathyroidectomy. + COVID-19 08/23/19.  - Admission 10/07/19-10/12/19 following cardiac showing normal coronaries, mild pulmonary HTN, EF 35%, and large pericardial effusion. S/p subxiphoid window 10/08/19 (700 cc removed. Cytology, ANA, ANCA, RF, congo red stain negative. Cytology with reactive mesothelial cells. Amyloid negative).  - Admission 12/14//20-12/15/20 (signed out [place]) for + COVID-19. Received remdesivir, Decadron until signed out AMA. Cardiologist Dr. Johnsie Cancel consulted for new diagnosis of dilated cardiomyopathy, EF 25-30%, LV global hypokinesis, moderate pericardial effusion, RVSP 44 mmHg. Hypertensive heart disease or COVID myocarditis considered for causes of her cardiomyopathy.   Last evaluation with HF cardiologist Dr. Haroldine Laws 06/01/20. He discussed markedly elevated BP with Dr. Justin Mend. BP 162/82. Failed Bidil due to headaches. Entresto increased 49/51. He plans to repeat echo at next visit. Unable to get cMRI due to renal function. He added, "If EF not improving with GDMT and management of HTN may need to consider heart/kidney transplant eval but non-compliance and loss of previously  kidney transplant are major barriers." He notes that Dr. Johnsie Cancel had seen 10/06/19 for planned parathyroidectomy leading to cath and pericardial window. On 02/08/20, Dr. Haroldine Laws had "ceared from a cardiac perspective..."   BP elevated at PAT 168/113, 160/112 (RUE). She had taken hydralazine prior to PAT visit and Coreg after breakfast. Reports morning BP at hemodialysis ~ 130's/90's. She is scheduled to take evening doses of her five BP medications (see below). She denied chest pain, SOB, edema. She did report an intermittent productive cough that started about 1 weeks ago. She denied any associated SOB, fever, chills, sore throat. She has not noticed any changes in her activity tolerance. Lungs sounds were clear. O2 sats on RA 100%. HR 93 bpm. She reports compliance with HD (MWF with sessions 3 hour 45 min). She has been taking Mucinex. She is not aware of any allergy issues.  BP medication include: Hydralazine 25 mg 3 tablets TID (after breakfast, lunch, dinner) Coreg 25 mg BID (after breakfast and dinner) Entresto 49-51mg  BID Imdur 30 mg Q HS Amlodipine 10 mg Q HS  She had reported BP readings at [place] [phone]) as being in the ~ 130/90's. However, in review of records in [place] her most recent readings were:  07/10/2020 07/07/20 07/03/20  Sitting BP 177/113, after 161/99 199/133, after 191/126 160/105, after 143/100  Standing BP  174/112, after 166/100 198/132, after 205/118 173/115, after 156/99    Given mild intermittent cough as discussed about, CXR was done at PAT and showed no focal consolidation, cardiomegaly, mild central pulmonary/interstitial prominence that may reflect edema.   Reviewed above with anesthesiologist Annye Asa, MD. Patient reports compliance with HTN/HF medications, but BP still quite elevated. Dr. Haroldine Laws discussed HTN  management with Dr. Justin Mend in September. Recommend contacting  nephrology to see if BP can be better optimized prior to surgery, so I spoke with dialysis nurse Will. He will contact nephrology provider 07/12/20 to relay message. I also updated [facility] triage nurse Claiborne Billings about BP and cough/CXR findings, and she will review with Dr. Harlow Asa.   COVID-19 test is scheduled for 07/13/20.   UPDATE 07/14/20 9:46 AM:  COVID-19 test negative. Return call this morning from Veneta Penton, [place] with nephrology. Hydralazine was increased to 100 mg TID.   VS: BP (!) 160/112   Pulse 93   Temp 36.7 C   Resp 17   Ht 5\' 4"  (1.626 m)   Wt 56.5 kg   SpO2 100%   BMI 21.37 kg/m  Update: Provider wore N95, universal mask, face shield. Patient wore face mask. Exam showed: Heart RRR, no murmur noted. Lung clear without wheezes, rhonchi, crackles. Slightly diminished right base. No ankle edema. No apparent distress, sitting comfortably. No conversational dyspnea or labored breathing. Has LUE AVF.    PROVIDERS: Et[place]fagh, Paul Dykes, MD is PCP [facility] for [place]); however, she reports that she is primarily followed by nephrology since resuming hemodialysis - Edrick Oh, MD is nephrologist - Glori Bickers, MD is HF cardiologist - Jenkins Rouge, MD is cardiologist - Prescott Gum, Collier Salina, MD is CT surgeon   LABS: Preoperative labs noted. She will need pregnancy test and ISTAT labs on the day of surgery.  (all labs ordered are listed, but only abnormal results are displayed)  Labs Reviewed  BASIC METABOLIC PANEL - Abnormal; Notable for the following components:      Result Value   Chloride 96 (*)    BUN 49 (*)    Creatinine, Ser 7.98 (*)    GFR, Estimated 7 (*)    All other components within normal limits  CBC - Abnormal; Notable for the following components:   RBC 3.22 (*)    Hemoglobin 9.9 (*)    HCT 32.6 (*)    MCV 101.2 (*)    Platelets 145 (*)    All other components within normal limits   H/H essentially unchanged since  02/10/20.   IMAGES: CXR 07/11/20: FINDINGS: Interval removal of right IJ CVC. Cardiomegaly. Hypoinflated lungs. No focal consolidation. Mild interstitial and central pulmonary vascular prominence. IMPRESSION: No focal consolidation. Cardiomegaly. Mild central pulmonary/interstitial prominence may reflect edema.  US Thyroid 02/17/20: IMPRESSION: 1. Dominant 4.2 cm TR 4 thyroid nodule in the right inferior thyroid gland. Fine-needle aspiration is recommended for this thyroid nodule. 2. There is a 1.2 cm TR 4 thyroid nodule in the right mid thyroid gland. A 1 year follow-up ultrasound is recommended for this thyroid nodule. - S/p right thyroid FNA 04/11/20, 6/94/85: Scant follicular epithelium present (Bethesda category I)   EKG: 10/19/19: Sinus tachycardia @ 106 bpm Possible Left atrial enlargement Borderline ECG No significant change since last tracing Confirmed by Camnitz, Will [phone]) on 10/19/2019 3:54:32 PM   CV: Echo 01/25/20: IMPRESSIONS  1. Left ventricular ejection fraction, by estimation, is 25 to 30%. The  left ventricle has severely decreased function. The left ventricle  demonstrates global hypokinesis. The left ventricular internal cavity size  was mildly dilated. There is mild left  ventricular hypertrophy. Left ventricular diastolic parameters are  indeterminate.  2. Right ventricular systolic function is moderately reduced. The right  ventricular size is mildly enlarged. There is mildly elevated pulmonary  artery systolic pressure.  3. Left atrial size was  moderately dilated.  4. Right atrial size was mild to moderately dilated.  5. The mitral valve is grossly normal. Severe mitral valve regurgitation.  6. Tricuspid valve regurgitation is severe.  7. The aortic valve is normal in structure. Aortic valve regurgitation is  not visualized. No aortic stenosis is present.  8. Pulmonic valve regurgitation is moderate.  9. Moderately dilated pulmonary  artery.  (Comparison: LVEF 30-35%, LV global hypokinesis, hyperdynamic RV systolic function, small RV size, large pericardial effusion, mild-moderate MR)   Cardiac cath 10/07/19: Findings: Ao = 147/113 (130) LV = 152/16 RA = 5 RV = 54/12 PA = 51/21 (35) PCW = 21 Fick cardiac output/index = 7.1/4.4 Thermo CO/CI = 7.9/4.9 PVR = 1.9 WU FA sat = 98% PA sat = 76%, 76%  Assessment: 1. Normal coronary arteries 2. Mild pulmonary HTN due to elevated left-sided pressures and high output 3. EF 35% by echo 4. Large pericardial effusion with no evidence of tamponade physiology  Plan/Discussion: For pericardial window tomorrow.    Past Medical History:  Diagnosis Date  . Alport syndrome   . Anemia   . Chronic systolic CHF (congestive heart failure) ([place])   . Constipation 12/18/2013  . Depression 07/28/2013  . ESRD (end stage renal disease) ([place])     Transplant failed Hemo MWF at [facility].  Marland Kitchen Hearing loss    Bilateral hearing aids  . History of blood transfusion   . Hypertension   . Influenza B   . S/P wisdom tooth extraction 06/22/2016   Right mandibular dental pain  . Sinus tachycardia   . Wears glasses     Past Surgical History:  Procedure Laterality Date  . AV FISTULA PLACEMENT Left 02/10/2020   Procedure: ARTERIOVENOUS (AV) FISTULA CREATION;  Surgeon: Serafina Mitchell, MD;  Location: [place];  Service: Vascular;  Laterality: Left;  . CENTRAL VENOUS CATHETER INSERTION  10/08/2019   Procedure: Insertion Central Line Adult;  Surgeon: Prescott Gum, Collier Salina, MD;  Location: [place];  Service: Thoracic;;  . Hemodialysis catheter placed Right   . IR REMOVAL TUN CV CATH W/O FL  06/15/2020  . 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 NEPHRECTOMY Right   . RIGHT/LEFT HEART CATH AND CORONARY ANGIOGRAPHY N/A 10/07/2019   Procedure: RIGHT/LEFT HEART CATH AND CORONARY ANGIOGRAPHY;  Surgeon: Jolaine Artist, MD;  Location: [place] CV LAB;  Service: Cardiovascular;   Laterality: N/A;  . SUBXYPHOID PERICARDIAL WINDOW N/A 10/08/2019   Procedure: SUBXYPHOID PERICARDIAL WINDOW;  Surgeon: Ivin Poot, MD;  Location: [place];  Service: Thoracic;  Laterality: N/A;  . TEE WITHOUT CARDIOVERSION N/A 10/08/2019   Procedure: TRANSESOPHAGEAL ECHOCARDIOGRAM (TEE);  Surgeon: Prescott Gum, Collier Salina, MD;  Location: [facility] OR;  Service: Thoracic;  Laterality: N/A;  . WISDOM TOOTH EXTRACTION      MEDICATIONS: . acetaminophen (TYLENOL) 500 MG tablet  . amLODipine (NORVASC) 10 MG tablet  . AURYXIA 1 GM 210 MG(Fe) tablet  . carvedilol (COREG) 25 MG tablet  . etonogestrel (NEXPLANON) 68 MG IMPL implant  . guaiFENesin (MUCINEX) 600 MG 12 hr tablet  . heparin 1000 unit/mL SOLN injection  . hydrALAZINE (APRESOLINE) 25 MG tablet  . isosorbide mononitrate (IMDUR) 30 MG 24 hr tablet  . Methoxy PEG-Epoetin Beta (MIRCERA IJ)  . sacubitril-valsartan (ENTRESTO) 49-51 MG   No current facility-administered medications for this encounter.    Myra Gianotti, PA-C Surgical Short Stay/Anesthesiology [facility] Phone [phone] [facility] Phone [phone] 07/12/2020 3:31 PM

## 2020-07-11 NOTE — Progress Notes (Signed)
PCP - Carmie End, MD Cardiologist - Glori Bickers, MD Nephrologist- Edrick Oh, MD  PPM/ICD - Denies  Chest x-ray - 07/11/20 (for new onset cough x 1 week) EKG - 10/19/19 Stress Test - Denies ECHO - 01/25/20 Cardiac Cath - 10/07/19  Sleep Study - Denies  Patient denies being diabetic.  Blood Thinner Instructions: N/A Aspirin Instructions: N/A  ERAS Protcol - Yes PRE-SURGERY Ensure or G2- Not ordered  COVID TEST- 07/13/20   Anesthesia review: Yes, cardiac hx.  Patient denies shortness of breath, fever, cough and chest pain at PAT appointment   All instructions explained to the patient, with a verbal understanding of the material. Patient agrees to go over the instructions while at home for a better understanding. Patient also instructed to self quarantine after being tested for COVID-19. The opportunity to ask questions was provided.

## 2020-07-12 ENCOUNTER — Encounter (HOSPITAL_COMMUNITY): Payer: Self-pay

## 2020-07-12 NOTE — Anesthesia Preprocedure Evaluation (Addendum)
Anesthesia Evaluation  Patient identified by MRN, date of birth, ID band Patient awake    Reviewed: Allergy & Precautions, NPO status , Patient's Chart, lab work & pertinent test results  Airway Mallampati: I  TM Distance: >3 FB Neck ROM: Full    Dental no notable dental hx.    Pulmonary neg pulmonary ROS,    Pulmonary exam normal breath sounds clear to auscultation       Cardiovascular hypertension, Pt. on medications +CHF  Normal cardiovascular exam Rhythm:Regular Rate:Normal  ECHO: 1. Left ventricular ejection fraction, by estimation, is 25 to 30%. The left ventricle has severely decreased function. The left ventricle demonstrates global hypokinesis. The left ventricular internal cavity size was mildly dilated. There is mild left ventricular hypertrophy. Left ventricular diastolic parameters are indeterminate. 2. Right ventricular systolic function is moderately reduced. The right ventricular size is mildly enlarged. There is mildly elevated pulmonary artery systolic pressure. 3. Left atrial size was moderately dilated. 4. Right atrial size was mild to moderately dilated. 5. The mitral valve is grossly normal. Severe mitral valve regurgitation. 6. Tricuspid valve regurgitation is severe. 7. The aortic valve is normal in structure. Aortic valve regurgitation is not visualized. No aortic stenosis is present. 8. Pulmonic valve regurgitation is moderate. 9. Moderately dilated pulmonary artery.   Neuro/Psych PSYCHIATRIC DISORDERS Depression Hearing loss negative neurological ROS     GI/Hepatic negative GI ROS, Neg liver ROS,   Endo/Other  negative endocrine ROS  Renal/GU ESRF and DialysisRenal disease     Musculoskeletal negative musculoskeletal ROS (+)   Abdominal   Peds  Hematology  (+) anemia ,   Anesthesia Other Findings SECONDARY HYPERPARATHYROIDISM  Reproductive/Obstetrics hcg negative                          Anesthesia Physical Anesthesia Plan  ASA: IV  Anesthesia Plan: General   Post-op Pain Management:    Induction: Intravenous  PONV Risk Score and Plan: 3 and Ondansetron, Dexamethasone, Midazolam and Treatment may vary due to age or medical condition  Airway Management Planned: Oral ETT  Additional Equipment: Arterial line  Intra-op Plan:   Post-operative Plan: Extubation in OR  Informed Consent: I have reviewed the patients History and Physical, chart, labs and discussed the procedure including the risks, benefits and alternatives for the proposed anesthesia with the patient or authorized representative who has indicated his/her understanding and acceptance.     Dental advisory given  Plan Discussed with: CRNA  Anesthesia Plan Comments: (Reviewed PAT note written by Myra Gianotti, PA-C. )      Anesthesia Quick Evaluation

## 2020-07-13 ENCOUNTER — Other Ambulatory Visit (HOSPITAL_COMMUNITY)
Admission: RE | Admit: 2020-07-13 | Discharge: 2020-07-13 | Disposition: A | Discharge status: Home / Self Care | Payer: Medicare Other | Source: Ambulatory Visit | Attending: Surgery | Admitting: Surgery

## 2020-07-13 DIAGNOSIS — Z01818 Encounter for other preprocedural examination: Secondary | ICD-10-CM | POA: Diagnosis present

## 2020-07-13 DIAGNOSIS — Z20822 Contact with and (suspected) exposure to covid-19: Secondary | ICD-10-CM | POA: Insufficient documentation

## 2020-07-13 LAB — SARS CORONAVIRUS 2 (TAT 6-24 HRS): SARS Coronavirus 2: NEGATIVE

## 2020-07-15 ENCOUNTER — Encounter (HOSPITAL_COMMUNITY): Payer: Self-pay | Admitting: Surgery

## 2020-07-15 DIAGNOSIS — D440 Neoplasm of uncertain behavior of thyroid gland: Secondary | ICD-10-CM | POA: Diagnosis present

## 2020-07-15 DIAGNOSIS — E041 Nontoxic single thyroid nodule: Secondary | ICD-10-CM | POA: Diagnosis present

## 2020-07-15 NOTE — H&P (Signed)
General Surgery [facility]  Dominique Lawrence DOB: May 17, 2001 Married / Language: English / Race: White Female   History of Present Illness   The patient is a 19 year old female who presents with a neck mass.  CHIEF COMPLAINT: right neck mass, hoarseness  Patient returns to my practice for follow-up. She had been evaluated in late 2020 for secondary hyperparathyroidism due to end-stage renal disease. She was undergoing preoperative evaluation for total parathyroidectomy. She was awaiting cardiac evaluation and clearance. Patient developed Covid-19 infection. She is still having problems with a chronic cough. Patient has been seen by cardiology and according to the patient underwent a cardiac echo. We are awaiting her of cardiac clearance regarding her parathyroid surgery.  Approximately 10-12 days ago the patient experienced onset of hoarseness and loss of voice. She then developed a relatively firm mass in the right anterior neck. Dr. Haroldine Laws ordered a ultrasound examination of the neck which is scheduled for June 10. Patient denies any previous such neck masses. She has had no prior thyroid problems. She is not on thyroid medication. She presents today for evaluation for secondary hyperparathyroidism and wishing to reschedule her parathyroidectomy.   Problem List/Past Medical MASS OF RIGHT SIDE OF NECK (R22.1)  ESRD (END STAGE RENAL DISEASE) ON DIALYSIS (N18.6)  SECONDARY HYPERPARATHYROIDISM, RENAL (N25.81)  HOARSENESS (R49.0)   Past Surgical History Nephrectomy  Right. Oral Surgery   Diagnostic Studies History  Colonoscopy  never Mammogram  never  Allergies Enalapril Maleate *ANTIHYPERTENSIVES*  Ibuprofen *ANALGESICS - ANTI-INFLAMMATORY*  Chlorhexidine Gluconate *ANTISEPTICS & DISINFECTANTS*  Allergies Reconciled   Medication History  Nexplanon (68MG  Implant, Subcutaneous) Active. Sevelamer Carbonate (800MG  Tablet,  Oral) Active. amLODIPine Besylate (10MG  Tablet, Oral) Active. Sensipar (60MG  Tablet, Oral) Active. Entresto (24-26MG  Tablet, Oral) Active. Isosorbide Mononitrate ER (30MG  Tablet ER 24HR, Oral) Active. hydrALAZINE HCl (25MG  Tablet, Oral) Active. Tylenol (500MG  Capsule, Oral) Active. Medications Reconciled  Social History  Caffeine use  Coffee. No alcohol use  No drug use  Tobacco use  Never smoker.  Family History Family history unknown  First Degree Relatives   Pregnancy / Birth History Age at menarche  45 years. Contraceptive History  Contraceptive implant. Gravida  0 Irregular periods   Other Problems Chronic Renal Failure Syndrome  High blood pressure  Other disease, cancer, significant illness   Vitals  Weight: 125.8 lb Height: 65in Body Surface Area: 1.62 m Body Mass Index: 20.93 kg/m  Temp.: 98.37F  Pulse: 107 (Regular)  BP: 148/100(Sitting, Left Arm, Standard)  Physical Exam   GENERAL APPEARANCE Development: normal Nutritional status: normal Gross deformities: none  SKIN Rash, lesions, ulcers: none Induration, erythema: none Nodules: none palpable  EYES Conjunctiva and lids: normal Pupils: equal and reactive Iris: normal bilaterally  EARS, NOSE, MOUTH, THROAT External ears: no lesion or deformity External nose: no lesion or deformity Hearing: grossly normal Due to Covid-19 pandemic, patient is wearing a mask.  NECK Symmetric: no Trachea: midline Thyroid: There is a visible, palpable firm mass in the right anterior neck likely overlying the lower pole of the right lobe of the thyroid. This is nontender but is relatively fixed. It does not appear to involve the dialysis catheter which is present in the right neck as well. Left thyroid lobe was without palpable abnormality. Patient does have significant hoarseness and a frequent chronic cough.  CHEST Respiratory effort: normal Retraction or accessory muscle use:  no Breath sounds: normal bilaterally Rales, rhonchi, wheeze: none  CARDIOVASCULAR Auscultation: regular rhythm, normal rate  Murmurs: none Pulses: radial pulse 2+ palpable Lower extremity edema: none  MUSCULOSKELETAL Station and gait: normal Digits and nails: no clubbing or cyanosis Muscle strength: grossly normal all extremities Range of motion: grossly normal all extremities Deformity: none  LYMPHATIC Cervical: none palpable Supraclavicular: none palpable  PSYCHIATRIC Oriented to person, place, and time: yes Mood and affect: normal for situation Judgment and insight: appropriate for situation    Assessment & Plan   SECONDARY HYPERPARATHYROIDISM, RENAL (N25.81) ESRD (END STAGE RENAL DISEASE) ON DIALYSIS (N18.6) MASS OF RIGHT SIDE OF NECK (R22.1) HOARSENESS (R49.0)  Follow Up - Call [facility] office after tests / studies doneto discuss further plans  The patient presents today in order to initiate the process of scheduling for surgery for management of secondary hyperparathyroidism. Unfortunately, there are several issues to be dealt with prior to surgery.  Patient has been seen by cardiology. We are awaiting the results of their evaluation with recommendations regarding parathyroid surgery. We will contact your office for follow-up.  Patient has developed a new right neck mass associated with hoarseness. This is concerning for a possible thyroid neoplasm. Ultrasound examination has been scheduled. We will contact radiology today and try to remove this study to the near future. If this is a thyroid mass, she may require fine-needle aspiration biopsy. We will try to expedite that process. We will also order a TSH level today.  As for proceeding with neck exploration and parathyroidectomy, we will need to put that on hold until we receive clearance from cardiology and until the right neck mass is better evaluated.  We will order the TSH level today and try to arrange for  the ultrasound examination of the neck in the near future. We will contact the patient with those results and make further plans for management at that time.  ADDENDUM  Telephone conversation with the patient regarding her second fine-needle aspiration biopsy of the thyroid returning with a result of inconclusive. There was not enough tissue for definitive diagnosis.  We discussed the fact that she has a large 4.2 cm nodule in the right thyroid lobe which we have attempted to biopsy twice. We have been unable to get adequate cytopathology. She also has a 1.2 cm nodule in the right thyroid lobe. I have recommended proceeding with right thyroid lobectomy for definitive diagnosis at the time of her total parathyroidectomy for management of secondary hyperparathyroidism. We discussed the procedure. We discussed the fact that she might require thyroid medication following the surgery. We discussed the hospital stay to be anticipated. The patient understands and wishes to proceed.  Orders will be entered today and submitted to scheduling.  The risks and benefits of the procedure have been discussed at length with the patient. The patient understands the proposed procedure, potential alternative treatments, and the course of recovery to be expected. All of the patient's questions have been answered at this time. The patient wishes to proceed with surgery.  Armandina Gemma, [place] [facility] Office: [phone]

## 2020-07-17 ENCOUNTER — Inpatient Hospital Stay (HOSPITAL_COMMUNITY): Payer: Medicare Other | Admitting: Vascular Surgery

## 2020-07-17 ENCOUNTER — Inpatient Hospital Stay (HOSPITAL_COMMUNITY): Payer: Medicare Other | Admitting: Certified Registered"

## 2020-07-17 ENCOUNTER — Encounter (HOSPITAL_COMMUNITY): Payer: Self-pay | Admitting: Surgery

## 2020-07-17 ENCOUNTER — Other Ambulatory Visit: Payer: Self-pay

## 2020-07-17 ENCOUNTER — Inpatient Hospital Stay (HOSPITAL_COMMUNITY)
Admission: RE | Admit: 2020-07-17 | Discharge: 2020-07-18 | DRG: 674 | Disposition: A | Discharge status: Home / Self Care | Payer: Medicare Other | Attending: Surgery | Admitting: Surgery

## 2020-07-17 ENCOUNTER — Encounter (HOSPITAL_COMMUNITY)
Admission: RE | Disposition: A | Discharge status: Home / Self Care | Payer: Self-pay | Source: Home / Self Care | Attending: Surgery

## 2020-07-17 DIAGNOSIS — Z888 Allergy status to other drugs, medicaments and biological substances status: Secondary | ICD-10-CM

## 2020-07-17 DIAGNOSIS — Q8781 Alport syndrome: Secondary | ICD-10-CM

## 2020-07-17 DIAGNOSIS — Z886 Allergy status to analgesic agent status: Secondary | ICD-10-CM | POA: Diagnosis not present

## 2020-07-17 DIAGNOSIS — Z973 Presence of spectacles and contact lenses: Secondary | ICD-10-CM | POA: Diagnosis not present

## 2020-07-17 DIAGNOSIS — E041 Nontoxic single thyroid nodule: Secondary | ICD-10-CM | POA: Diagnosis present

## 2020-07-17 DIAGNOSIS — D631 Anemia in chronic kidney disease: Secondary | ICD-10-CM | POA: Diagnosis present

## 2020-07-17 DIAGNOSIS — Z8616 Personal history of COVID-19: Secondary | ICD-10-CM | POA: Diagnosis not present

## 2020-07-17 DIAGNOSIS — Z79899 Other long term (current) drug therapy: Secondary | ICD-10-CM | POA: Diagnosis not present

## 2020-07-17 DIAGNOSIS — Z992 Dependence on renal dialysis: Secondary | ICD-10-CM | POA: Diagnosis not present

## 2020-07-17 DIAGNOSIS — I5022 Chronic systolic (congestive) heart failure: Secondary | ICD-10-CM | POA: Diagnosis present

## 2020-07-17 DIAGNOSIS — N186 End stage renal disease: Secondary | ICD-10-CM | POA: Diagnosis present

## 2020-07-17 DIAGNOSIS — Z905 Acquired absence of kidney: Secondary | ICD-10-CM | POA: Diagnosis not present

## 2020-07-17 DIAGNOSIS — Z20822 Contact with and (suspected) exposure to covid-19: Secondary | ICD-10-CM | POA: Diagnosis present

## 2020-07-17 DIAGNOSIS — Z793 Long term (current) use of hormonal contraceptives: Secondary | ICD-10-CM | POA: Diagnosis not present

## 2020-07-17 DIAGNOSIS — Z940 Kidney transplant status: Secondary | ICD-10-CM | POA: Diagnosis not present

## 2020-07-17 DIAGNOSIS — Z974 Presence of external hearing-aid: Secondary | ICD-10-CM

## 2020-07-17 DIAGNOSIS — I132 Hypertensive heart and chronic kidney disease with heart failure and with stage 5 chronic kidney disease, or end stage renal disease: Secondary | ICD-10-CM | POA: Diagnosis present

## 2020-07-17 DIAGNOSIS — N2581 Secondary hyperparathyroidism of renal origin: Secondary | ICD-10-CM | POA: Diagnosis present

## 2020-07-17 DIAGNOSIS — D440 Neoplasm of uncertain behavior of thyroid gland: Secondary | ICD-10-CM | POA: Diagnosis present

## 2020-07-17 HISTORY — PX: PARATHYROIDECTOMY: SHX19

## 2020-07-17 LAB — HCG, SERUM, QUALITATIVE: Preg, Serum: NEGATIVE

## 2020-07-17 LAB — POCT I-STAT, CHEM 8
BUN: 66 mg/dL — ABNORMAL HIGH (ref 6–20)
Calcium, Ion: 1.17 mmol/L (ref 1.15–1.40)
Chloride: 97 mmol/L — ABNORMAL LOW (ref 98–111)
Creatinine, Ser: 8.9 mg/dL — ABNORMAL HIGH (ref 0.44–1.00)
Glucose, Bld: 85 mg/dL (ref 70–99)
HCT: 29 % — ABNORMAL LOW (ref 36.0–46.0)
Hemoglobin: 9.9 g/dL — ABNORMAL LOW (ref 12.0–15.0)
Potassium: 5.5 mmol/L — ABNORMAL HIGH (ref 3.5–5.1)
Sodium: 138 mmol/L (ref 135–145)
TCO2: 29 mmol/L (ref 22–32)

## 2020-07-17 LAB — RESPIRATORY PANEL BY RT PCR (FLU A&B, COVID)
Influenza A by PCR: NEGATIVE
Influenza B by PCR: NEGATIVE
SARS Coronavirus 2 by RT PCR: NEGATIVE

## 2020-07-17 SURGERY — PARATHYROIDECTOMY
Anesthesia: General | Site: Arm Lower | Laterality: Left

## 2020-07-17 MED ORDER — FENTANYL CITRATE (PF) 250 MCG/5ML IJ SOLN
INTRAMUSCULAR | Status: AC
  Filled 2020-07-17: qty 5

## 2020-07-17 MED ORDER — SODIUM CHLORIDE 0.9 % IV SOLN: 100.0000 mL | INTRAVENOUS | Status: DC | PRN

## 2020-07-17 MED ORDER — HYDROMORPHONE HCL 1 MG/ML IJ SOLN: 1.0000 mg | INTRAMUSCULAR | Status: DC | PRN

## 2020-07-17 MED ORDER — CARVEDILOL 12.5 MG PO TABS
ORAL_TABLET | ORAL | Status: AC
  Filled 2020-07-17: qty 2

## 2020-07-17 MED ORDER — OXYCODONE HCL 5 MG PO TABS: 5.0000 mg | ORAL_TABLET | Freq: Once | ORAL | Status: DC | PRN

## 2020-07-17 MED ORDER — CEFAZOLIN SODIUM-DEXTROSE 2-4 GM/100ML-% IV SOLN
2.0000 g | INTRAVENOUS | Status: AC
  Administered 2020-07-17: 2 g via INTRAVENOUS
  Filled 2020-07-17: qty 100

## 2020-07-17 MED ORDER — ONDANSETRON HCL 4 MG/2ML IJ SOLN: 4.0000 mg | Freq: Four times a day (QID) | INTRAMUSCULAR | Status: DC | PRN

## 2020-07-17 MED ORDER — SODIUM CHLORIDE 0.45 % IV SOLN: INTRAVENOUS | Status: DC

## 2020-07-17 MED ORDER — FENTANYL CITRATE (PF) 100 MCG/2ML IJ SOLN: 25.0000 ug | INTRAMUSCULAR | Status: DC | PRN

## 2020-07-17 MED ORDER — BUPIVACAINE HCL (PF) 0.25 % IJ SOLN: INTRAMUSCULAR | Status: DC | PRN

## 2020-07-17 MED ORDER — HEPARIN SODIUM (PORCINE) 1000 UNIT/ML DIALYSIS
1000.0000 [IU] | INTRAMUSCULAR | Status: DC | PRN
  Filled 2020-07-17: qty 1

## 2020-07-17 MED ORDER — AMLODIPINE BESYLATE 10 MG PO TABS: 10.0000 mg | ORAL_TABLET | Freq: Every day | ORAL | Status: DC

## 2020-07-17 MED ORDER — OXYCODONE HCL 5 MG PO TABS
5.0000 mg | ORAL_TABLET | ORAL | Status: DC | PRN
  Administered 2020-07-17 – 2020-07-18 (×3): 10 mg via ORAL
  Filled 2020-07-17 (×4): qty 2

## 2020-07-17 MED ORDER — ALTEPLASE 2 MG IJ SOLR: 2.0000 mg | Freq: Once | INTRAMUSCULAR | Status: DC | PRN

## 2020-07-17 MED ORDER — DEXAMETHASONE SODIUM PHOSPHATE 10 MG/ML IJ SOLN
INTRAMUSCULAR | Status: AC
  Filled 2020-07-17: qty 1

## 2020-07-17 MED ORDER — ACETAMINOPHEN 325 MG PO TABS
650.0000 mg | ORAL_TABLET | Freq: Four times a day (QID) | ORAL | Status: DC | PRN
  Administered 2020-07-17 – 2020-07-18 (×2): 650 mg via ORAL
  Filled 2020-07-17 (×2): qty 2

## 2020-07-17 MED ORDER — TRAMADOL HCL 50 MG PO TABS: 50.0000 mg | ORAL_TABLET | Freq: Two times a day (BID) | ORAL | Status: DC | PRN

## 2020-07-17 MED ORDER — ALBUMIN HUMAN 5 % IV SOLN: INTRAVENOUS | Status: DC | PRN

## 2020-07-17 MED ORDER — ORAL CARE MOUTH RINSE
15.0000 mL | Freq: Once | OROMUCOSAL | Status: AC
  Administered 2020-07-17: 15 mL via OROMUCOSAL

## 2020-07-17 MED ORDER — ROCURONIUM BROMIDE 10 MG/ML (PF) SYRINGE
PREFILLED_SYRINGE | INTRAVENOUS | Status: AC
  Filled 2020-07-17: qty 10

## 2020-07-17 MED ORDER — SODIUM CHLORIDE 0.9 % IV SOLN: INTRAVENOUS | Status: DC

## 2020-07-17 MED ORDER — ESMOLOL HCL 100 MG/10ML IV SOLN
INTRAVENOUS | Status: AC
  Filled 2020-07-17: qty 10

## 2020-07-17 MED ORDER — CISATRACURIUM BESYLATE (PF) 10 MG/5ML IV SOLN
INTRAVENOUS | Status: DC | PRN
  Administered 2020-07-17: 10 mg via INTRAVENOUS

## 2020-07-17 MED ORDER — ACETAMINOPHEN 10 MG/ML IV SOLN: 1000.0000 mg | Freq: Once | INTRAVENOUS | Status: DC | PRN

## 2020-07-17 MED ORDER — LIDOCAINE 2% (20 MG/ML) 5 ML SYRINGE
INTRAMUSCULAR | Status: AC
  Filled 2020-07-17: qty 5

## 2020-07-17 MED ORDER — SUCCINYLCHOLINE CHLORIDE 200 MG/10ML IV SOSY
PREFILLED_SYRINGE | INTRAVENOUS | Status: AC
  Filled 2020-07-17: qty 10

## 2020-07-17 MED ORDER — CISATRACURIUM BESYLATE 20 MG/10ML IV SOLN
INTRAVENOUS | Status: AC
  Filled 2020-07-17: qty 10

## 2020-07-17 MED ORDER — MIDAZOLAM HCL 2 MG/2ML IJ SOLN
INTRAMUSCULAR | Status: AC
  Filled 2020-07-17: qty 2

## 2020-07-17 MED ORDER — PHENYLEPHRINE HCL-NACL 10-0.9 MG/250ML-% IV SOLN
INTRAVENOUS | Status: DC | PRN
  Administered 2020-07-17: 50 ug/min via INTRAVENOUS

## 2020-07-17 MED ORDER — PENTAFLUOROPROP-TETRAFLUOROETH EX AERO: 1.0000 "application " | INHALATION_SPRAY | CUTANEOUS | Status: DC | PRN

## 2020-07-17 MED ORDER — LIDOCAINE HCL (PF) 1 % IJ SOLN: 5.0000 mL | INTRAMUSCULAR | Status: DC | PRN

## 2020-07-17 MED ORDER — EPHEDRINE SULFATE-NACL 50-0.9 MG/10ML-% IV SOSY
PREFILLED_SYRINGE | INTRAVENOUS | Status: DC | PRN
  Administered 2020-07-17 (×2): 10 mg via INTRAVENOUS

## 2020-07-17 MED ORDER — ISOSORBIDE MONONITRATE ER 30 MG PO TB24: 30.0000 mg | ORAL_TABLET | Freq: Every day | ORAL | Status: DC

## 2020-07-17 MED ORDER — PROPOFOL 10 MG/ML IV BOLUS
INTRAVENOUS | Status: DC | PRN
  Administered 2020-07-17: 150 mg via INTRAVENOUS

## 2020-07-17 MED ORDER — LIDOCAINE-PRILOCAINE 2.5-2.5 % EX CREA
1.0000 "application " | TOPICAL_CREAM | CUTANEOUS | Status: DC | PRN
  Filled 2020-07-17: qty 5

## 2020-07-17 MED ORDER — HYDRALAZINE HCL 50 MG PO TABS
75.0000 mg | ORAL_TABLET | Freq: Three times a day (TID) | ORAL | Status: DC
  Administered 2020-07-17: 75 mg via ORAL
  Filled 2020-07-17: qty 1

## 2020-07-17 MED ORDER — PROPOFOL 10 MG/ML IV BOLUS
INTRAVENOUS | Status: AC
  Filled 2020-07-17: qty 20

## 2020-07-17 MED ORDER — HEMOSTATIC AGENTS (NO CHARGE) OPTIME
TOPICAL | Status: DC | PRN
  Administered 2020-07-17: 1 via TOPICAL

## 2020-07-17 MED ORDER — PROMETHAZINE HCL 25 MG/ML IJ SOLN: 6.2500 mg | INTRAMUSCULAR | Status: DC | PRN

## 2020-07-17 MED ORDER — OXYCODONE HCL 5 MG/5ML PO SOLN: 5.0000 mg | Freq: Once | ORAL | Status: DC | PRN

## 2020-07-17 MED ORDER — DOXERCALCIFEROL 4 MCG/2ML IV SOLN: 2.0000 ug | INTRAVENOUS | Status: DC

## 2020-07-17 MED ORDER — LIDOCAINE 2% (20 MG/ML) 5 ML SYRINGE
INTRAMUSCULAR | Status: DC | PRN
  Administered 2020-07-17: 60 mg via INTRAVENOUS

## 2020-07-17 MED ORDER — CARVEDILOL 12.5 MG PO TABS
25.0000 mg | ORAL_TABLET | Freq: Once | ORAL | Status: AC
  Administered 2020-07-17: 25 mg via ORAL

## 2020-07-17 MED ORDER — BUPIVACAINE HCL (PF) 0.25 % IJ SOLN
INTRAMUSCULAR | Status: AC
  Filled 2020-07-17: qty 30

## 2020-07-17 MED ORDER — 0.9 % SODIUM CHLORIDE (POUR BTL) OPTIME
TOPICAL | Status: DC | PRN
  Administered 2020-07-17: 1000 mL

## 2020-07-17 MED ORDER — ONDANSETRON HCL 4 MG/2ML IJ SOLN
INTRAMUSCULAR | Status: AC
  Filled 2020-07-17: qty 2

## 2020-07-17 MED ORDER — ONDANSETRON 4 MG PO TBDP: 4.0000 mg | ORAL_TABLET | Freq: Four times a day (QID) | ORAL | Status: DC | PRN

## 2020-07-17 MED ORDER — DARBEPOETIN ALFA 60 MCG/0.3ML IJ SOSY
60.0000 ug | PREFILLED_SYRINGE | INTRAMUSCULAR | Status: DC
  Filled 2020-07-17: qty 0.3

## 2020-07-17 MED ORDER — FERRIC CITRATE 1 GM 210 MG(FE) PO TABS
420.0000 mg | ORAL_TABLET | Freq: Three times a day (TID) | ORAL | Status: DC
  Administered 2020-07-17 – 2020-07-18 (×2): 420 mg via ORAL
  Filled 2020-07-17 (×5): qty 2

## 2020-07-17 MED ORDER — FENTANYL CITRATE (PF) 250 MCG/5ML IJ SOLN
INTRAMUSCULAR | Status: DC | PRN
  Administered 2020-07-17: 150 ug via INTRAVENOUS
  Administered 2020-07-17 (×6): 50 ug via INTRAVENOUS

## 2020-07-17 MED ORDER — ACETAMINOPHEN 650 MG RE SUPP: 650.0000 mg | Freq: Four times a day (QID) | RECTAL | Status: DC | PRN

## 2020-07-17 MED ORDER — CARVEDILOL 25 MG PO TABS
25.0000 mg | ORAL_TABLET | Freq: Two times a day (BID) | ORAL | Status: DC
  Administered 2020-07-17 – 2020-07-18 (×3): 25 mg via ORAL
  Filled 2020-07-17 (×3): qty 1

## 2020-07-17 MED ORDER — DEXAMETHASONE SODIUM PHOSPHATE 10 MG/ML IJ SOLN
INTRAMUSCULAR | Status: DC | PRN
  Administered 2020-07-17: 10 mg via INTRAVENOUS

## 2020-07-17 MED ORDER — MIDAZOLAM HCL 2 MG/2ML IJ SOLN
INTRAMUSCULAR | Status: DC | PRN
  Administered 2020-07-17: 2 mg via INTRAVENOUS

## 2020-07-17 SURGICAL SUPPLY — 63 items
ADH SKN CLS APL DERMABOND .7 (GAUZE/BANDAGES/DRESSINGS) ×4
APL PRP STRL LF DISP 70% ISPRP (MISCELLANEOUS)
ATTRACTOMAT 16X20 MAGNETIC DRP (DRAPES) ×4 IMPLANT
BLADE CLIPPER SURG (BLADE) IMPLANT
BLADE SURG 10 STRL SS (BLADE) ×4 IMPLANT
BLADE SURG 15 STRL LF DISP TIS (BLADE) ×3 IMPLANT
BLADE SURG 15 STRL SS (BLADE) ×8
CANISTER SUCT 3000ML PPV (MISCELLANEOUS) ×4 IMPLANT
CHLORAPREP W/TINT 10.5 ML (MISCELLANEOUS) ×4 IMPLANT
CHLORAPREP W/TINT 26 (MISCELLANEOUS) ×1 IMPLANT
CLIP VESOCCLUDE MED 24/CT (CLIP) ×4 IMPLANT
CLIP VESOCCLUDE MED 6/CT (CLIP) ×4 IMPLANT
CLIP VESOCCLUDE SM WIDE 24/CT (CLIP) ×4 IMPLANT
CLIP VESOCCLUDE SM WIDE 6/CT (CLIP) ×4 IMPLANT
CLOSURE WOUND 1/2 X4 (GAUZE/BANDAGES/DRESSINGS)
CNTNR URN SCR LID CUP LEK RST (MISCELLANEOUS) ×2 IMPLANT
CONT SPEC 4OZ STRL OR WHT (MISCELLANEOUS) ×4
COVER SURGICAL LIGHT HANDLE (MISCELLANEOUS) ×4 IMPLANT
DERMABOND ADVANCED (GAUZE/BANDAGES/DRESSINGS) ×4
DERMABOND ADVANCED .7 DNX12 (GAUZE/BANDAGES/DRESSINGS) ×2 IMPLANT
DRAPE LAPAROTOMY 100X72 PEDS (DRAPES) ×7 IMPLANT
DRAPE UTILITY XL STRL (DRAPES) ×10 IMPLANT
ELECT CAUTERY BLADE 6.4 (BLADE) ×4 IMPLANT
ELECT REM PT RETURN 9FT ADLT (ELECTROSURGICAL) ×4
ELECTRODE REM PT RTRN 9FT ADLT (ELECTROSURGICAL) ×2 IMPLANT
GAUZE 4X4 16PLY RFD (DISPOSABLE) ×10 IMPLANT
GAUZE SPONGE 2X2 8PLY STRL LF (GAUZE/BANDAGES/DRESSINGS) ×1 IMPLANT
GAUZE SPONGE 4X4 12PLY STRL (GAUZE/BANDAGES/DRESSINGS) ×4 IMPLANT
GLOVE SURG ORTHO 8.0 STRL STRW (GLOVE) ×4 IMPLANT
GOWN STRL REUS W/ TWL LRG LVL3 (GOWN DISPOSABLE) ×3 IMPLANT
GOWN STRL REUS W/ TWL XL LVL3 (GOWN DISPOSABLE) ×2 IMPLANT
GOWN STRL REUS W/TWL LRG LVL3 (GOWN DISPOSABLE) ×8
GOWN STRL REUS W/TWL XL LVL3 (GOWN DISPOSABLE) ×4
HEMOSTAT ARISTA ABSORB 3G PWDR (HEMOSTASIS) IMPLANT
HEMOSTAT SURGICEL 2X4 FIBR (HEMOSTASIS) ×4 IMPLANT
ILLUMINATOR WAVEGUIDE N/F (MISCELLANEOUS) ×4 IMPLANT
KIT BASIN OR (CUSTOM PROCEDURE TRAY) ×4 IMPLANT
KIT TURNOVER KIT B (KITS) ×4 IMPLANT
NDL HYPO 25GX1X1/2 BEV (NEEDLE) ×1 IMPLANT
NEEDLE HYPO 25GX1X1/2 BEV (NEEDLE) ×4 IMPLANT
NS IRRIG 1000ML POUR BTL (IV SOLUTION) ×4 IMPLANT
PACK SURGICAL SETUP 50X90 (CUSTOM PROCEDURE TRAY) ×4 IMPLANT
PAD ARMBOARD 7.5X6 YLW CONV (MISCELLANEOUS) ×4 IMPLANT
PENCIL BUTTON HOLSTER BLD 10FT (ELECTRODE) ×7 IMPLANT
POSITIONER HEAD DONUT 9IN (MISCELLANEOUS) ×4 IMPLANT
SHEARS HARMONIC 9CM CVD (BLADE) ×4 IMPLANT
SPECIMEN JAR MEDIUM (MISCELLANEOUS) ×24 IMPLANT
SPONGE GAUZE 2X2 STER 10/PKG (GAUZE/BANDAGES/DRESSINGS)
SPONGE INTESTINAL PEANUT (DISPOSABLE) ×4 IMPLANT
STRIP CLOSURE SKIN 1/2X4 (GAUZE/BANDAGES/DRESSINGS) ×1 IMPLANT
SUT MNCRL AB 4-0 PS2 18 (SUTURE) ×7 IMPLANT
SUT PROLENE 4 0 SH DA (SUTURE) ×3 IMPLANT
SUT SILK 2 0 (SUTURE) ×4
SUT SILK 2-0 18XBRD TIE 12 (SUTURE) ×2 IMPLANT
SUT SILK 3 0 (SUTURE)
SUT SILK 3-0 18XBRD TIE 12 (SUTURE) IMPLANT
SUT VIC AB 3-0 SH 18 (SUTURE) ×10 IMPLANT
SYR BULB EAR ULCER 3OZ GRN STR (SYRINGE) ×4 IMPLANT
SYR CONTROL 10ML LL (SYRINGE) ×4 IMPLANT
TOWEL GREEN STERILE (TOWEL DISPOSABLE) ×4 IMPLANT
TOWEL GREEN STERILE FF (TOWEL DISPOSABLE) ×4 IMPLANT
TUBE CONNECTING 12'X1/4 (SUCTIONS) ×1
TUBE CONNECTING 12X1/4 (SUCTIONS) ×3 IMPLANT

## 2020-07-17 NOTE — Progress Notes (Signed)
Mosetta Pigeon, RN called and made aware that the pt's HD treatment has been moved to 07/18/20.

## 2020-07-17 NOTE — Consult Note (Signed)
[facility] Renal Consultation Note    Indication for Consultation:  Management of ESRD/hemodialysis; anemia, hypertension/volume and secondary hyperparathyroidism  HPI: Dominique Lawrence is a 19 y.o. 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 She underwent planned parathyroidectomy per Dr. Harlow Asa this afternoon. Outpatient labs from 11/3 significant for Ca 10.1 Alb 4.2 Phos 9.6 PTH 2213.  Labs this am. Na 138, K 5.5, Hgb 9.9.  Nephrology consulted for dialysis needs.   Seen in PACU. Remains groggy from anesthesia. Due for routine dialysis today. Dialysis MWF at [facility]. Using LUE AVF. Last dialysis Friday 11/5. Has not missed any treatments.   Past Medical History:  Diagnosis Date  . Alport syndrome   . Anemia   . Chronic systolic CHF (congestive heart failure) ([place])   . Constipation 12/18/2013  . Depression 07/28/2013  . ESRD (end stage renal disease) ([place])     Transplant failed Hemo MWF at [facility].  Marland Kitchen Hearing loss    Bilateral hearing aids  . History of blood transfusion   . Hypertension   . Influenza B   . S/P wisdom tooth extraction 06/22/2016   Right mandibular dental pain  . Sinus tachycardia   . Wears glasses    Past Surgical History:  Procedure Laterality Date  . AV FISTULA PLACEMENT Left 02/10/2020   Procedure: ARTERIOVENOUS (AV) FISTULA CREATION;  Surgeon: Serafina Mitchell, MD;  Location: [place];  Service: Vascular;  Laterality: Left;  . CENTRAL VENOUS CATHETER INSERTION  10/08/2019   Procedure: Insertion Central Line Adult;  Surgeon: Prescott Gum, Collier Salina, MD;  Location: [place];  Service: Thoracic;;  . Hemodialysis catheter placed Right   . IR REMOVAL TUN CV CATH W/O FL  06/15/2020  . 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 NEPHRECTOMY Right   . RIGHT/LEFT HEART CATH AND CORONARY ANGIOGRAPHY N/A 10/07/2019   Procedure:  RIGHT/LEFT HEART CATH AND CORONARY ANGIOGRAPHY;  Surgeon: Jolaine Artist, MD;  Location: [place] CV LAB;  Service: Cardiovascular;  Laterality: N/A;  . SUBXYPHOID PERICARDIAL WINDOW N/A 10/08/2019   Procedure: SUBXYPHOID PERICARDIAL WINDOW;  Surgeon: Ivin Poot, MD;  Location: [place];  Service: Thoracic;  Laterality: N/A;  . TEE WITHOUT CARDIOVERSION N/A 10/08/2019   Procedure: TRANSESOPHAGEAL ECHOCARDIOGRAM (TEE);  Surgeon: Prescott Gum, Collier Salina, MD;  Location: [facility] OR;  Service: Thoracic;  Laterality: N/A;  . WISDOM TOOTH EXTRACTION     Family History  Problem Relation Age of Onset  . Alport syndrome Brother    Social History:  reports that she has never smoked. She has never used smokeless tobacco. She reports that she does not drink alcohol and does not use drugs. Allergies  Allergen Reactions  . Enalapril Other (See Comments)    AKI  . Chlorhexidine Gluconate Hives  . Ibuprofen Other (See Comments)    Pt not able to take ibuprofen due to Alport's syndrome   Prior to Admission medications   Medication Sig Start Date End Date Taking? Authorizing Provider  acetaminophen (TYLENOL) 500 MG tablet Take 500 mg by mouth every 6 (six) hours as needed for moderate pain or headache.   Yes [provider]  amLODipine (NORVASC) 10 MG tablet TAKE 1 TABLET(10 MG) BY MOUTH DAILY Patient taking differently: Take 10 mg by mouth daily.  05/23/20  Yes Josue Hector, MD  AURYXIA 1 GM 210 MG(Fe) tablet Take 420 mg by mouth 3 (three) times daily  with meals.  03/13/20  Yes [provider]  carvedilol (COREG) 25 MG tablet TAKE 1 TABLET(25 MG) BY MOUTH TWICE DAILY WITH A MEAL Patient taking differently: Take 25 mg by mouth 2 (two) times daily with a meal.  01/26/20  Yes Prescott Gum, Collier Salina, MD  etonogestrel (NEXPLANON) 68 MG IMPL implant 68 mg by Subdermal route once. Jan 2018   Yes [provider]  guaiFENesin (MUCINEX) 600 MG 12 hr tablet Take 1,200 mg by mouth 2 (two) times  daily as needed for cough or to loosen phlegm.   Yes [provider]  hydrALAZINE (APRESOLINE) 25 MG tablet Take 3 tablets (75 mg total) by mouth 3 (three) times daily. 10/19/19 07/11/20 Yes Clegg, Amy D, NP  isosorbide mononitrate (IMDUR) 30 MG 24 hr tablet Take 1 tablet (30 mg total) by mouth daily. 10/19/19 07/11/20 Yes Clegg, Amy D, NP  sacubitril-valsartan (ENTRESTO) 49-51 MG Take 1 tablet by mouth 2 (two) times daily. 06/01/20  Yes Bensimhon, Shaune Pascal, MD  heparin 1000 unit/mL SOLN injection Heparin Sodium (Porcine) 1,000 Units/mL Catheter Lock Venous 02/21/20 02/19/21  [provider]  Methoxy PEG-Epoetin Beta (MIRCERA IJ) Mircera 03/22/20 03/21/21  [provider]   Current Facility-Administered Medications  Medication Dose Route Frequency Provider Last Rate Last Admin  . 0.9 %  sodium chloride infusion   Intravenous Continuous Lillia Abed, MD   Stopped at 07/17/20 1323  . acetaminophen (OFIRMEV) IV 1,000 mg  1,000 mg Intravenous Once PRN Ellender, Karyl Kinnier, MD      . carvedilol (COREG) 12.5 MG tablet           . fentaNYL (SUBLIMAZE) injection 25-50 mcg  25-50 mcg Intravenous Q5 min PRN Ellender, Karyl Kinnier, MD      . oxyCODONE (Oxy IR/ROXICODONE) immediate release tablet 5 mg  5 mg Oral Once PRN Ellender, Karyl Kinnier, MD       Or  . oxyCODONE (ROXICODONE) 5 MG/5ML solution 5 mg  5 mg Oral Once PRN Ellender, Karyl Kinnier, MD      . promethazine (PHENERGAN) injection 6.25-12.5 mg  6.25-12.5 mg Intravenous Q15 min PRN Ellender, Karyl Kinnier, MD         ROS: As per HPI otherwise negative.  Physical Exam: Vitals:   07/17/20 0721  BP: (!) 164/106  Pulse: (!) 113  Resp: 18  Temp: 98.8 F (37.1 C)  TempSrc: Oral  SpO2: 100%  Weight: 56.5 kg  Height: 5\' 4"  (1.626 m)     General: Young female, nad  Head: NCAT sclera not icteric MMM Neck: Supple. No JVD appreciated. Surgical incision Lungs: CTA bilaterally without wheezes, rales, or rhonchi. Breathing is unlabored. Heart: RRR with S1  S2 Abdomen: soft non-tender  Lower extremities:without edema or ischemic changes, no open wounds  Neuro: Moves  extremities spontaneously. Psych: Sedated, post-op  Dialysis Access: LUE AVF +bruit   Labs: Basic Metabolic Panel: Recent Labs  Lab 07/11/20 1455 07/17/20 0821  NA 140 138  K 5.0 5.5*  CL 96* 97*  CO2 30  --   GLUCOSE 84 85  BUN 49* 66*  CREATININE 7.98* 8.90*  CALCIUM 10.1  --    Liver Function Tests: No results for input(s): AST, ALT, ALKPHOS, BILITOT, PROT, ALBUMIN in the last 168 hours. No results for input(s): LIPASE, AMYLASE in the last 168 hours. No results for input(s): AMMONIA in the last 168 hours. CBC: Recent Labs  Lab 07/11/20 1455 07/17/20 0821  WBC 8.8  --   HGB 9.9*  9.9*  HCT 32.6* 29.0*  MCV 101.2*  --   PLT 145*  --    Cardiac Enzymes: No results for input(s): CKTOTAL, CKMB, CKMBINDEX, TROPONINI in the last 168 hours. CBG: No results for input(s): GLUCAP in the last 168 hours. Iron Studies: No results for input(s): IRON, TIBC, TRANSFERRIN, FERRITIN in the last 72 hours. Studies/Results: No results found.  Dialysis Orders:  [facility] M[facility] 3h 45 min 350/600 EDW 53.5 kg 2K/2Ca  AVF No heparin  Mircera 75 q 2 weeks (to start 11/8) OP Labs 11/3: Ca 10.1 Alb 4.2 Phos 9.6 PTH 2213   Assessment/Plan: 1. 2HPTH s/p total PTHectomy with autotransplant. 4 glands removed per Dr. Harlow Asa.  2. ESRD -  HD MWF. Will write orders for HD today. Needs Covid swab before coming to the unit.  3. Hypertension/volume  - BP/volume ok. Continue home meds. UF to EDW as tolerated  4. Anemia  - Hgb 9.9. Due for ESA. Order Aranesp 60 with HD.  5. Metabolic bone disease -  S/p PTHectomy. Expect drop in Ca++ s/p PTHectomy. Will use added Ca++ bath, add Hectorol with HD here. Continue Auryixa binder when eating . Trend Ca++  6. Nutrition - Renal diet with fluid restriction, when eating.  7. R thyroid mass --will likely require lobectomy in the future for definitive  evaluation   Dohn Stclair Larina Earthly PA-C [facility] 07/17/2020, 1:51 PM

## 2020-07-17 NOTE — Anesthesia Postprocedure Evaluation (Signed)
Anesthesia Post Note  Patient: Dominique Lawrence  Procedure(s) Performed: TOTAL PARATHYROIDECTOMY WITH AUTOTRANSPLANT (Left Arm Lower)     Patient location during evaluation: PACU Anesthesia Type: General Level of consciousness: awake Pain management: pain level controlled Vital Signs Assessment: post-procedure vital signs reviewed and stable Respiratory status: spontaneous breathing, nonlabored ventilation, respiratory function stable and patient connected to nasal cannula oxygen Cardiovascular status: blood pressure returned to baseline and stable Postop Assessment: no apparent nausea or vomiting Anesthetic complications: no   No complications documented.  Last Vitals:  Vitals:   07/17/20 1747 07/17/20 2016  BP: 125/83 117/82  Pulse: 80 75  Resp: 20 18  Temp: 36.6 C 36.6 C  SpO2: 94% 100%    Last Pain:  Vitals:   07/17/20 2016  TempSrc: Oral  PainSc:                  Dominique Lawrence Dominique Lawrence

## 2020-07-17 NOTE — Interval H&P Note (Signed)
History and Physical Interval Note:  07/17/2020 8:27 AM  Dominique Lawrence  has presented today for surgery, with the diagnosis of SECONDARY HYPERPARATHYROIDISM.  The various methods of treatment have been discussed with the patient and family. After consideration of risks, benefits and other options for treatment, the patient has consented to    Procedure(s): TOTAL PARATHYROIDECTOMY WITH AUTOTRANSPLANT (N/A) RIGHT THYROID LOBECTOMY (Right) as a surgical intervention.    The patient's history has been reviewed, patient examined, no change in status, stable for surgery.  I have reviewed the patient's chart and labs.  Questions were answered to the patient's satisfaction.    Armandina Gemma, MD [facility] Office: [place]

## 2020-07-17 NOTE — Anesthesia Procedure Notes (Signed)
Procedure Name: Intubation Date/Time: 07/17/2020 9:46 AM Performed by: Lance Coon, CRNA Pre-anesthesia Checklist: Patient identified, Emergency Drugs available, Suction available, Patient being monitored and Timeout performed Patient Re-evaluated:Patient Re-evaluated prior to induction Oxygen Delivery Method: Circle system utilized Preoxygenation: Pre-oxygenation with 100% oxygen Induction Type: IV induction Ventilation: Mask ventilation without difficulty Laryngoscope Size: Miller and 2 Grade View: Grade I Tube type: Oral Tube size: 7.0 mm Number of attempts: 1 Airway Equipment and Method: Stylet Placement Confirmation: ETT inserted through vocal cords under direct vision,  positive ETCO2 and breath sounds checked- equal and bilateral Secured at: 21 cm Tube secured with: Tape Dental Injury: Teeth and Oropharynx as per pre-operative assessment

## 2020-07-17 NOTE — Transfer of Care (Signed)
Immediate Anesthesia Transfer of Care Note  Patient: Dominique Lawrence  Procedure(s) Performed: TOTAL PARATHYROIDECTOMY WITH AUTOTRANSPLANT (Left Arm Lower)  Patient Location: PACU  Anesthesia Type:General  Level of Consciousness: drowsy and patient cooperative  Airway & Oxygen Therapy: Patient Spontanous Breathing and Patient connected to face mask oxygen  Post-op Assessment: Report given to RN and Post -op Vital signs reviewed and stable  Post vital signs: Reviewed and stable  Last Vitals:  Vitals Value Taken Time  BP 125/81 07/17/20 1327  Temp    Pulse 86 07/17/20 1329  Resp 15 07/17/20 1329  SpO2 100 % 07/17/20 1329  Vitals shown include unvalidated device data.  Last Pain:  Vitals:   07/17/20 0824  TempSrc:   PainSc: 0-No pain         Complications: No complications documented.

## 2020-07-17 NOTE — Op Note (Signed)
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  Indications: Patient is a 19 year old female on hemodialysis with secondary hyperparathyroidism.  Patient comes to surgery for total parathyroidectomy with autotransplantation.  Prior to surgery she underwent an ultrasound examination of the neck which demonstrated a mass in the inferior right thyroid lobe.  Fine-needle aspiration biopsy was attempted twice without returning sufficient tissue for evaluation.  Patient will likely require right thyroid lobectomy for definitive evaluation.  Procedure:  The patient was seen in the pre-op holding area. The risks, benefits, complications, treatment options, and expected outcomes were previously discussed with the patient. The patient agreed with the proposed plan and has signed the informed consent form.  The patient was brought to the operating room by the surgical team, identified as Dominique Lawrence and the procedure verified. A "time out" was completed and the above information confirmed.  Following administration of general anesthesia, the patient is positioned and then prepped and draped in the usual aseptic fashion.  After ascertaining that an adequate level of anesthesia been achieved, a Kocher incision is made with a #15 blade.  Dissection was carried through subcutaneous tissues and the platysma is divided with the electrocautery.  Skin  flaps are elevated cephalad and caudad and a Mahorner self-retaining retractors placed for exposure.  Strap muscles were incised in the midline.  Strap muscles were reflected to the left exposing a normal left thyroid lobe.  Further dissection revealed an enlarged parathyroid gland in the left inferior position.  This was dissected out and vascular pedicle divided between small ligaclips.  The entire gland was excised.  Initial biopsy was read as thymic tissue.  However, on closer examination of the specimen, sectioning of the specimen, and submission of an additional sample, parathyroid tissue was identified.  Further dissection on the left side reveals an enlarged left superior parathyroid gland.  This gland is also dissected out and the vascular pedicle divided between small ligaclips.  Biopsy confirms parathyroid tissue.  Next we turned our attention to the right side of the neck.  Strap muscles are again reflected laterally.  And inferior parathyroid gland is again identified.  It is dissected out.  Vascular pedicles divided between ligaclips.  Biopsy is submitted to pathology and frozen section confirms parathyroid tissue.  Further dissection on the right reveals what appears to be a normal right thyroid lobe.  There is no evidence of significant nodularity of the right lobe of the gland.  However, there is a mass just posterior to the inferior pole of the right lobe of the thyroid which extends along the trachea towards the esophagus.  This mass is quite firm and fibrous.  With some difficulty it is mobilized.  Surrounding tissue planes appear dermoplastic,  fibrous, and quite firm.  With some difficulty and approximately 3 cm mass is resected and submitted to pathology.  Frozen section reveals parathyroid tissue however this tissue appears abnormal with multiple nests of parathyroid cells.  We discussed the possibility that this represents a parathyroid malignancy.  Further exploration in the neck  reveals no further abnormalities.  Neck is irrigated with warm saline and good hemostasis is achieved throughout the operative field.  Fibrillar is placed throughout the operative field.  Strap muscles are reapproximated in the midline of interrupted 3-0 Vicryl sutures.  Platysma was closed with interrupted 3-0 Vicryl sutures.  Skin is closed with a running 4-0 Monocryl subcuticular suture.  Wound is washed and dried and Dermabond is applied as dressing.  Next we turned to the left forearm for autotransplantation of parathyroid tissue to the brachial radialis muscle.  Arm is positioned on the arm board and then prepped and draped in the usual aseptic fashion.  After ascertaining that an adequate level of anesthesia had been maintained, an incision is made over the left brachial radialis muscle.  Dissection was carried through subcutaneous tissues and skin flaps are elevated circumferentially.  Small fragments of parathyroid tissue were taken from the left superior parathyroid gland and right inferior parathyroid gland.  In total, 8 fragments of tissue are reimplanted into the brachial radialis muscle by incising the overlying muscle fascia, creating a muscular pocket, inserting a fragment of parathyroid tissue, and closing the overlying fascia with a interrupted 4-0 Prolene suture.  Good hemostasis is noted.  Subcutaneous tissues are closed with interrupted 3-0 Vicryl sutures.  Skin is closed with a running 4-0 Monocryl subcuticular suture.  Wound is washed and dried and Dermabond is applied as dressing.  Patient is awakened from anesthesia and transported to the recovery room in stable condition.  The patient tolerated the procedure well.   Armandina Gemma, MD [facility] Office: [phone]

## 2020-07-17 NOTE — Anesthesia Procedure Notes (Signed)
Arterial Line Insertion Start/End11/04/2020 9:00 AM, 07/17/2020 9:10 AM Performed by: Lance Coon, CRNA, CRNA  Preanesthetic checklist: patient identified, IV checked, site marked, risks and benefits discussed, surgical consent, monitors and equipment checked, pre-op evaluation, timeout performed and anesthesia consent Lidocaine 1% used for infiltration Right, radial was placed Catheter size: 20 G Hand hygiene performed , maximum sterile barriers used  and Seldinger technique used  Attempts: 3 Procedure performed without using ultrasound guided technique. Following insertion, dressing applied and Biopatch. Post procedure assessment: normal and unchanged  Patient tolerated the procedure well with no immediate complications.

## 2020-07-18 ENCOUNTER — Encounter (HOSPITAL_COMMUNITY): Payer: Self-pay | Admitting: Surgery

## 2020-07-18 LAB — CBC
HCT: 29.8 % — ABNORMAL LOW (ref 36.0–46.0)
Hemoglobin: 9.1 g/dL — ABNORMAL LOW (ref 12.0–15.0)
MCH: 30.7 pg (ref 26.0–34.0)
MCHC: 30.5 g/dL (ref 30.0–36.0)
MCV: 100.7 fL — ABNORMAL HIGH (ref 80.0–100.0)
Platelets: 105 10*3/uL — ABNORMAL LOW (ref 150–400)
RBC: 2.96 MIL/uL — ABNORMAL LOW (ref 3.87–5.11)
RDW: 14.9 % (ref 11.5–15.5)
WBC: 6.3 10*3/uL (ref 4.0–10.5)
nRBC: 0 % (ref 0.0–0.2)

## 2020-07-18 LAB — RENAL FUNCTION PANEL
Albumin: 3.2 g/dL — ABNORMAL LOW (ref 3.5–5.0)
Anion gap: 17 — ABNORMAL HIGH (ref 5–15)
BUN: 63 mg/dL — ABNORMAL HIGH (ref 6–20)
CO2: 23 mmol/L (ref 22–32)
Calcium: 6.9 mg/dL — ABNORMAL LOW (ref 8.9–10.3)
Chloride: 96 mmol/L — ABNORMAL LOW (ref 98–111)
Creatinine, Ser: 9.55 mg/dL — ABNORMAL HIGH (ref 0.44–1.00)
GFR, Estimated: 6 mL/min — ABNORMAL LOW (ref >60)
Glucose, Bld: 97 mg/dL (ref 70–99)
Phosphorus: 8.5 mg/dL — ABNORMAL HIGH (ref 2.5–4.6)
Potassium: 6.5 mmol/L (ref 3.5–5.1)
Sodium: 136 mmol/L (ref 135–145)

## 2020-07-18 LAB — SURGICAL PATHOLOGY

## 2020-07-18 LAB — HEPATITIS B SURFACE ANTIGEN: Hepatitis B Surface Ag: NONREACTIVE

## 2020-07-18 MED ORDER — CALCITRIOL 0.5 MCG PO CAPS
1.0000 ug | ORAL_CAPSULE | Freq: Two times a day (BID) | ORAL | Status: DC
  Administered 2020-07-18: 1 ug via ORAL
  Filled 2020-07-18: qty 2

## 2020-07-18 MED ORDER — CALCITRIOL 0.5 MCG PO CAPS
ORAL_CAPSULE | ORAL | Status: AC
  Filled 2020-07-18: qty 2

## 2020-07-18 MED ORDER — CALCIUM CARBONATE ANTACID 500 MG PO CHEW
2.0000 | CHEWABLE_TABLET | Freq: Two times a day (BID) | ORAL | Status: DC
  Administered 2020-07-18: 400 mg via ORAL
  Filled 2020-07-18: qty 2

## 2020-07-18 MED ORDER — TRAMADOL HCL 50 MG PO TABS: 50.0000 mg | ORAL_TABLET | Freq: Four times a day (QID) | ORAL | 0 refills | Status: DC | PRN

## 2020-07-18 MED ORDER — DARBEPOETIN ALFA 60 MCG/0.3ML IJ SOSY: 60.0000 ug | PREFILLED_SYRINGE | INTRAMUSCULAR | Status: DC

## 2020-07-18 NOTE — Progress Notes (Signed)
     Assessment & Plan: POD#1 - status post neck exploration and total parathyroidectomy with autotransplantation  Four glands removed - reimplantation to left forearm  No thyroid neoplasm identified - no thyroid tissue removed  "Mass" on right was superior parathyroid gland - possibly neoplastic - await pathology  HD this AM per renal  Drop in calcium to 6.9 as expected - good sign  Home when stable from renal standpoint        Armandina Gemma, MD       [facility]       Office: [phone]   Chief Complaint: Secondary hyperparathyroidism  Subjective: Patient in HD this AM.  Doing well.  No complaints.  Wants to go home later today.  Objective: Vital signs in last 24 hours: Temp:  [97.6 F (36.4 C)-98.1 F (36.7 C)] 98.1 F (36.7 C) (11/09 0743) Pulse Rate:  [72-86] 75 (11/09 0743) Resp:  [15-23] 18 (11/09 0743) BP: (107-125)/(67-83) 109/73 (11/09 0743) SpO2:  [90 %-100 %] 90 % (11/09 0743) Last BM Date: 07/18/20  Intake/Output from previous day: 11/08 0701 - 11/09 0700 In: 750 [I.V.:500; IV Piggyback:250] Out: 50 [Blood:50] Intake/Output this shift: No intake/output data recorded.  Physical Exam: HEENT - sclerae clear, mucous membranes moist Neck - wound dry and intact; minimal STS; voice normal Chest - clear bilaterally Cor - RRR Ext - incision left forearm dry and intact; fistula functioning normally Neuro - alert & oriented, no focal deficits  Lab Results:  Recent Labs    07/17/20 0821  HGB 9.9*  HCT 29.0*   BMET Recent Labs    07/17/20 0821 07/18/20 0445  NA 138 136  K 5.5* 6.5*  CL 97* 96*  CO2  --  23  GLUCOSE 85 97  BUN 66* 63*  CREATININE 8.90* 9.55*  CALCIUM  --  6.9*   PT/INR No results for input(s): LABPROT, INR in the last 72 hours. Comprehensive Metabolic Panel:    Component Value Date/Time   NA 136 07/18/2020 0445   NA 138 07/17/2020 0821   K 6.5 (HH) 07/18/2020 0445   K 5.5 (H) 07/17/2020 0821   CL 96 (L)  07/18/2020 0445   CL 97 (L) 07/17/2020 0821   CO2 23 07/18/2020 0445   CO2 30 07/11/2020 1455   BUN 63 (H) 07/18/2020 0445   BUN 66 (H) 07/17/2020 0821   CREATININE 9.55 (H) 07/18/2020 0445   CREATININE 8.90 (H) 07/17/2020 0821   CREATININE 3.52 (H) 08/11/2017 1155   CREATININE 2.66 (H) 06/10/2017 1029   GLUCOSE 97 07/18/2020 0445   GLUCOSE 85 07/17/2020 0821   CALCIUM 6.9 (L) 07/18/2020 0445   CALCIUM 10.1 07/11/2020 1455   AST 22 01/25/2020 1505   AST 12 (L) 10/10/2019 0513   ALT 15 01/25/2020 1505   ALT 7 10/10/2019 0513   ALKPHOS 87 01/25/2020 1505   ALKPHOS 66 10/10/2019 0513   BILITOT 1.1 01/25/2020 1505   BILITOT 0.4 10/10/2019 0513   PROT 7.3 01/25/2020 1505   PROT 5.1 (L) 10/10/2019 0513   ALBUMIN 3.2 (L) 07/18/2020 0445   ALBUMIN 3.7 01/25/2020 1505    Studies/Results: No results found.    Armandina Gemma 07/18/2020  Patient ID: Dominique Lawrence, female   DOB: 01/02/2001, 19 y.o.   MRN: [id number]

## 2020-07-18 NOTE — Progress Notes (Signed)
Pt feels like she can go home tonight and it is OK with Dr. Harlow Asa and Renal.  DC instructions printed and copy given and pt understands follow up.

## 2020-07-18 NOTE — Progress Notes (Signed)
[facility] Progress Note   Subjective:   Seen on HD this AM - some forearm pain but no s/s hypocalcemia.   Objective Vitals:   07/18/20 0818 07/18/20 0830 07/18/20 0900 07/18/20 0930  BP: 117/60 (!) 122/58 (!) 115/57 123/66  Pulse: 79 77 72 76  Resp:      Temp:      TempSrc:      SpO2:      Weight:      Height:       Physical Exam General: appears comfortable Heart: RRR Lungs: clear Abdomen: soft Extremities: no edema Dialysis Access:  LUE AVF Qb 300   Additional Objective Labs: Basic Metabolic Panel: Recent Labs  Lab 07/11/20 1455 07/17/20 0821 07/18/20 0445  NA 140 138 136  K 5.0 5.5* 6.5*  CL 96* 97* 96*  CO2 30  --  23  GLUCOSE 84 85 97  BUN 49* 66* 63*  CREATININE 7.98* 8.90* 9.55*  CALCIUM 10.1  --  6.9*  PHOS  --   --  8.5*   Liver Function Tests: Recent Labs  Lab 07/18/20 0445  ALBUMIN 3.2*   No results for input(s): LIPASE, AMYLASE in the last 168 hours. CBC: Recent Labs  Lab 07/11/20 1455 07/17/20 0821 07/18/20 0833  WBC 8.8  --  6.3  HGB 9.9* 9.9* 9.1*  HCT 32.6* 29.0* 29.8*  MCV 101.2*  --  100.7*  PLT 145*  --  105*   Blood Culture    Component Value Date/Time   SDES TISSUE PERICARDIAL 10/08/2019 0837   SPECREQUEST NONE 10/08/2019 0837   CULT  10/08/2019 0837    No growth aerobically or anaerobically. Performed at [facility], [place] [address] [place], [place] [address]    REPTSTATUS 10/13/2019 FINAL 10/08/2019 0837    Cardiac Enzymes: No results for input(s): CKTOTAL, CKMB, CKMBINDEX, TROPONINI in the last 168 hours. CBG: No results for input(s): GLUCAP in the last 168 hours. Iron Studies: No results for input(s): IRON, TIBC, TRANSFERRIN, FERRITIN in the last 72 hours. @lablastinr3 @ Studies/Results: No results found. Medications: . sodium chloride    . sodium chloride    . sodium chloride     . amLODipine  10 mg Oral Daily  . carvedilol  25 mg Oral BID WC  . darbepoetin (ARANESP) injection -  DIALYSIS  60 mcg Intravenous Q Mon-HD  . [START ON 07/19/2020] doxercalciferol  2 mcg Intravenous Q M,W,F-HD  . ferric citrate  420 mg Oral TID WC  . hydrALAZINE  75 mg Oral TID  . isosorbide mononitrate  30 mg Oral Daily    Dialysis Orders:  [facility] M[facility] 3h 45 min 350/600 EDW 53.5 kg 2K/2Ca  AVF No heparin  Mircera 75 q 2 weeks (to start 11/8) OP Labs 11/3: Ca 10.1 Alb 4.2 Phos 9.6 PTH 2213   Assessment/Plan: 1. 2HPTH s/p total PTHectomy with autotransplant. 4 glands removed per Dr. Harlow Asa 11/8.  Ca down as expected today.   2. ESRD -  HD MWF. HD make up today for missed Monday due to large census.  Ok to resume usual schedule tomorrow.  3. Hypertension/volume  - BP/volume ok. Continue home meds. UF to EDW as tolerated  4. Anemia  - Hgb 9.9. Due for ESA. Order Aranesp 60 with HD.  5. Metabolic bone disease -  S/p PTHectomy. Expect drop in Ca++ s/p PTHectomy. Will use added Ca++ bath, add Hectorol with HD here. Continue Auryixa binder when eating . Discharge on calcitriol 1  BID and ca carbonate 1000 BID for now - needs to be taking on discharge.   6. Nutrition - Renal diet with fluid restriction, when eating.  7. R thyroid mass -- per surgery note - "mass" was superior parathyroid gland, poss neoplastic, path pending.  Dispo: ok to d/c from my perspective.   Jannifer Hick MD 07/18/2020, 9:47 AM  [facility] Pager: [phone]

## 2020-07-18 NOTE — Plan of Care (Signed)

## 2020-07-18 NOTE — Discharge Summary (Signed)
Physician Discharge Summary [facility]  Patient ID: Dominique Lawrence MRN: [id number] DOB/AGE: [birth year] 21 y.o.  Admit date: 07/17/2020 Discharge date: 07/18/2020  Admission Diagnoses:  Secondary hyperparathyroidism, ESRD, right neck mass  Discharge Diagnoses:  Principal Problem:   Secondary hyperparathyroidism of renal origin [facility]) Active Problems:   Right thyroid nodule   Neoplasm of uncertain behavior of thyroid gland   Discharged Condition: good  Hospital Course: Patient was admitted for observation following parathyroid surgery.  Post op course was uncomplicated.  Pain was well controlled.  Tolerated diet.    Patient was prepared for discharge home on POD#1 following hemodialysis and evaluation by the nephrology service.  Consults: nephrology  Treatments: surgery: neck exploration, total parathyroidectomy, autotransplantation of parathyroid tissue to left forearm  Discharge Exam: Blood pressure 124/77, pulse 86, temperature 98.2 F (36.8 C), temperature source Oral, resp. rate 16, height 5\' 4"  (1.626 m), weight 56.5 kg, SpO2 98 %. See progress note from earlier today  Disposition: Home  Discharge Instructions    Diet general   Complete by: As directed    Discharge instructions   Complete by: As directed    [facility]  THYROID & PARATHYROID SURGERY:  POST-OP INSTRUCTIONS  Always review your discharge instruction sheet from the facility where your surgery was performed.  A prescription for pain medication may be given to you upon discharge.  Take your pain medication as prescribed.  If narcotic pain medicine is not needed, then you may take acetaminophen (Tylenol) or ibuprofen (Advil) as needed.  Take your usually prescribed medications unless otherwise directed.  If you need a refill on your pain medication, please contact our office during regular business hours.  Prescriptions cannot be processed by our  office after 5 pm or on weekends.  Start with a light diet upon arrival home, such as soup and crackers or toast.  Be sure to drink plenty of fluids daily.  Resume your normal diet the day after surgery.  Most patients will experience some swelling and bruising on the chest and neck area.  Ice packs will help.  Swelling and bruising can take several days to resolve.   It is common to experience some constipation after surgery.  Increasing fluid intake and taking a stool softener (Colace) will usually help or prevent this problem.  A mild laxative (Milk of Magnesia or Miralax) should be taken according to package directions if there has been no bowel movement after 48 hours.  You have steri-strips and a gauze dressing over your incision.  You may remove the gauze bandage on the second day after surgery, and you may shower at that time.  Leave your steri-strips (small skin tapes) in place directly over the incision.  These strips should remain on the skin for 5-7 days and then be removed.  You may get them wet in the shower and pat them dry.  You may resume regular (light) daily activities beginning the next day (such as daily self-care, walking, climbing stairs) gradually increasing activities as tolerated.  You may have sexual intercourse when it is comfortable.  Refrain from any heavy lifting or straining until approved by your doctor.  You may drive when you no longer are taking prescription pain medication, you can comfortably wear a seatbelt, and you can safely maneuver your car and apply brakes.  You should see your doctor in the office for a follow-up appointment approximately three weeks after your surgery.  Make sure that you  call for this appointment within a day or two after you arrive home to insure a convenient appointment time.  WHEN TO CALL YOUR DOCTOR: -- Fever greater than 101.5 -- Inability to urinate -- Nausea and/or vomiting - persistent -- Extreme swelling or bruising --  Continued bleeding from incision -- Increased pain, redness, or drainage from the incision -- Difficulty swallowing or breathing -- Muscle cramping or spasms -- Numbness or tingling in hands or around lips  The clinic staff is available to answer your questions during regular business hours.  Please don't hesitate to call and ask to speak to one of the nurses if you have concerns.  Armandina Gemma, MD [facility] 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   amLODipine 10 MG tablet Commonly known as: NORVASC TAKE 1 TABLET(10 MG) BY MOUTH DAILY What changed: See the new instructions.   Auryxia 1 GM 210 MG(Fe) tablet Generic drug: ferric citrate Take 420 mg by mouth 3 (three) times daily with meals.   carvedilol 25 MG tablet Commonly known as: COREG TAKE 1 TABLET(25 MG) BY MOUTH TWICE DAILY WITH A MEAL What changed: See the new instructions.   Entresto 49-51 MG Generic drug: sacubitril-valsartan Take 1 tablet by mouth 2 (two) times daily.   guaiFENesin 600 MG 12 hr tablet Commonly known as: MUCINEX Take 1,200 mg by mouth 2 (two) times daily as needed for cough or to loosen phlegm.   heparin 1000 unit/mL Soln injection Heparin Sodium (Porcine) 1,000 Units/mL Catheter Lock Venous   hydrALAZINE 25 MG tablet Commonly known as: APRESOLINE Take 3 tablets (75 mg total) by mouth 3 (three) times daily.   isosorbide mononitrate 30 MG 24 hr tablet Commonly known as: IMDUR Take 1 tablet  (30 mg total) by mouth daily.   MIRCERA IJ Mircera   Nexplanon 68 MG Impl implant Generic drug: etonogestrel 68 mg by Subdermal route once. Jan 2018   traMADol 50 MG tablet Commonly known as: ULTRAM Take 1-2 tablets (50-100 mg total) by mouth every 6 (six) hours as needed for moderate pain or severe pain.            Discharge Care Instructions  (From admission, onward)         Start     Ordered   07/18/20 0000  No dressing needed        07/18/20 1749           Earnstine Regal, MD, [facility] Office: [phone]   Signed: Armandina Gemma 07/18/2020, 5:49 PM

## 2020-07-18 NOTE — Progress Notes (Signed)
CRITICAL VALUE ALERT  Critical Value:  Potassium 6.5 Date & Time Notied:  07/18/20 0600  Provider Notified: [facility]   Orders Received/Actions taken: not at this time informed going to HD in AM

## 2020-07-18 NOTE — Progress Notes (Signed)
Please contact patient and notify of benign pathology results.  Jamilla Galli M. Hassie Mandt, MD, FACS [facility] Office: [phone]   

## 2020-07-19 ENCOUNTER — Telehealth: Payer: Self-pay | Admitting: Nephrology

## 2020-07-19 NOTE — Telephone Encounter (Signed)
Transition of care contact from inpatient facility  Date of discharge: 07/18/20 Date of contact: 07/19/20 Method: Phone Spoke to: Patient  Patient contacted to discuss transition of care from recent inpatient hospitalization. Patient was admitted to [facility] from 11/8-11/9/21with discharge diagnosis of secondary hyperparathyroidism s/p total parathyroidectomy.   Medication changes were reviewed. She did not have calcitriol prescription - will send to her pharmacy.   Patient will follow up with his/her outpatient HD unit on: She went to dialysis today with no issues.

## 2020-07-21 ENCOUNTER — Other Ambulatory Visit: Payer: Self-pay

## 2020-07-21 ENCOUNTER — Inpatient Hospital Stay (HOSPITAL_COMMUNITY)
Admission: EM | Admit: 2020-07-21 | Discharge: 2020-07-25 | DRG: 640 | Disposition: A | Discharge status: Home / Self Care | Payer: Medicare Other | Attending: Internal Medicine | Admitting: Internal Medicine

## 2020-07-21 ENCOUNTER — Emergency Department (HOSPITAL_COMMUNITY): Payer: Medicare Other

## 2020-07-21 ENCOUNTER — Encounter (HOSPITAL_COMMUNITY): Payer: Self-pay | Admitting: Emergency Medicine

## 2020-07-21 DIAGNOSIS — I081 Rheumatic disorders of both mitral and tricuspid valves: Secondary | ICD-10-CM | POA: Diagnosis present

## 2020-07-21 DIAGNOSIS — Z992 Dependence on renal dialysis: Secondary | ICD-10-CM

## 2020-07-21 DIAGNOSIS — R079 Chest pain, unspecified: Secondary | ICD-10-CM | POA: Diagnosis present

## 2020-07-21 DIAGNOSIS — Z9489 Other transplanted organ and tissue status: Secondary | ICD-10-CM

## 2020-07-21 DIAGNOSIS — I5022 Chronic systolic (congestive) heart failure: Secondary | ICD-10-CM | POA: Diagnosis not present

## 2020-07-21 DIAGNOSIS — D440 Neoplasm of uncertain behavior of thyroid gland: Secondary | ICD-10-CM | POA: Diagnosis present

## 2020-07-21 DIAGNOSIS — H9193 Unspecified hearing loss, bilateral: Secondary | ICD-10-CM | POA: Diagnosis present

## 2020-07-21 DIAGNOSIS — N2581 Secondary hyperparathyroidism of renal origin: Secondary | ICD-10-CM | POA: Diagnosis present

## 2020-07-21 DIAGNOSIS — Z20822 Contact with and (suspected) exposure to covid-19: Secondary | ICD-10-CM | POA: Diagnosis present

## 2020-07-21 DIAGNOSIS — R072 Precordial pain: Secondary | ICD-10-CM

## 2020-07-21 DIAGNOSIS — R7989 Other specified abnormal findings of blood chemistry: Secondary | ICD-10-CM | POA: Diagnosis present

## 2020-07-21 DIAGNOSIS — E785 Hyperlipidemia, unspecified: Secondary | ICD-10-CM | POA: Diagnosis present

## 2020-07-21 DIAGNOSIS — F32A Depression, unspecified: Secondary | ICD-10-CM | POA: Diagnosis present

## 2020-07-21 DIAGNOSIS — R778 Other specified abnormalities of plasma proteins: Secondary | ICD-10-CM | POA: Diagnosis present

## 2020-07-21 DIAGNOSIS — R188 Other ascites: Secondary | ICD-10-CM | POA: Diagnosis not present

## 2020-07-21 DIAGNOSIS — E875 Hyperkalemia: Secondary | ICD-10-CM

## 2020-07-21 DIAGNOSIS — R202 Paresthesia of skin: Secondary | ICD-10-CM | POA: Diagnosis present

## 2020-07-21 DIAGNOSIS — Z8616 Personal history of COVID-19: Secondary | ICD-10-CM

## 2020-07-21 DIAGNOSIS — N186 End stage renal disease: Secondary | ICD-10-CM | POA: Diagnosis present

## 2020-07-21 DIAGNOSIS — Z9089 Acquired absence of other organs: Secondary | ICD-10-CM

## 2020-07-21 DIAGNOSIS — R9431 Abnormal electrocardiogram [ECG] [EKG]: Secondary | ICD-10-CM

## 2020-07-21 DIAGNOSIS — R0789 Other chest pain: Secondary | ICD-10-CM | POA: Diagnosis present

## 2020-07-21 DIAGNOSIS — Y830 Surgical operation with transplant of whole organ as the cause of abnormal reaction of the patient, or of later complication, without mention of misadventure at the time of the procedure: Secondary | ICD-10-CM | POA: Diagnosis present

## 2020-07-21 DIAGNOSIS — E892 Postprocedural hypoparathyroidism: Secondary | ICD-10-CM | POA: Diagnosis present

## 2020-07-21 DIAGNOSIS — I10 Essential (primary) hypertension: Secondary | ICD-10-CM | POA: Diagnosis present

## 2020-07-21 DIAGNOSIS — D631 Anemia in chronic kidney disease: Secondary | ICD-10-CM | POA: Diagnosis present

## 2020-07-21 DIAGNOSIS — I071 Rheumatic tricuspid insufficiency: Secondary | ICD-10-CM | POA: Diagnosis present

## 2020-07-21 DIAGNOSIS — I132 Hypertensive heart and chronic kidney disease with heart failure and with stage 5 chronic kidney disease, or end stage renal disease: Secondary | ICD-10-CM | POA: Diagnosis present

## 2020-07-21 DIAGNOSIS — R519 Headache, unspecified: Secondary | ICD-10-CM | POA: Diagnosis present

## 2020-07-21 DIAGNOSIS — T8612 Kidney transplant failure: Secondary | ICD-10-CM | POA: Diagnosis present

## 2020-07-21 DIAGNOSIS — Z79899 Other long term (current) drug therapy: Secondary | ICD-10-CM

## 2020-07-21 DIAGNOSIS — E8889 Other specified metabolic disorders: Secondary | ICD-10-CM | POA: Diagnosis present

## 2020-07-21 DIAGNOSIS — Q8781 Alport syndrome: Secondary | ICD-10-CM | POA: Diagnosis not present

## 2020-07-21 DIAGNOSIS — Z888 Allergy status to other drugs, medicaments and biological substances status: Secondary | ICD-10-CM

## 2020-07-21 DIAGNOSIS — M79606 Pain in leg, unspecified: Secondary | ICD-10-CM | POA: Diagnosis present

## 2020-07-21 DIAGNOSIS — Z886 Allergy status to analgesic agent status: Secondary | ICD-10-CM

## 2020-07-21 DIAGNOSIS — I272 Pulmonary hypertension, unspecified: Secondary | ICD-10-CM | POA: Diagnosis present

## 2020-07-21 LAB — I-STAT CHEM 8, ED
BUN: 56 mg/dL — ABNORMAL HIGH (ref 6–20)
Calcium, Ion: 0.53 mmol/L — CL (ref 1.15–1.40)
Chloride: 94 mmol/L — ABNORMAL LOW (ref 98–111)
Creatinine, Ser: 8.2 mg/dL — ABNORMAL HIGH (ref 0.44–1.00)
Glucose, Bld: 78 mg/dL (ref 70–99)
HCT: 32 % — ABNORMAL LOW (ref 36.0–46.0)
Hemoglobin: 10.9 g/dL — ABNORMAL LOW (ref 12.0–15.0)
Potassium: 6.1 mmol/L — ABNORMAL HIGH (ref 3.5–5.1)
Sodium: 133 mmol/L — ABNORMAL LOW (ref 135–145)
TCO2: 25 mmol/L (ref 22–32)

## 2020-07-21 LAB — I-STAT BETA HCG BLOOD, ED (MC, WL, AP ONLY): I-stat hCG, quantitative: <5 m[IU]/mL

## 2020-07-21 LAB — RENAL FUNCTION PANEL
Albumin: 3.2 g/dL — ABNORMAL LOW (ref 3.5–5.0)
Anion gap: 17 — ABNORMAL HIGH (ref 5–15)
BUN: 53 mg/dL — ABNORMAL HIGH (ref 6–20)
CO2: 24 mmol/L (ref 22–32)
Calcium: 5.6 mg/dL — CL (ref 8.9–10.3)
Chloride: 92 mmol/L — ABNORMAL LOW (ref 98–111)
Creatinine, Ser: 7.86 mg/dL — ABNORMAL HIGH (ref 0.44–1.00)
GFR, Estimated: 7 mL/min — ABNORMAL LOW (ref >60)
Glucose, Bld: 77 mg/dL (ref 70–99)
Phosphorus: 5.9 mg/dL — ABNORMAL HIGH (ref 2.5–4.6)
Potassium: 6 mmol/L — ABNORMAL HIGH (ref 3.5–5.1)
Sodium: 133 mmol/L — ABNORMAL LOW (ref 135–145)

## 2020-07-21 LAB — HEPATIC FUNCTION PANEL
ALT: 11 U/L (ref 0–44)
AST: 31 U/L (ref 15–41)
Albumin: 3.4 g/dL — ABNORMAL LOW (ref 3.5–5.0)
Alkaline Phosphatase: 177 U/L — ABNORMAL HIGH (ref 38–126)
Bilirubin, Direct: 0.4 mg/dL — ABNORMAL HIGH (ref 0.0–0.2)
Indirect Bilirubin: 0.8 mg/dL (ref 0.3–0.9)
Total Bilirubin: 1.2 mg/dL (ref 0.3–1.2)
Total Protein: 6.8 g/dL (ref 6.5–8.1)

## 2020-07-21 LAB — BASIC METABOLIC PANEL
Anion gap: 18 — ABNORMAL HIGH (ref 5–15)
BUN: 51 mg/dL — ABNORMAL HIGH (ref 6–20)
CO2: 23 mmol/L (ref 22–32)
Calcium: 5.2 mg/dL — CL (ref 8.9–10.3)
Chloride: 94 mmol/L — ABNORMAL LOW (ref 98–111)
Creatinine, Ser: 7.94 mg/dL — ABNORMAL HIGH (ref 0.44–1.00)
GFR, Estimated: 7 mL/min — ABNORMAL LOW (ref >60)
Glucose, Bld: 82 mg/dL (ref 70–99)
Potassium: 6.2 mmol/L — ABNORMAL HIGH (ref 3.5–5.1)
Sodium: 135 mmol/L (ref 135–145)

## 2020-07-21 LAB — CBC WITH DIFFERENTIAL/PLATELET
Abs Immature Granulocytes: 0.06 10*3/uL (ref 0.00–0.07)
Basophils Absolute: 0.1 10*3/uL (ref 0.0–0.1)
Basophils Relative: 1 %
Eosinophils Absolute: 0.1 10*3/uL (ref 0.0–0.5)
Eosinophils Relative: 1 %
HCT: 33.2 % — ABNORMAL LOW (ref 36.0–46.0)
Hemoglobin: 10.5 g/dL — ABNORMAL LOW (ref 12.0–15.0)
Immature Granulocytes: 1 %
Lymphocytes Relative: 23 %
Lymphs Abs: 2.2 10*3/uL (ref 0.7–4.0)
MCH: 31.2 pg (ref 26.0–34.0)
MCHC: 31.6 g/dL (ref 30.0–36.0)
MCV: 98.5 fL (ref 80.0–100.0)
Monocytes Absolute: 0.8 10*3/uL (ref 0.1–1.0)
Monocytes Relative: 8 %
Neutro Abs: 6.7 10*3/uL (ref 1.7–7.7)
Neutrophils Relative %: 66 %
Platelets: 175 10*3/uL (ref 150–400)
RBC: 3.37 MIL/uL — ABNORMAL LOW (ref 3.87–5.11)
RDW: 15.1 % (ref 11.5–15.5)
WBC: 9.9 10*3/uL (ref 4.0–10.5)
nRBC: 0 % (ref 0.0–0.2)

## 2020-07-21 LAB — CBC
HCT: 31.1 % — ABNORMAL LOW (ref 36.0–46.0)
Hemoglobin: 9.7 g/dL — ABNORMAL LOW (ref 12.0–15.0)
MCH: 31.3 pg (ref 26.0–34.0)
MCHC: 31.2 g/dL (ref 30.0–36.0)
MCV: 100.3 fL — ABNORMAL HIGH (ref 80.0–100.0)
Platelets: 158 10*3/uL (ref 150–400)
RBC: 3.1 MIL/uL — ABNORMAL LOW (ref 3.87–5.11)
RDW: 15.2 % (ref 11.5–15.5)
WBC: 10.3 10*3/uL (ref 4.0–10.5)
nRBC: 0 % (ref 0.0–0.2)

## 2020-07-21 LAB — LIPASE, BLOOD: Lipase: 23 U/L (ref 11–51)

## 2020-07-21 LAB — TROPONIN I (HIGH SENSITIVITY)
Troponin I (High Sensitivity): 103 ng/L
Troponin I (High Sensitivity): 116 ng/L (ref <18)

## 2020-07-21 LAB — MRSA PCR SCREENING: MRSA by PCR: NEGATIVE

## 2020-07-21 LAB — RESPIRATORY PANEL BY RT PCR (FLU A&B, COVID)
Influenza A by PCR: NEGATIVE
Influenza B by PCR: NEGATIVE
SARS Coronavirus 2 by RT PCR: NEGATIVE

## 2020-07-21 LAB — CBG MONITORING, ED: Glucose-Capillary: 78 mg/dL (ref 70–99)

## 2020-07-21 MED ORDER — HEPARIN SODIUM (PORCINE) 5000 UNIT/ML IJ SOLN
5000.0000 [IU] | Freq: Three times a day (TID) | INTRAMUSCULAR | Status: DC
  Administered 2020-07-21 – 2020-07-24 (×9): 5000 [IU] via SUBCUTANEOUS
  Filled 2020-07-21 (×11): qty 1

## 2020-07-21 MED ORDER — CALCIUM CARBONATE ANTACID 500 MG PO CHEW
2.0000 | CHEWABLE_TABLET | Freq: Three times a day (TID) | ORAL | Status: DC
  Administered 2020-07-21 – 2020-07-24 (×10): 400 mg via ORAL
  Filled 2020-07-21 (×11): qty 2

## 2020-07-21 MED ORDER — AMLODIPINE BESYLATE 10 MG PO TABS
10.0000 mg | ORAL_TABLET | Freq: Every day | ORAL | Status: DC
  Administered 2020-07-22 – 2020-07-25 (×4): 10 mg via ORAL
  Filled 2020-07-21 (×4): qty 1

## 2020-07-21 MED ORDER — ISOSORBIDE MONONITRATE ER 30 MG PO TB24
30.0000 mg | ORAL_TABLET | Freq: Every day | ORAL | Status: DC
  Administered 2020-07-22 – 2020-07-25 (×4): 30 mg via ORAL
  Filled 2020-07-21 (×4): qty 1

## 2020-07-21 MED ORDER — CARVEDILOL 25 MG PO TABS
25.0000 mg | ORAL_TABLET | Freq: Two times a day (BID) | ORAL | Status: DC
  Administered 2020-07-22 – 2020-07-25 (×6): 25 mg via ORAL
  Filled 2020-07-21 (×6): qty 1

## 2020-07-21 MED ORDER — CHLORHEXIDINE GLUCONATE CLOTH 2 % EX PADS: 6.0000 | MEDICATED_PAD | Freq: Every day | CUTANEOUS | Status: DC

## 2020-07-21 MED ORDER — CALCITRIOL 1 MCG/ML IV SOLN
1.0000 ug | Freq: Two times a day (BID) | INTRAVENOUS | Status: DC
  Administered 2020-07-21 (×2): 1 ug via INTRAVENOUS
  Filled 2020-07-21 (×3): qty 1

## 2020-07-21 MED ORDER — CALCIUM CARBONATE 1250 (500 CA) MG PO TABS: 1.0000 | ORAL_TABLET | Freq: Two times a day (BID) | ORAL | Status: DC

## 2020-07-21 MED ORDER — LIDOCAINE-PRILOCAINE 2.5-2.5 % EX CREA: 1.0000 "application " | TOPICAL_CREAM | CUTANEOUS | Status: DC | PRN

## 2020-07-21 MED ORDER — IOHEXOL 350 MG/ML SOLN
100.0000 mL | Freq: Once | INTRAVENOUS | Status: AC | PRN
  Administered 2020-07-21: 70 mL via INTRAVENOUS

## 2020-07-21 MED ORDER — SACUBITRIL-VALSARTAN 49-51 MG PO TABS
1.0000 | ORAL_TABLET | Freq: Two times a day (BID) | ORAL | Status: DC
  Filled 2020-07-21 (×2): qty 1

## 2020-07-21 MED ORDER — CALCIUM GLUCONATE-NACL 1-0.675 GM/50ML-% IV SOLN
1.0000 g | Freq: Once | INTRAVENOUS | Status: AC
  Filled 2020-07-21: qty 50

## 2020-07-21 MED ORDER — CALCIUM GLUCONATE-NACL 1-0.675 GM/50ML-% IV SOLN
INTRAVENOUS | Status: AC
  Administered 2020-07-21: 1000 mg via INTRAVENOUS
  Filled 2020-07-21: qty 50

## 2020-07-21 MED ORDER — TRAMADOL HCL 50 MG PO TABS: 50.0000 mg | ORAL_TABLET | Freq: Four times a day (QID) | ORAL | Status: DC | PRN

## 2020-07-21 MED ORDER — FERRIC CITRATE 1 GM 210 MG(FE) PO TABS
420.0000 mg | ORAL_TABLET | Freq: Three times a day (TID) | ORAL | Status: DC
  Administered 2020-07-22 – 2020-07-25 (×10): 420 mg via ORAL
  Filled 2020-07-21 (×10): qty 2

## 2020-07-21 MED ORDER — SODIUM CHLORIDE 0.9 % IV SOLN: 100.0000 mL | INTRAVENOUS | Status: DC | PRN

## 2020-07-21 MED ORDER — CALCIUM GLUCONATE-NACL 1-0.675 GM/50ML-% IV SOLN
1.0000 g | Freq: Once | INTRAVENOUS | Status: AC
  Administered 2020-07-21: 1000 mg via INTRAVENOUS

## 2020-07-21 MED ORDER — ACETAMINOPHEN 500 MG PO TABS
500.0000 mg | ORAL_TABLET | Freq: Four times a day (QID) | ORAL | Status: DC | PRN
  Administered 2020-07-22: 500 mg via ORAL
  Filled 2020-07-21 (×2): qty 1

## 2020-07-21 MED ORDER — HEPARIN SODIUM (PORCINE) 1000 UNIT/ML DIALYSIS: 1000.0000 [IU] | INTRAMUSCULAR | Status: DC | PRN

## 2020-07-21 MED ORDER — ALTEPLASE 2 MG IJ SOLR: 2.0000 mg | Freq: Once | INTRAMUSCULAR | Status: DC | PRN

## 2020-07-21 MED ORDER — HYDRALAZINE HCL 50 MG PO TABS
100.0000 mg | ORAL_TABLET | Freq: Three times a day (TID) | ORAL | Status: DC
  Administered 2020-07-22 – 2020-07-25 (×9): 100 mg via ORAL
  Filled 2020-07-21 (×10): qty 2

## 2020-07-21 MED ORDER — LIDOCAINE HCL (PF) 1 % IJ SOLN: 5.0000 mL | INTRAMUSCULAR | Status: DC | PRN

## 2020-07-21 MED ORDER — MORPHINE SULFATE (PF) 4 MG/ML IV SOLN
4.0000 mg | Freq: Once | INTRAVENOUS | Status: AC
  Administered 2020-07-21: 4 mg via INTRAVENOUS
  Filled 2020-07-21: qty 1

## 2020-07-21 MED ORDER — PENTAFLUOROPROP-TETRAFLUOROETH EX AERO: 1.0000 "application " | INHALATION_SPRAY | CUTANEOUS | Status: DC | PRN

## 2020-07-21 NOTE — H&P (Signed)
History and Physical    Dominique Lawrence HFW:[id number] DOB: [birth year] DOA: 07/21/2020  PCP: Carmie End, MD Patient coming from: home  Chief Complaint:  Chest pain, arms and legs tingling  HPI: Dominique Lawrence is a 19 y.o. female with medical history significant of Alport syndrome, ESRD on HD MWF, secondary hyperparathyroidism, chronic systolic CHF, HTN, HLD, depression, who presented with chest pain, arms and legs tingling.  Patient has a history of secondary hyperparathyroidism and underwent "neck exploration, total parathyroidectomy, autotransplantation of parathyroid tissue to left forearm" on 07/17/20. Postoperatively, she reports that she was doing fine and compliant with her HD sessions.  Patient reports that she started to have chest pain, arms and legs tingling last night.  Her chest pain was located to midsternal chest area, pressure-like pain, moderate pain with no radiation.  She states that her chest pain was constant with supine position, and intermittent with sitting or standing position.  The duration of her chest pain lasted for few hours.  Patient also reports bilateral arms, hands, legs tingling with generalized weakness.  Denies fevers, chills, shortness of breath, cough, wheezing, nausea, vomiting, diarrhea, abdominal pain, dysuria or urinary frequency or urgency.  Denies headaches, blurry vision or seizure-like activities.  Patient came to ED today for further evaluation.  Patient states that she is unvaccinated against Covid.  In the emergency room, she was afebrile with pulse 78, RR 18, BP 139/103 and room air O2 sats 100%.  Labs showed sodium 135, potassium 6.2, BUN 51, creatinine 7.94, calcium 5.2, ionized calcium 0.53, phosphorus 5.9, glucose 82, negative hCG test, negative Covid, negative influenza.  EKG showed sinus rhythm with prolonged QTC 543, CTA chest showed no PE. CT abdomen and Pelvis showed large ascites new since 2019.   Nephrology was consulted.  Patient received calcium gluconate 1 g IV x2, calcium carbonate 400 mg p.o. x1,calcitriol 49mcg/ml IV x 1 at the ED.    Review of Systems: As per HPI otherwise 10 point review of systems negative.  Review of Systems Otherwise negative except as per HPI, including: General: Denies fever, chills, night sweats or unintended weight loss. Resp: Denies cough, wheezing, shortness of breath. Cardiac: Denies palpitations, orthopnea, paroxysmal nocturnal dyspnea.  Positive for chest pain/pressure GI: Denies abdominal pain, nausea, vomiting, diarrhea or constipation GU: Denies dysuria, frequency, hesitancy or incontinence MS: Denies muscle aches, joint pain or swelling Neuro: Denies headache, neurologic deficits (focal weakness, numbness), abnormal gait.  Positive for arms hands legs tingling.  Positive for generalized weakness Psych: Denies anxiety, depression, SI/HI/AVH Skin: Denies new rashes or lesions ID: Denies sick contacts, exotic exposures, travel  Past Medical History:  Diagnosis Date  . Alport syndrome   . Anemia   . Chronic systolic CHF (congestive heart failure) ([place])   . Constipation 12/18/2013  . Depression 07/28/2013  . ESRD (end stage renal disease) ([place])     Transplant failed Hemo MWF at [facility].  Marland Kitchen Hearing loss    Bilateral hearing aids  . History of blood transfusion   . Hypertension   . Influenza B   . S/P wisdom tooth extraction 06/22/2016   Right mandibular dental pain  . Sinus tachycardia   . Wears glasses     Past Surgical History:  Procedure Laterality Date  . AV FISTULA PLACEMENT Left 02/10/2020   Procedure: ARTERIOVENOUS (AV) FISTULA CREATION;  Surgeon: Serafina Mitchell, MD;  Location: [place];  Service: Vascular;  Laterality: Left;  . CENTRAL VENOUS CATHETER INSERTION  10/08/2019   Procedure: Insertion Central Line Adult;  Surgeon: Prescott Gum, Collier Salina, MD;  Location: [place];  Service: Thoracic;;  . Hemodialysis  catheter placed Right   . IR REMOVAL TUN CV CATH W/O FL  06/15/2020  . 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 NEPHRECTOMY Right   . PARATHYROIDECTOMY  07/17/2020   TOTAL PARATHYROIDECTOMY WITH AUTOTRANSPLANT (N/A  . PARATHYROIDECTOMY Left 07/17/2020   Procedure: TOTAL PARATHYROIDECTOMY WITH AUTOTRANSPLANT;  Surgeon: Armandina Gemma, MD;  Location: [place];  Service: General;  Laterality: Left;  Left arm is location of autoransplant  . RIGHT/LEFT HEART CATH AND CORONARY ANGIOGRAPHY N/A 10/07/2019   Procedure: RIGHT/LEFT HEART CATH AND CORONARY ANGIOGRAPHY;  Surgeon: Jolaine Artist, MD;  Location: [place] [facility];  Service: Cardiovascular;  Laterality: N/A;  . SUBXYPHOID PERICARDIAL WINDOW N/A 10/08/2019   Procedure: SUBXYPHOID PERICARDIAL WINDOW;  Surgeon: Ivin Poot, MD;  Location: [place];  Service: Thoracic;  Laterality: N/A;  . TEE WITHOUT CARDIOVERSION N/A 10/08/2019   Procedure: TRANSESOPHAGEAL ECHOCARDIOGRAM (TEE);  Surgeon: Prescott Gum, Collier Salina, MD;  Location: [place];  Service: Thoracic;  Laterality: N/A;  . WISDOM TOOTH EXTRACTION      SOCIAL HISTORY:  reports that she has never smoked. She has never used smokeless tobacco. She reports that she does not drink alcohol and does not use drugs.  Allergies  Allergen Reactions  . Enalapril Other (See Comments)    AKI  . Chlorhexidine Gluconate Hives  . Ibuprofen Other (See Comments)    Pt not able to take ibuprofen due to Alport's syndrome    FAMILY HISTORY: Family History  Problem Relation Age of Onset  . Alport syndrome Brother      Prior to Admission medications   Medication Sig Start Date End Date Taking? Authorizing Provider  acetaminophen (TYLENOL) 500 MG tablet Take 500 mg by mouth every 6 (six) hours as needed for moderate pain or headache.   Yes [provider]  amLODipine (NORVASC) 10 MG tablet TAKE 1 TABLET(10 MG) BY MOUTH DAILY Patient taking differently: Take 10 mg by mouth daily.  05/23/20  Yes Josue Hector,  MD  AURYXIA 1 GM 210 MG(Fe) tablet Take 420 mg by mouth 3 (three) times daily with meals.  03/13/20  Yes [provider]  calcitRIOL (ROCALTROL) 0.5 MCG capsule Take 1 capsule by mouth in the morning and at bedtime. 07/19/20  Yes [provider]  calcium carbonate (OS-CAL - DOSED IN MG OF ELEMENTAL CALCIUM) 1250 (500 Ca) MG tablet Take 1 tablet by mouth in the morning and at bedtime. 07/19/20  Yes [provider]  carvedilol (COREG) 25 MG tablet TAKE 1 TABLET(25 MG) BY MOUTH TWICE DAILY WITH A MEAL Patient taking differently: Take 25 mg by mouth 2 (two) times daily with a meal.  01/26/20  Yes Prescott Gum, Collier Salina, MD  etonogestrel (NEXPLANON) 68 MG IMPL implant 68 mg by Subdermal route once. Jan 2018   Yes [provider]  hydrALAZINE (APRESOLINE) 100 MG tablet Take 100 mg by mouth 3 (three) times daily. 07/14/20  Yes [provider]  hydrALAZINE (APRESOLINE) 25 MG tablet Take 3 tablets (75 mg total) by mouth 3 (three) times daily. 10/19/19 07/21/20 Yes Clegg, Amy D, NP  isosorbide mononitrate (IMDUR) 30 MG 24 hr tablet Take 1 tablet (30 mg total) by mouth daily. 10/19/19 07/21/20 Yes Clegg, Amy D, NP  Methoxy PEG-Epoetin Beta (MIRCERA IJ) See admin instructions. Pt receives at [facility] on [address]. Pt receives a  dose based on labs during visit. 03/22/20 03/21/21 Yes [provider]  sacubitril-valsartan (ENTRESTO) 49-51 MG Take 1 tablet by mouth 2 (two) times daily. 06/01/20  Yes Bensimhon, Shaune Pascal, MD  traMADol (ULTRAM) 50 MG tablet Take 1-2 tablets (50-100 mg total) by mouth every 6 (six) hours as needed for moderate pain or severe pain. 07/18/20  Yes Armandina Gemma, MD  heparin 1000 unit/mL SOLN injection Heparin Sodium (Porcine) 1,000 Units/mL Catheter Lock Venous 02/21/20 02/19/21  [provider]    Physical Exam: Vitals:   07/21/20 1200 07/21/20 1232 07/21/20 1300 07/21/20 1311  BP: (!) 143/101 (!) 144/105 (!) 156/114 (!)  156/114  Pulse: 77 78 77 75  Resp: (!) 27 (!) 25 18 (!) 25  Temp:      TempSrc:      SpO2: 100% 100% 100% 100%  Weight:      Height:          Constitutional: NAD, calm, comfortable Eyes: PERRL, lids and conjunctivae normal ENMT: Mucous membranes are moist. Posterior pharynx clear of any exudate or lesions.Normal dentition.  Neck: normal, supple, no masses, no thyromegaly Respiratory: clear to auscultation bilaterally, no wheezing, no crackles. Normal respiratory effort. No accessory muscle use.  Cardiovascular: Regular rate and rhythm, no murmurs / rubs / gallops. No extremity edema. 2+ pedal pulses. No carotid bruits.  Abdomen: no tenderness, no masses palpated. No hepatosplenomegaly. Bowel sounds positive.  Musculoskeletal: no clubbing / cyanosis. No joint deformity upper and lower extremities. Good ROM, no contractures. Normal muscle tone.  Skin: no rashes, lesions, ulcers. No induration. Anterior neck surgical wound intact. Left arm surgical wound intact.  Both wounds without erythema, swelling, TTP warm to touch.  No drainage. Neurologic: CN 2-12 grossly intact. Sensation intact, DTR normal. Strength 5/5 in all 4. Positive for B/L Chvostek's sign  Psychiatric: Normal judgment and insight. Alert and oriented x 3. Normal mood.     Labs on Admission: I have personally reviewed following labs and imaging studies  CBC: Recent Labs  Lab 07/17/20 0821 07/18/20 0833 07/21/20 0846 07/21/20 0930  WBC  --  6.3 10.3  --   HGB 9.9* 9.1* 9.7* 10.9*  HCT 29.0* 29.8* 31.1* 32.0*  MCV  --  100.7* 100.3*  --   PLT  --  105* 158  --    Basic Metabolic Panel: Recent Labs  Lab 07/17/20 0821 07/18/20 0445 07/21/20 0846 07/21/20 0930 07/21/20 1025  Shauni Henner 138 136 135 133* 133*  K 5.5* 6.5* 6.2* 6.1* 6.0*  CL 97* 96* 94* 94* 92*  CO2  --  23 23  --  24  GLUCOSE 85 97 82 78 77  BUN 66* 63* 51* 56* 53*  CREATININE 8.90* 9.55* 7.94* 8.20* 7.86*  CALCIUM  --  6.9* 5.2*  --  5.6*  PHOS   --  8.5*  --   --  5.9*   GFR: Estimated Creatinine Clearance: 9.8 mL/min (A) (by C-G formula based on SCr of 7.86 mg/dL (H)). Liver Function Tests: Recent Labs  Lab 07/18/20 0445 07/21/20 0846 07/21/20 1025  AST  --  31  --   ALT  --  11  --   ALKPHOS  --  177*  --   BILITOT  --  1.2  --   PROT  --  6.8  --   ALBUMIN 3.2* 3.4* 3.2*   Recent Labs  Lab 07/21/20 0846  LIPASE 23   No results for input(s): AMMONIA in the last 168 hours.  Coagulation Profile: No results for input(s): INR, PROTIME in the last 168 hours. Cardiac Enzymes: No results for input(s): CKTOTAL, CKMB, CKMBINDEX, TROPONINI in the last 168 hours. BNP (last 3 results) No results for input(s): PROBNP in the last 8760 hours. HbA1C: No results for input(s): HGBA1C in the last 72 hours. CBG: Recent Labs  Lab 07/21/20 0858  GLUCAP 78   Lipid Profile: No results for input(s): CHOL, HDL, LDLCALC, TRIG, CHOLHDL, LDLDIRECT in the last 72 hours. Thyroid Function Tests: No results for input(s): TSH, T4TOTAL, FREET4, T3FREE, THYROIDAB in the last 72 hours. Anemia Panel: No results for input(s): VITAMINB12, FOLATE, FERRITIN, TIBC, IRON, RETICCTPCT in the last 72 hours. Urine analysis:    Component Value Date/Time   COLORURINE STRAW (A) 02/26/2019 1404   APPEARANCEUR CLEAR 02/26/2019 1404   LABSPEC 1.008 02/26/2019 1404   PHURINE 9.0 (H) 02/26/2019 1404   GLUCOSEU 50 (A) 02/26/2019 1404   HGBUR SMALL (A) 02/26/2019 1404   BILIRUBINUR NEGATIVE 02/26/2019 1404   BILIRUBINUR neg 08/11/2017 1139   KETONESUR NEGATIVE 02/26/2019 1404   PROTEINUR 100 (A) 02/26/2019 1404   UROBILINOGEN negative (A) 08/11/2017 1139   UROBILINOGEN 0.2 02/26/2015 1600   NITRITE NEGATIVE 02/26/2019 1404   LEUKOCYTESUR SMALL (A) 02/26/2019 1404   Sepsis Labs: !!!!!!!!!!!!!!!!!!!!!!!!!!!!!!!!!!!!!!!!!!!! @LABRCNTIP (procalcitonin:4,lacticidven:4) ) Recent Results (from the past 240 hour(s))  SARS CORONAVIRUS 2 (TAT 6-24 HRS)  Nasopharyngeal Nasopharyngeal Swab     Status: None   Collection Time: 07/13/20  2:28 PM   Specimen: Nasopharyngeal Swab  Result Value Ref Range Status   SARS Coronavirus 2 NEGATIVE NEGATIVE Final    Comment: (NOTE) SARS-CoV-2 target nucleic acids are NOT DETECTED.  The SARS-CoV-2 RNA is generally detectable in upper and lower respiratory specimens during the acute phase of infection. Negative results do not preclude SARS-CoV-2 infection, do not rule out co-infections with other pathogens, and should not be used as the sole basis for treatment or other patient management decisions. Negative results must be combined with clinical observations, patient history, and epidemiological information. The expected result is Negative.  Fact Sheet for Patients: SugarRoll.be  Fact Sheet for Healthcare Providers: https://www.woods-mathews.com/  This test is not yet approved or cleared by the [place] FDA and  has been authorized for detection and/or diagnosis of SARS-CoV-2 by FDA under an Emergency Use Authorization (EUA). This EUA will remain  in effect (meaning this test can be used) for the duration of the COVID-19 declaration under Se ction 564(b)(1) of the Act, 21 U.S.C. section 360bbb-3(b)(1), unless the authorization is terminated or revoked sooner.  Performed at [facility], [place] [address], [place], [place] [address]   Respiratory Panel by RT PCR (Flu A&B, Covid) - Nasopharyngeal Swab     Status: None   Collection Time: 07/17/20  2:39 PM   Specimen: Nasopharyngeal Swab  Result Value Ref Range Status   SARS Coronavirus 2 by RT PCR NEGATIVE NEGATIVE Final    Comment: (NOTE) SARS-CoV-2 target nucleic acids are NOT DETECTED.  The SARS-CoV-2 RNA is generally detectable in upper respiratoy specimens during the acute phase of infection. The lowest concentration of SARS-CoV-2 viral copies this assay can detect is 131 copies/mL. A  negative result does not preclude SARS-Cov-2 infection and should not be used as the sole basis for treatment or other patient management decisions. A negative result may occur with  improper specimen collection/handling, submission of specimen other than nasopharyngeal swab, presence of viral mutation(s) within the areas targeted by this assay, and inadequate number  of viral copies (<131 copies/mL). A negative result must be combined with clinical observations, patient history, and epidemiological information. The expected result is Negative.  Fact Sheet for Patients:  PinkCheek.be  Fact Sheet for Healthcare Providers:  GravelBags.it  This test is no t yet approved or cleared by the [place] FDA and  has been authorized for detection and/or diagnosis of SARS-CoV-2 by FDA under an Emergency Use Authorization (EUA). This EUA will remain  in effect (meaning this test can be used) for the duration of the COVID-19 declaration under Section 564(b)(1) of the Act, 21 U.S.C. section 360bbb-3(b)(1), unless the authorization is terminated or revoked sooner.     Influenza A by PCR NEGATIVE NEGATIVE Final   Influenza B by PCR NEGATIVE NEGATIVE Final    Comment: (NOTE) The Xpert Xpress SARS-CoV-2/FLU/RSV assay is intended as an aid in  the diagnosis of influenza from Nasopharyngeal swab specimens and  should not be used as a sole basis for treatment. Nasal washings and  aspirates are unacceptable for Xpert Xpress SARS-CoV-2/FLU/RSV  testing.  Fact Sheet for Patients: PinkCheek.be  Fact Sheet for Healthcare Providers: GravelBags.it  This test is not yet approved or cleared by the [place] FDA and  has been authorized for detection and/or diagnosis of SARS-CoV-2 by  FDA under an Emergency Use Authorization (EUA). This EUA will remain  in effect (meaning this test can  be used) for the duration of the  Covid-19 declaration under Section 564(b)(1) of the Act, 21  U.S.C. section 360bbb-3(b)(1), unless the authorization is  terminated or revoked. Performed at [facility], [place] [address], [place], [place] [address]   Respiratory Panel by RT PCR (Flu A&B, Covid) - Nasopharyngeal Swab     Status: None   Collection Time: 07/21/20  9:21 AM   Specimen: Nasopharyngeal Swab  Result Value Ref Range Status   SARS Coronavirus 2 by RT PCR NEGATIVE NEGATIVE Final    Comment: (NOTE) SARS-CoV-2 target nucleic acids are NOT DETECTED.  The SARS-CoV-2 RNA is generally detectable in upper respiratoy specimens during the acute phase of infection. The lowest concentration of SARS-CoV-2 viral copies this assay can detect is 131 copies/mL. A negative result does not preclude SARS-Cov-2 infection and should not be used as the sole basis for treatment or other patient management decisions. A negative result may occur with  improper specimen collection/handling, submission of specimen other than nasopharyngeal swab, presence of viral mutation(s) within the areas targeted by this assay, and inadequate number of viral copies (<131 copies/mL). A negative result must be combined with clinical observations, patient history, and epidemiological information. The expected result is Negative.  Fact Sheet for Patients:  PinkCheek.be  Fact Sheet for Healthcare Providers:  GravelBags.it  This test is no t yet approved or cleared by the [place] FDA and  has been authorized for detection and/or diagnosis of SARS-CoV-2 by FDA under an Emergency Use Authorization (EUA). This EUA will remain  in effect (meaning this test can be used) for the duration of the COVID-19 declaration under Section 564(b)(1) of the Act, 21 U.S.C. section 360bbb-3(b)(1), unless the authorization is terminated or revoked sooner.      Influenza A by PCR NEGATIVE NEGATIVE Final   Influenza B by PCR NEGATIVE NEGATIVE Final    Comment: (NOTE) The Xpert Xpress SARS-CoV-2/FLU/RSV assay is intended as an aid in  the diagnosis of influenza from Nasopharyngeal swab specimens and  should not be used as a sole basis for treatment. Nasal washings  and  aspirates are unacceptable for Xpert Xpress SARS-CoV-2/FLU/RSV  testing.  Fact Sheet for Patients: PinkCheek.be  Fact Sheet for Healthcare Providers: GravelBags.it  This test is not yet approved or cleared by the [place] FDA and  has been authorized for detection and/or diagnosis of SARS-CoV-2 by  FDA under an Emergency Use Authorization (EUA). This EUA will remain  in effect (meaning this test can be used) for the duration of the  Covid-19 declaration under Section 564(b)(1) of the Act, 21  U.S.C. section 360bbb-3(b)(1), unless the authorization is  terminated or revoked. Performed at [facility], [place] [address], [place], [place] [address]      Radiological Exams on Admission: CT Angio Chest PE W and/or Wo Contrast  Result Date: 07/21/2020 CLINICAL DATA:  Upper chest heaviness since last night EXAM: CT ANGIOGRAPHY CHEST WITH CONTRAST TECHNIQUE: Multidetector CT imaging of the chest was performed using the standard protocol during bolus administration of intravenous contrast. Multiplanar CT image reconstructions and MIPs were obtained to evaluate the vascular anatomy. CONTRAST:  70 cc Omnipaque 350 intravenous COMPARISON:  None. FINDINGS: Cardiovascular: Cardiomegaly without pericardial effusion. No pulmonary artery filling defect. Limited opacification of the left heart and aorta. Mediastinum/Nodes: No adenopathy, gas, or collection. Postoperative swelling and gas in the lower neck correlating with recent parathyroidectomy history. No visible tracheal narrowing Lungs/Pleura: Hazy density of the bilateral lungs  with immediate subpleural sparing. This appearance improves on the subsequent abdomen and pelvis CT and is likely from expiratory phase. No septal lines, effusion, or pneumothorax. Upper Abdomen: Reported separately Musculoskeletal: Generalized bony sclerosis with subchondral erosions at least at the medial clavicles. No acute finding Review of the MIP images confirms the above findings. IMPRESSION: 1. Negative for pulmonary embolism. 2. Recent parathyroidectomy with partially covered site. No visible airway narrowing. 3. Cardiomegaly. 4. Renal osteodystrophy. Electronically Signed   By: Monte Fantasia M.D.   On: 07/21/2020 10:24   CT ABDOMEN PELVIS W CONTRAST  Result Date: 07/21/2020 CLINICAL DATA:  Bowel obstruction suspected. Concern for ileus after recent surgery EXAM: CT ABDOMEN AND PELVIS WITH CONTRAST TECHNIQUE: Multidetector CT imaging of the abdomen and pelvis was performed using the standard protocol following bolus administration of intravenous contrast. CONTRAST:  91mL OMNIPAQUE IOHEXOL 350 MG/ML SOLN COMPARISON:  10/23/2017 FINDINGS: Lower chest:  Reported separately Hepatobiliary: Heterogeneous density of the liver. There is intrahepatic venous reflux and passive congestion is suspected. Small cystic density in the inferior right lobe. No evidence of biliary obstruction or stone. Pancreas: Unremarkable. Spleen: Unremarkable. Adrenals/Urinary Tract: Negative adrenals. Severe native renal atrophy. 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 Small right renal cystic density. Unremarkable bladder. Stomach/Bowel: No visible bowel obstruction or inflammation. No ileus. No appendicitis. Vascular/Lymphatic: No acute vascular abnormality. Postoperative distortion of the right external iliac artery related transplant. No mass or adenopathy. Reproductive:No pathologic findings. Other: Large volume ascites without visible loculation or peritoneal  nodularity. No Tenckhoff catheter in place. Musculoskeletal: Renal osteodystrophy with diffuse sclerosis of bones and bilateral sacroiliac subchondral erosion. IMPRESSION: 1. Large volume ascites, new from 2019. 2. Heterogeneous liver density, suspect passive congestion. 3. No bowel obstruction or ileus. Electronically Signed   By: Monte Fantasia M.D.   On: 07/21/2020 10:27     All images have been reviewed by me personally.  EKG: Independently reviewed.   Assessment/Plan Active Problems:   Alport's syndrome   Elevated troponin   Hypertension   ESRD (end stage renal disease) on dialysis (  [place])   Secondary hyperparathyroidism of renal origin ([place])   Chest pain   Prolonged QT interval   Chronic systolic CHF (congestive heart failure) ([facility])   Hypocalcemia   H/O parathyroidectomy ([place])   Hyperkalemia   Ascites   Assessment  plan  # Chest pain #Elevated troponin #Prolonged QTC # Hx of chronic systolic CHF  Echo 3/53/61 -LVEF 25-30% -Severe MR and TR -RV systolic function moderately reduced - mild pulmonary HTN   LHC 10/07/19 - normal coronary arteries   -Patient reports chest pain/chest pressure with atypical features since last night.  Her symptoms improved after receiving calcium infusion at ED. she is currently pain-free.  Recent echo and cardiac cath in 2021 reviewed, and she has normal coronary arteries. -Troponins are mildly elevated and flat, likely demand supply in the setting of poor renal clearance from ESRD -EKG showed normal sinus rhythm with prolonged QTC 543, which is related to hypocalcemia.  Follow-up EKG in the morning -Telemetry monitoring -Continue to correct electrolyte derangement.   #Symptomatic hypocalcemia # recent total parathyroidectomy and autotransplantation of parathyroid tissue to left forearm on 07/17/20 #History of secondary hyperparathyroidism   -Patient reports chest pain, arms/hands and legs tingling -She is noted to have Chvostek's signon  exam - Ca 5.2, ionized calcium 0.53 on admission - IPTH is pending - C/W consistent with symptomatic hypocalcemia -Patient received calcium gluconate 1 g IV x2, calcium carbonate 400 mg p.o. x1,calcitriol 66mcg/ml IV x 1 at the ED.  -Her symptom improved after receiving calcium -Nephrology is consulted.  Patient is placed on calcitriol 75mcg/ml IV BID -Appreciate nephrology input   #Hyperkalemia  # ESRD on HD MWF #History of Alport syndrome  - K 6.2 - she is due for HD today. Nephrology is consulted  #Large ascites, new from 2019 # Heterogeneous liver density, suspect passive congestion  Echo 01/25/20 -LVEF 25-30% -Severe MR and TR -RV systolic function moderately reduced - mild pulmonary HTN   - based on her echo findings on 01/25/2020 and CT A/P this admission.  The etiology of her ascites likely related to chronic heart failure.  She will have dialysis today and we will reassess her ascites status in the morning to see if she still has ascites for paracentesis  #HTN and HLD  -chronic and stable  #Depression  -chronic and at baseline   Body mass index is 20.6 kg/m.        DVT prophylaxis:  Heparin SQ Code Status: Full code Family Communication: husband at bedside  Consults called: nephrology Admission status: obs  Status is: Observation  The patient remains OBS appropriate and will d/c before 2 midnights.  Dispo: The patient is from: Home              Anticipated d/c is to: Home              Anticipated d/c date is: 2 days              Patient currently is not medically stable to d/c.       Time Spent: 65 minutes.  >50% of the time was devoted to discussing the patients care, assessment, plan and disposition with other care givers along with counseling the patient about the risks and benefits of treatment.    Charlann Lange MD [facility]  If 7PM-7AM, please contact night-coverage   07/21/2020, 1:30 PM

## 2020-07-21 NOTE — ED Notes (Signed)
Patient transported to CT 

## 2020-07-21 NOTE — ED Notes (Signed)
Attempted to give report and was told the nurse wasn't aware she was getting a pt and that she'll call back.

## 2020-07-21 NOTE — ED Provider Notes (Signed)
[facility] EMERGENCY DEPARTMENT Provider Note   CSN: [id number] Arrival date & time: 07/21/20  4098     History Chief Complaint  Patient presents with  . Chest Pain  . Post-op Problem    Dominique Lawrence is a 19 y.o. female.  Patient has complaints of abdominal pain chest pain difficulty breathing.  Sudden onset last night gradually worsening.  She has a history of end-stage renal disease currently on dialysis, compliant with her Monday Wednesday Friday treatments.  Also has a recent surgery of parathyroidectomy, history of hypocalcemia.  She has difficulty giving more history but no fevers chills no nausea vomiting.        Past Medical History:  Diagnosis Date  . Alport syndrome   . Anemia   . Chronic systolic CHF (congestive heart failure) ([place])   . Constipation 12/18/2013  . Depression 07/28/2013  . 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  Marland Kitchen Hearing loss    Bilateral hearing aids  . History of blood transfusion   . Hypertension   . Influenza B   . S/P wisdom tooth extraction 06/22/2016   Right mandibular dental pain  . Sinus tachycardia   . Wears glasses     Patient Active Problem List   Diagnosis Date Noted  . Right thyroid nodule 07/15/2020  . 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 Pericardial effusion 10/07/2019  . Secondary hyperparathyroidism of renal origin ([place]) 09/15/2019  . Dyspnea 08/23/2019  . COVID-19 virus infection 08/23/2019  . Tachycardia 08/23/2019  . Elevated troponin 08/23/2019  . Hypertension 08/23/2019  . ESRD (end stage renal disease) on dialysis ([place]) 08/23/2019  . Severe anemia 02/26/2019  . Nexplanon in place 11/23/2017  . Avitaminosis D 12/23/2015  . Acne vulgaris 09/25/2015  . Kidney cysts 09/13/2015  . Renal insufficiency 09/12/2015  . Constipation 07/07/2015  . Hearing loss 03/01/2015  . Failed  vision screen 09/14/2014  . Chronic kidney disease (CKD) stage G1/A1, glomerular filtration rate (GFR) equal to or greater than 90 mL/min/1.73 square meter and albuminuria creatinine ratio less than 30 mg/g 11/13/2013  . 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  . 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 Overweight, pediatric, BMI 85.0-94.9 percentile for age 32/19/2014    Past Surgical History:  Procedure Laterality Date  . AV FISTULA PLACEMENT Left 02/10/2020   Procedure: ARTERIOVENOUS (AV) FISTULA CREATION;  Surgeon: Serafina Mitchell, MD;  Location: [place];  Service: Vascular;  Laterality: Left;  . CENTRAL VENOUS CATHETER INSERTION  10/08/2019   Procedure: Insertion Central Line Adult;  Surgeon: Prescott Gum, Collier Salina, MD;  Location: [place];  Service: Thoracic;;  . Hemodialysis catheter placed Right   . IR REMOVAL TUN CV CATH W/O FL  06/15/2020  . 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 NEPHRECTOMY Right   . PARATHYROIDECTOMY  07/17/2020   TOTAL PARATHYROIDECTOMY WITH AUTOTRANSPLANT (N/A  . PARATHYROIDECTOMY Left 07/17/2020   Procedure: TOTAL PARATHYROIDECTOMY WITH AUTOTRANSPLANT;  Surgeon: Armandina Gemma, MD;  Location: [place];  Service: General;  Laterality: Left;  Left arm is location of autoransplant  . RIGHT/LEFT HEART CATH AND CORONARY ANGIOGRAPHY N/A 10/07/2019   Procedure: RIGHT/LEFT HEART CATH AND CORONARY ANGIOGRAPHY;  Surgeon: Jolaine Artist, MD;  Location: [place] CV LAB;  Service: Cardiovascular;  Laterality: N/A;  . SUBXYPHOID PERICARDIAL WINDOW N/A 10/08/2019   Procedure: SUBXYPHOID PERICARDIAL WINDOW;  Surgeon: Ivin Poot, MD;  Location: [facility]  OR;  Service: Thoracic;  Laterality: N/A;  . TEE WITHOUT CARDIOVERSION N/A 10/08/2019   Procedure: TRANSESOPHAGEAL ECHOCARDIOGRAM (TEE);  Surgeon: Prescott Gum, Collier Salina, MD;  Location: [place];  Service: Thoracic;  Laterality: N/A;  . WISDOM TOOTH EXTRACTION       OB History   No obstetric history on file.     Family History  Problem Relation Age of  Onset  . Alport syndrome Brother     Social History   Tobacco Use  . Smoking status: Never Smoker  . Smokeless tobacco: Never Used  Vaping Use  . Vaping Use: Never used  Substance Use Topics  . Alcohol use: No  . Drug use: No    Home Medications Prior to Admission medications   Medication Sig Start Date End Date Taking? Authorizing Provider  acetaminophen (TYLENOL) 500 MG tablet Take 500 mg by mouth every 6 (six) hours as needed for moderate pain or headache.   Yes [provider]  amLODipine (NORVASC) 10 MG tablet TAKE 1 TABLET(10 MG) BY MOUTH DAILY Patient taking differently: Take 10 mg by mouth daily.  05/23/20  Yes Josue Hector, MD  AURYXIA 1 GM 210 MG(Fe) tablet Take 420 mg by mouth 3 (three) times daily with meals.  03/13/20  Yes [provider]  calcitRIOL (ROCALTROL) 0.5 MCG capsule Take 1 capsule by mouth in the morning and at bedtime. 07/19/20  Yes [provider]  calcium carbonate (OS-CAL - DOSED IN MG OF ELEMENTAL CALCIUM) 1250 (500 Ca) MG tablet Take 1 tablet by mouth in the morning and at bedtime. 07/19/20  Yes [provider]  carvedilol (COREG) 25 MG tablet TAKE 1 TABLET(25 MG) BY MOUTH TWICE DAILY WITH A MEAL Patient taking differently: Take 25 mg by mouth 2 (two) times daily with a meal.  01/26/20  Yes Prescott Gum, Collier Salina, MD  etonogestrel (NEXPLANON) 68 MG IMPL implant 68 mg by Subdermal route once. Jan 2018   Yes [provider]  hydrALAZINE (APRESOLINE) 100 MG tablet Take 100 mg by mouth 3 (three) times daily. 07/14/20  Yes [provider]  hydrALAZINE (APRESOLINE) 25 MG tablet Take 3 tablets (75 mg total) by mouth 3 (three) times daily. 10/19/19 07/21/20 Yes Clegg, Amy D, NP  isosorbide mononitrate (IMDUR) 30 MG 24 hr tablet Take 1 tablet (30 mg total) by mouth daily. 10/19/19 07/21/20 Yes Clegg, Amy D, NP  Methoxy PEG-Epoetin Beta (MIRCERA IJ) See admin instructions. Pt receives at [facility] on [address]. Pt receives a dose based on labs during visit. 03/22/20 03/21/21 Yes [provider]  sacubitril-valsartan (ENTRESTO) 49-51 MG Take 1 tablet by mouth 2 (two) times daily. 06/01/20  Yes Bensimhon, Shaune Pascal, MD  traMADol (ULTRAM) 50 MG tablet Take 1-2 tablets (50-100 mg total) by mouth every 6 (six) hours as needed for moderate pain or severe pain. 07/18/20  Yes Armandina Gemma, MD  heparin 1000 unit/mL SOLN injection Heparin Sodium (Porcine) 1,000 Units/mL Catheter Lock Venous 02/21/20 02/19/21  [provider]    Allergies    Enalapril, Chlorhexidine gluconate, and Ibuprofen  Review of Systems   Review of Systems  Unable to perform ROS: Acuity of condition  Respiratory: Positive for shortness of breath.   Cardiovascular: Positive for chest pain.  Gastrointestinal: Positive for abdominal distention and abdominal pain.    Physical Exam Updated Vital Signs BP (!) 138/98 (BP Location: Right Arm)   Pulse 79   Temp 98.2 F (36.8 C) (Oral)   Resp Marland Kitchen)  28   Ht 5\' 4"  (1.626 m)   Wt 54.4 kg   SpO2 100%   BMI 20.60 kg/m   Physical Exam Vitals and nursing note reviewed. Exam conducted with a chaperone present.  Constitutional:      General: She is not in acute distress.    Appearance: Normal appearance.  HENT:     Head: Normocephalic and atraumatic.     Nose: No rhinorrhea.  Eyes:     General:        Right eye: No discharge.        Left eye: No discharge.     Conjunctiva/sclera: Conjunctivae normal.  Neck:   Cardiovascular:     Rate and Rhythm: Normal rate and regular rhythm.     Heart sounds: No murmur heard.   Pulmonary:     Effort: Pulmonary effort is normal. No respiratory distress.     Breath sounds: No stridor.  Abdominal:     General: Abdomen is flat. There is distension.     Palpations: Abdomen is soft.     Tenderness: There is abdominal tenderness.     Comments: Large right lower quadrant scar  Musculoskeletal:        General: No tenderness or  signs of injury.  Skin:    General: Skin is warm and dry.       Neurological:     General: No focal deficit present.     Mental Status: She is alert. Mental status is at baseline.     Motor: No weakness.  Psychiatric:        Mood and Affect: Mood normal.        Behavior: Behavior normal.     ED Results / Procedures / Treatments   Labs (all labs ordered are listed, but only abnormal results are displayed) Labs Reviewed  BASIC METABOLIC PANEL - Abnormal; Notable for the following components:      Result Value   Potassium 6.2 (*)    Chloride 94 (*)    BUN 51 (*)    Creatinine, Ser 7.94 (*)    Calcium 5.2 (*)    GFR, Estimated 7 (*)    Anion gap 18 (*)    All other components within normal limits  CBC - Abnormal; Notable for the following components:   RBC 3.10 (*)    Hemoglobin 9.7 (*)    HCT 31.1 (*)    MCV 100.3 (*)    All other components within normal limits  HEPATIC FUNCTION PANEL - Abnormal; Notable for the following components:   Albumin 3.4 (*)    Alkaline Phosphatase 177 (*)    Bilirubin, Direct 0.4 (*)    All other components within normal limits  I-STAT CHEM 8, ED - Abnormal; Notable for the following components:   Sodium 133 (*)    Potassium 6.1 (*)    Chloride 94 (*)    BUN 56 (*)    Creatinine, Ser 8.20 (*)    Calcium, Ion 0.53 (*)    Hemoglobin 10.9 (*)    HCT 32.0 (*)    All other components within normal limits  TROPONIN I (HIGH SENSITIVITY) - Abnormal; Notable for the following components:   Troponin I (High Sensitivity) 103 (*)    All other components within normal limits  RESPIRATORY PANEL BY RT PCR (FLU A&B, COVID)  LIPASE, BLOOD  RENAL FUNCTION PANEL  I-STAT BETA HCG BLOOD, ED ([facility], [facility], [facility] ONLY)  CBG MONITORING, ED  I-STAT CHEM 8, ED  TROPONIN I (HIGH SENSITIVITY)    EKG EKG Interpretation  Date/Time:  Friday July 21 2020 08:53:04 EST Ventricular Rate:  79 PR Interval:    QRS Duration: 96 QT Interval:  473 QTC  Calculation: 543 R Axis:   72 Text Interpretation: Sinus rhythm Prolonged QT interval Confirmed by Dewaine Conger [phone]) on 07/21/2020 9:12:29 AM   Radiology CT Angio Chest PE W and/or Wo Contrast  Result Date: 07/21/2020 CLINICAL DATA:  Upper chest heaviness since last night EXAM: CT ANGIOGRAPHY CHEST WITH CONTRAST TECHNIQUE: Multidetector CT imaging of the chest was performed using the standard protocol during bolus administration of intravenous contrast. Multiplanar CT image reconstructions and MIPs were obtained to evaluate the vascular anatomy. CONTRAST:  70 cc Omnipaque 350 intravenous COMPARISON:  None. FINDINGS: Cardiovascular: Cardiomegaly without pericardial effusion. No pulmonary artery filling defect. Limited opacification of the left heart and aorta. Mediastinum/Nodes: No adenopathy, gas, or collection. Postoperative swelling and gas in the lower neck correlating with recent parathyroidectomy history. No visible tracheal narrowing Lungs/Pleura: Hazy density of the bilateral lungs with immediate subpleural sparing. This appearance improves on the subsequent abdomen and pelvis CT and is likely from expiratory phase. No septal lines, effusion, or pneumothorax. Upper Abdomen: Reported separately Musculoskeletal: Generalized bony sclerosis with subchondral erosions at least at the medial clavicles. No acute finding Review of the MIP images confirms the above findings. IMPRESSION: 1. Negative for pulmonary embolism. 2. Recent parathyroidectomy with partially covered site. No visible airway narrowing. 3. Cardiomegaly. 4. Renal osteodystrophy. Electronically Signed   By: Monte Fantasia M.D.   On: 07/21/2020 10:24   CT ABDOMEN PELVIS W CONTRAST  Result Date: 07/21/2020 CLINICAL DATA:  Bowel obstruction suspected. Concern for ileus after recent surgery EXAM: CT ABDOMEN AND PELVIS WITH CONTRAST TECHNIQUE: Multidetector CT imaging of the abdomen and pelvis was performed using the standard protocol  following bolus administration of intravenous contrast. CONTRAST:  14mL OMNIPAQUE IOHEXOL 350 MG/ML SOLN COMPARISON:  10/23/2017 FINDINGS: Lower chest:  Reported separately Hepatobiliary: Heterogeneous density of the liver. There is intrahepatic venous reflux and passive congestion is suspected. Small cystic density in the inferior right lobe. No evidence of biliary obstruction or stone. Pancreas: Unremarkable. Spleen: Unremarkable. Adrenals/Urinary Tract: Negative adrenals. Severe native renal atrophy. 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 Small right renal cystic density. Unremarkable bladder. Stomach/Bowel: No visible bowel obstruction or inflammation. No ileus. No appendicitis. Vascular/Lymphatic: No acute vascular abnormality. Postoperative distortion of the right external iliac artery related transplant. No mass or adenopathy. Reproductive:No pathologic findings. Other: Large volume ascites without visible loculation or peritoneal nodularity. No Tenckhoff catheter in place. Musculoskeletal: Renal osteodystrophy with diffuse sclerosis of bones and bilateral sacroiliac subchondral erosion. IMPRESSION: 1. Large volume ascites, new from 2019. 2. Heterogeneous liver density, suspect passive congestion. 3. No bowel obstruction or ileus. Electronically Signed   By: Monte Fantasia M.D.   On: 07/21/2020 10:27    Procedures .Critical Care Performed by: Breck Coons, MD Authorized by: Breck Coons, MD   Critical care provider statement:    Critical care time (minutes):  45   Critical care was necessary to treat or prevent imminent or life-threatening deterioration of the following conditions:  Endocrine crisis and renal failure   Critical care was time spent personally by me on the following activities:  Discussions with consultants, evaluation of patient's response to treatment, examination of patient, ordering and performing treatments and  interventions, ordering and review of laboratory studies, ordering and  review of radiographic studies, pulse oximetry, re-evaluation of patient's condition, obtaining history from patient or surrogate, review of old charts and blood draw for specimens   (including critical care time)  Medications Ordered in ED Medications  calcitRIOL (CALCIJEX) 1 MCG/ML injection 1 mcg (1 mcg Intravenous Given 07/21/20 1017)  calcium carbonate (TUMS - dosed in mg elemental calcium) chewable tablet 400 mg of elemental calcium (has no administration in time range)  Chlorhexidine Gluconate Cloth 2 % PADS 6 each (has no administration in time range)  morphine 4 MG/ML injection 4 mg (4 mg Intravenous Given 07/21/20 0922)  calcium gluconate 1 g/ 50 mL sodium chloride IVPB (0 g Intravenous Stopped 07/21/20 0933)  calcium gluconate 1 g/ 50 mL sodium chloride IVPB (0 g Intravenous Stopped 07/21/20 0937)  iohexol (OMNIPAQUE) 350 MG/ML injection 100 mL (70 mLs Intravenous Contrast Given 07/21/20 1014)    ED Course  I have reviewed the triage vital signs and the nursing notes.  Pertinent labs & imaging results that were available during my care of the patient were reviewed by me and considered in my medical decision making (see chart for details).    MDM Rules/Calculators/A&P                          Concern for postop ileus versus postop complication causing shortness of breath, also patient has significant history of hypocalcemia on exam has a positive chopstick sign, significant for facial nerve spasm with palpation next the parotid.  With this calcium gluconate was given via rapid push, I consulted nephrology and they agreed to allow for contrasted scans for rule out PE versus obstruction versus ileus.  Pharmacy is also okay with giving calcium gluconate as rapidly as needed to affect patient's symptom control.  EKG reviewed by myself shows sinus rhythm with prolonged QT but no acute ischemic change interval  abnormality otherwise or arrhythmia.  Patient is significant symptomatic improvement with calcium given.  Family is at bedside and understand the plan and agree.  Nephrology feels strongly that this patient will likely need admission for further correction of metabolic derangements dialysis and medical optimization status post recent surgery.  The patient will require admission for further management of this.  CT imaging shows no acute surgical complication, does show no ascites, further evaluation as an inpatient will be necessary.  The hospitalist team agrees to take over her care at this time.  CRITICAL CARE Performed by: Breck Coons   Total critical care time: 45 minutes  Critical care time was exclusive of separately billable procedures and treating other patients.  Critical care was necessary to treat or prevent imminent or life-threatening deterioration.  Critical care was time spent personally by me on the following activities: development of treatment plan with patient and/or surrogate as well as nursing, discussions with consultants, evaluation of patient's response to treatment, examination of patient, obtaining history from patient or surrogate, ordering and performing treatments and interventions, ordering and review of laboratory studies, ordering and review of radiographic studies, pulse oximetry and re-evaluation of patient's condition.  Final Clinical Impression(s) / ED Diagnoses Final diagnoses:  None    Rx / DC Orders ED Discharge Orders    None       Breck Coons, MD 07/21/20 1127

## 2020-07-21 NOTE — Progress Notes (Signed)
[facility] Progress Note    Subjective:   Pt discharged earlier in the week s/p parathyroidectomy.  Returned with chest pain and abd bloating today.  Admitted for hypocalcemia and new ascites.  Feeling better now and eating lunch now.  Plans for dialysis this afternoon.   Objective Vitals:   07/21/20 1232 07/21/20 1300 07/21/20 1311 07/21/20 1354  BP: (!) 144/105 (!) 156/114 (!) 156/114 (!) 136/97  Pulse: 78 77 75 76  Resp: (!) 25 18 (!) 25 20  Temp:    98.8 F (37.1 C)  TempSrc:    Oral  SpO2: 100% 100% 100% 100%  Weight:    55.7 kg  Height:    5\' 4"  (1.626 m)   Physical Exam General: appears comfortable, eating tacos Heart: RRR Lungs: clear Abdomen: soft, mildly distended Extremities: no edema Dialysis Access:  LUE AVF +t/b  Additional Objective Labs: Basic Metabolic Panel: Recent Labs  Lab 07/18/20 0445 07/18/20 0445 07/21/20 0846 07/21/20 0930 07/21/20 1025  NA 136   < > 135 133* 133*  K 6.5*   < > 6.2* 6.1* 6.0*  CL 96*   < > 94* 94* 92*  CO2 23  --  23  --  24  GLUCOSE 97   < > 82 78 77  BUN 63*   < > 51* 56* 53*  CREATININE 9.55*   < > 7.94* 8.20* 7.86*  CALCIUM 6.9*  --  5.2*  --  5.6*  PHOS 8.5*  --   --   --  5.9*   < > = values in this interval not displayed.   Liver Function Tests: Recent Labs  Lab 07/18/20 0445 07/21/20 0846 07/21/20 1025  AST  --  31  --   ALT  --  11  --   ALKPHOS  --  177*  --   BILITOT  --  1.2  --   PROT  --  6.8  --   ALBUMIN 3.2* 3.4* 3.2*   Recent Labs  Lab 07/21/20 0846  LIPASE 23   CBC: Recent Labs  Lab 07/18/20 0833 07/18/20 0833 07/21/20 0846 07/21/20 0930 07/21/20 1359  WBC 6.3  --  10.3  --  9.9  NEUTROABS  --   --   --   --  6.7  HGB 9.1*   < > 9.7* 10.9* 10.5*  HCT 29.8*   < > 31.1* 32.0* 33.2*  MCV 100.7*  --  100.3*  --  98.5  PLT 105*  --  158  --  175   < > = values in this interval not displayed.   Blood Culture    Component Value Date/Time   SDES TISSUE  PERICARDIAL 10/08/2019 0837   SPECREQUEST NONE 10/08/2019 0837   CULT  10/08/2019 0837    No growth aerobically or anaerobically. Performed at [facility], [place] [address], [place], [place] [address]    REPTSTATUS 10/13/2019 FINAL 10/08/2019 0837    Cardiac Enzymes: No results for input(s): CKTOTAL, CKMB, CKMBINDEX, TROPONINI in the last 168 hours. CBG: Recent Labs  Lab 07/21/20 0858  GLUCAP 78   Iron Studies: No results for input(s): IRON, TIBC, TRANSFERRIN, FERRITIN in the last 72 hours. @lablastinr3 @ Studies/Results: CT Angio Chest PE W and/or Wo Contrast  Result Date: 07/21/2020 CLINICAL DATA:  Upper chest heaviness since last night EXAM: CT ANGIOGRAPHY CHEST WITH CONTRAST TECHNIQUE: Multidetector CT imaging of the chest was performed using the standard protocol during bolus administration of intravenous  contrast. Multiplanar CT image reconstructions and MIPs were obtained to evaluate the vascular anatomy. CONTRAST:  70 cc Omnipaque 350 intravenous COMPARISON:  None. FINDINGS: Cardiovascular: Cardiomegaly without pericardial effusion. No pulmonary artery filling defect. Limited opacification of the left heart and aorta. Mediastinum/Nodes: No adenopathy, gas, or collection. Postoperative swelling and gas in the lower neck correlating with recent parathyroidectomy history. No visible tracheal narrowing Lungs/Pleura: Hazy density of the bilateral lungs with immediate subpleural sparing. This appearance improves on the subsequent abdomen and pelvis CT and is likely from expiratory phase. No septal lines, effusion, or pneumothorax. Upper Abdomen: Reported separately Musculoskeletal: Generalized bony sclerosis with subchondral erosions at least at the medial clavicles. No acute finding Review of the MIP images confirms the above findings. IMPRESSION: 1. Negative for pulmonary embolism. 2. Recent parathyroidectomy with partially covered site. No visible airway narrowing. 3. Cardiomegaly. 4.  Renal osteodystrophy. Electronically Signed   By: Monte Fantasia M.D.   On: 07/21/2020 10:24   CT ABDOMEN PELVIS W CONTRAST  Result Date: 07/21/2020 CLINICAL DATA:  Bowel obstruction suspected. Concern for ileus after recent surgery EXAM: CT ABDOMEN AND PELVIS WITH CONTRAST TECHNIQUE: Multidetector CT imaging of the abdomen and pelvis was performed using the standard protocol following bolus administration of intravenous contrast. CONTRAST:  97mL OMNIPAQUE IOHEXOL 350 MG/ML SOLN COMPARISON:  10/23/2017 FINDINGS: Lower chest:  Reported separately Hepatobiliary: Heterogeneous density of the liver. There is intrahepatic venous reflux and passive congestion is suspected. Small cystic density in the inferior right lobe. No evidence of biliary obstruction or stone. Pancreas: Unremarkable. Spleen: Unremarkable. Adrenals/Urinary Tract: Negative adrenals. Severe native renal atrophy. 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 Small right renal cystic density. Unremarkable bladder. Stomach/Bowel: No visible bowel obstruction or inflammation. No ileus. No appendicitis. Vascular/Lymphatic: No acute vascular abnormality. Postoperative distortion of the right external iliac artery related transplant. No mass or adenopathy. Reproductive:No pathologic findings. Other: Large volume ascites without visible loculation or peritoneal nodularity. No Tenckhoff catheter in place. Musculoskeletal: Renal osteodystrophy with diffuse sclerosis of bones and bilateral sacroiliac subchondral erosion. IMPRESSION: 1. Large volume ascites, new from 2019. 2. Heterogeneous liver density, suspect passive congestion. 3. No bowel obstruction or ileus. Electronically Signed   By: Monte Fantasia M.D.   On: 07/21/2020 10:27   Medications: . sodium chloride    . sodium chloride     . calcitRIOL  1 mcg Intravenous BID  . calcium carbonate  2 tablet Oral TID  . Chlorhexidine  Gluconate Cloth  6 each Topical Q0600  . heparin  5,000 Units Subcutaneous Q8H    Dialysis Orders:  [facility] M[facility] 3h 45 min 350/600 EDW 53.5 kg 2K/2Ca  AVF No heparin  Mircera 75 q 2 weeks (to start 11/8) OP Labs 11/3: Ca 10.1 Alb 4.2 Phos 9.6 PTH 2213   Assessment/Plan: 1. 2HPTH s/p total PTHectomy with autotransplant. 4 glands removed per Dr. Harlow Asa 11/8.  Now complicated by hypocalcemia. S/p 2g IV calcium in ED.  Will tx with calcitriol 1 TID + 1g TID po calcium for now.  Dialyze with  3.5Ca dialysate.  2. ESRD -  HD MWF. HD today 3. Hyperkalemia - K 6.1, 2K dialysate today. 4. Hypertension/volume  - BP/volume ok. Continue home meds. UF to EDW as tolerated  5. Anemia  - Hgb 10.5, had ESA Monday  6. Metabolic bone disease -  S/p PTHectomy with calcium plan per above. Continue Auryixa binder .    7. Nutrition - Renal diet with  fluid restriction  Jannifer Hick MD 07/21/2020, 3:52 PM  [facility] Pager: [phone]

## 2020-07-21 NOTE — ED Notes (Signed)
  Pt transported to ct 

## 2020-07-21 NOTE — ED Triage Notes (Signed)
Pt reports she had her parathyroid removed on Monday, states she felt fine last night and woke up feeling like something was sitting on her chest. Pt also does dialysis, last session was wed. Patient pale and lethargic in triage.

## 2020-07-22 DIAGNOSIS — Z79899 Other long term (current) drug therapy: Secondary | ICD-10-CM | POA: Diagnosis not present

## 2020-07-22 DIAGNOSIS — Z20822 Contact with and (suspected) exposure to covid-19: Secondary | ICD-10-CM | POA: Diagnosis present

## 2020-07-22 DIAGNOSIS — Z888 Allergy status to other drugs, medicaments and biological substances status: Secondary | ICD-10-CM | POA: Diagnosis not present

## 2020-07-22 DIAGNOSIS — E892 Postprocedural hypoparathyroidism: Secondary | ICD-10-CM | POA: Diagnosis present

## 2020-07-22 DIAGNOSIS — Y830 Surgical operation with transplant of whole organ as the cause of abnormal reaction of the patient, or of later complication, without mention of misadventure at the time of the procedure: Secondary | ICD-10-CM | POA: Diagnosis present

## 2020-07-22 DIAGNOSIS — R188 Other ascites: Secondary | ICD-10-CM | POA: Diagnosis present

## 2020-07-22 DIAGNOSIS — Q8781 Alport syndrome: Secondary | ICD-10-CM

## 2020-07-22 DIAGNOSIS — Z992 Dependence on renal dialysis: Secondary | ICD-10-CM | POA: Diagnosis not present

## 2020-07-22 DIAGNOSIS — E875 Hyperkalemia: Secondary | ICD-10-CM | POA: Diagnosis present

## 2020-07-22 DIAGNOSIS — F32A Depression, unspecified: Secondary | ICD-10-CM | POA: Diagnosis present

## 2020-07-22 DIAGNOSIS — Z886 Allergy status to analgesic agent status: Secondary | ICD-10-CM | POA: Diagnosis not present

## 2020-07-22 DIAGNOSIS — T8612 Kidney transplant failure: Secondary | ICD-10-CM | POA: Diagnosis present

## 2020-07-22 DIAGNOSIS — N2581 Secondary hyperparathyroidism of renal origin: Secondary | ICD-10-CM | POA: Diagnosis present

## 2020-07-22 DIAGNOSIS — I132 Hypertensive heart and chronic kidney disease with heart failure and with stage 5 chronic kidney disease, or end stage renal disease: Secondary | ICD-10-CM | POA: Diagnosis present

## 2020-07-22 DIAGNOSIS — D631 Anemia in chronic kidney disease: Secondary | ICD-10-CM | POA: Diagnosis present

## 2020-07-22 DIAGNOSIS — R9431 Abnormal electrocardiogram [ECG] [EKG]: Secondary | ICD-10-CM | POA: Diagnosis present

## 2020-07-22 DIAGNOSIS — N186 End stage renal disease: Secondary | ICD-10-CM | POA: Diagnosis present

## 2020-07-22 DIAGNOSIS — Z8616 Personal history of COVID-19: Secondary | ICD-10-CM | POA: Diagnosis not present

## 2020-07-22 DIAGNOSIS — I5022 Chronic systolic (congestive) heart failure: Secondary | ICD-10-CM | POA: Diagnosis present

## 2020-07-22 DIAGNOSIS — R0789 Other chest pain: Secondary | ICD-10-CM | POA: Diagnosis present

## 2020-07-22 DIAGNOSIS — I081 Rheumatic disorders of both mitral and tricuspid valves: Secondary | ICD-10-CM | POA: Diagnosis present

## 2020-07-22 DIAGNOSIS — Z9489 Other transplanted organ and tissue status: Secondary | ICD-10-CM | POA: Diagnosis not present

## 2020-07-22 DIAGNOSIS — H9193 Unspecified hearing loss, bilateral: Secondary | ICD-10-CM | POA: Diagnosis present

## 2020-07-22 DIAGNOSIS — D440 Neoplasm of uncertain behavior of thyroid gland: Secondary | ICD-10-CM | POA: Diagnosis present

## 2020-07-22 LAB — COMPREHENSIVE METABOLIC PANEL
ALT: 8 U/L (ref 0–44)
ALT: 8 U/L (ref 0–44)
AST: 26 U/L (ref 15–41)
AST: 24 U/L (ref 15–41)
Albumin: 3.3 g/dL — ABNORMAL LOW (ref 3.5–5.0)
Albumin: 3 g/dL — ABNORMAL LOW (ref 3.5–5.0)
Alkaline Phosphatase: 198 U/L — ABNORMAL HIGH (ref 38–126)
Alkaline Phosphatase: 205 U/L — ABNORMAL HIGH (ref 38–126)
Anion gap: 17 — ABNORMAL HIGH (ref 5–15)
Anion gap: 12 (ref 5–15)
BUN: 59 mg/dL — ABNORMAL HIGH (ref 6–20)
BUN: 28 mg/dL — ABNORMAL HIGH (ref 6–20)
CO2: 23 mmol/L (ref 22–32)
CO2: 27 mmol/L (ref 22–32)
Calcium: 5.3 mg/dL — CL (ref 8.9–10.3)
Calcium: 7 mg/dL — ABNORMAL LOW (ref 8.9–10.3)
Chloride: 94 mmol/L — ABNORMAL LOW (ref 98–111)
Chloride: 99 mmol/L (ref 98–111)
Creatinine, Ser: 8.65 mg/dL — ABNORMAL HIGH (ref 0.44–1.00)
Creatinine, Ser: 5.43 mg/dL — ABNORMAL HIGH (ref 0.44–1.00)
GFR, Estimated: 6 mL/min — ABNORMAL LOW (ref >60)
GFR, Estimated: 11 mL/min — ABNORMAL LOW (ref >60)
Glucose, Bld: 97 mg/dL (ref 70–99)
Glucose, Bld: 116 mg/dL — ABNORMAL HIGH (ref 70–99)
Potassium: 5.7 mmol/L — ABNORMAL HIGH (ref 3.5–5.1)
Potassium: 4 mmol/L (ref 3.5–5.1)
Sodium: 134 mmol/L — ABNORMAL LOW (ref 135–145)
Sodium: 138 mmol/L (ref 135–145)
Total Bilirubin: 1 mg/dL (ref 0.3–1.2)
Total Bilirubin: 0.8 mg/dL (ref 0.3–1.2)
Total Protein: 6.8 g/dL (ref 6.5–8.1)
Total Protein: 6 g/dL — ABNORMAL LOW (ref 6.5–8.1)

## 2020-07-22 LAB — RENAL FUNCTION PANEL
Albumin: 3.3 g/dL — ABNORMAL LOW (ref 3.5–5.0)
Anion gap: 19 — ABNORMAL HIGH (ref 5–15)
BUN: 61 mg/dL — ABNORMAL HIGH (ref 6–20)
CO2: 23 mmol/L (ref 22–32)
Calcium: 5.1 mg/dL — CL (ref 8.9–10.3)
Chloride: 90 mmol/L — ABNORMAL LOW (ref 98–111)
Creatinine, Ser: 8.78 mg/dL — ABNORMAL HIGH (ref 0.44–1.00)
GFR, Estimated: 6 mL/min — ABNORMAL LOW (ref >60)
Glucose, Bld: 89 mg/dL (ref 70–99)
Phosphorus: 6.2 mg/dL — ABNORMAL HIGH (ref 2.5–4.6)
Potassium: 5.7 mmol/L — ABNORMAL HIGH (ref 3.5–5.1)
Sodium: 132 mmol/L — ABNORMAL LOW (ref 135–145)

## 2020-07-22 LAB — PHOSPHORUS: Phosphorus: 6.3 mg/dL — ABNORMAL HIGH (ref 2.5–4.6)

## 2020-07-22 MED ORDER — CALCIUM GLUCONATE-NACL 1-0.675 GM/50ML-% IV SOLN
1.0000 g | Freq: Once | INTRAVENOUS | Status: AC
  Administered 2020-07-22: 1000 mg via INTRAVENOUS
  Filled 2020-07-22: qty 50

## 2020-07-22 MED ORDER — CALCITRIOL 0.5 MCG PO CAPS
1.0000 ug | ORAL_CAPSULE | Freq: Three times a day (TID) | ORAL | Status: DC
  Administered 2020-07-22 – 2020-07-25 (×10): 1 ug via ORAL
  Filled 2020-07-22 (×9): qty 2

## 2020-07-22 NOTE — Progress Notes (Signed)
   07/22/20 0630  Hand-Off documentation  Handoff Given Given to shift RN/LPN  Report given to (Full Name) Sammie,  RN  Handoff Received Received from shift RN/LPN  Report received from (Full Name) Samarrah Tranchina, RN  Vitals  BP (!) 174/110  BP Location Right Leg  BP Method Automatic  Pulse Rate 89  ECG Heart Rate 88  Resp (!) 27  Oxygen Therapy  SpO2 100 %  Pain Assessment  Pain Scale 0-10  Pain Score 0  Post-Hemodialysis Assessment  Rinseback Volume (mL) 250 mL  KECN 175 V  Dialyzer Clearance Lightly streaked  Duration of HD Treatment -hour(s) 3.5 hour(s)  Hemodialysis Intake (mL) 500 mL  UF Total -Machine (mL) 500 mL  Net UF (mL) 0 mL  Tolerated HD Treatment Yes  Post-Hemodialysis Comments tx achieved as expected,BP elevated at this end of the tx.  AVG/AVF Arterial Site Held (minutes) 10 minutes  AVG/AVF Venous Site Held (minutes) 10 minutes  Education / Care Plan  Dialysis Education Provided Yes  Documented Education in Care Plan Yes  Outpatient Plan of Care Reviewed and on Chart Yes  Fistula / Graft Left Forearm Arteriovenous fistula  Placement Date/Time: 02/10/20 0824   Placed prior to admission: No  Orientation: Left  Access Location: Forearm  Access Type: Arteriovenous fistula  Site Condition No complications  Fistula / Graft Assessment Present;Thrill;Bruit  Status Deaccessed  Drainage Description None

## 2020-07-22 NOTE — Progress Notes (Signed)
Critical calcium 5.1, which was drawn early morning, notified [employer] PA.  Waiting for another lab to be drawn as patient had HD and also got calcium gluconate.  Will continue to monitor.

## 2020-07-22 NOTE — Care Management Obs Status (Signed)
[facility] NOTIFICATION   Patient Details  Name: Dominique Lawrence MRN: [id number] Date of Birth: [birth year]   Medicare Observation Status Notification Given:  Yes    Norina Buzzard, RN 07/22/2020, 4:26 PM

## 2020-07-22 NOTE — Progress Notes (Signed)
CRITICAL VALUE ALERT  Critical Value: 5.3  Date & Time Notied: 07/22/20 @ 0147  Provider Notified: Dr. Marlowe Sax notified  Orders Received/Actions taken: No new orders.

## 2020-07-22 NOTE — Plan of Care (Signed)
  Problem: Education: Goal: Knowledge of General Education information will improve Description Including pain rating scale, medication(s)/side effects and non-pharmacologic comfort measures Outcome: Progressing   

## 2020-07-22 NOTE — Progress Notes (Signed)
[facility] Progress Note   Subjective:   Patient seen and examined at bedside.  Feeling much better today.  Denies CP, abdominal pain, perioral tingling, n/v/d, weakness, dizziness and fatigue.  Had dialysis overnight/early this morning.  Tolerated well.   Objective Vitals:   07/22/20 0630 07/22/20 0642 07/22/20 0734 07/22/20 0957  BP: (!) 174/110 (!) 173/107 (!) 154/101 (!) 145/102  Pulse: 89 88 88 86  Resp: (!) 27 18  17   Temp:  99 F (37.2 C)  98.1 F (36.7 C)  TempSrc:    Oral  SpO2: 100% 100%  98%  Weight:      Height:       Physical Exam General:WDWN female in NAD Heart:RRR Lungs:CTAB, nml WOB Abdomen:soft, NTND Extremities:no LE edema Dialysis Access: LU AVF +t/b  Filed Weights   07/21/20 1021 07/21/20 1354 07/21/20 2246  Weight: 54.4 kg 55.7 kg 55.8 kg    Intake/Output Summary (Last 24 hours) at 07/22/2020 1027 Last data filed at 07/22/2020 0630 Gross per 24 hour  Intake 100 ml  Output 0 ml  Net 100 ml    Additional Objective Labs: Basic Metabolic Panel: Recent Labs  Lab 07/18/20 0445 07/18/20 0445 07/21/20 0846 07/21/20 0846 07/21/20 0930 07/21/20 1025 07/22/20 0033  NA 136   < > 135   < > 133* 133* 132*  134*  K 6.5*   < > 6.2*   < > 6.1* 6.0* 5.7*  5.7*  CL 96*   < > 94*   < > 94* 92* 90*  94*  CO2 23   < > 23  --   --  24 23  23   GLUCOSE 97   < > 82   < > 78 77 89  97  BUN 63*   < > 51*   < > 56* 53* 61*  59*  CREATININE 9.55*   < > 7.94*   < > 8.20* 7.86* 8.78*  8.65*  CALCIUM 6.9*   < > 5.2*  --   --  5.6* 5.1*  5.3*  PHOS 8.5*  --   --   --   --  5.9* 6.2*  6.3*   < > = values in this interval not displayed.   Liver Function Tests: Recent Labs  Lab 07/21/20 0846 07/21/20 1025 07/22/20 0033  AST 31  --  26  ALT 11  --  8  ALKPHOS 177*  --  198*  BILITOT 1.2  --  1.0  PROT 6.8  --  6.8  ALBUMIN 3.4* 3.2* 3.3*  3.3*   Recent Labs  Lab 07/21/20 0846  LIPASE 23   CBC: Recent Labs  Lab  07/18/20 0833 07/18/20 0833 07/21/20 0846 07/21/20 0930 07/21/20 1359  WBC 6.3  --  10.3  --  9.9  NEUTROABS  --   --   --   --  6.7  HGB 9.1*   < > 9.7* 10.9* 10.5*  HCT 29.8*   < > 31.1* 32.0* 33.2*  MCV 100.7*  --  100.3*  --  98.5  PLT 105*  --  158  --  175   < > = values in this interval not displayed.   Blood Culture    Component Value Date/Time   SDES TISSUE PERICARDIAL 10/08/2019 0837   SPECREQUEST NONE 10/08/2019 0837   CULT  10/08/2019 0837    No growth aerobically or anaerobically. Performed at [facility], [place] [address], [place], [place] [address]  REPTSTATUS 10/13/2019 FINAL 10/08/2019 0837   CBG: Recent Labs  Lab 07/21/20 0858  GLUCAP 78   Studies/Results: CT Angio Chest PE W and/or Wo Contrast  Result Date: 07/21/2020 CLINICAL DATA:  Upper chest heaviness since last night EXAM: CT ANGIOGRAPHY CHEST WITH CONTRAST TECHNIQUE: Multidetector CT imaging of the chest was performed using the standard protocol during bolus administration of intravenous contrast. Multiplanar CT image reconstructions and MIPs were obtained to evaluate the vascular anatomy. CONTRAST:  70 cc Omnipaque 350 intravenous COMPARISON:  None. FINDINGS: Cardiovascular: Cardiomegaly without pericardial effusion. No pulmonary artery filling defect. Limited opacification of the left heart and aorta. Mediastinum/Nodes: No adenopathy, gas, or collection. Postoperative swelling and gas in the lower neck correlating with recent parathyroidectomy history. No visible tracheal narrowing Lungs/Pleura: Hazy density of the bilateral lungs with immediate subpleural sparing. This appearance improves on the subsequent abdomen and pelvis CT and is likely from expiratory phase. No septal lines, effusion, or pneumothorax. Upper Abdomen: Reported separately Musculoskeletal: Generalized bony sclerosis with subchondral erosions at least at the medial clavicles. No acute finding Review of the MIP images confirms the  above findings. IMPRESSION: 1. Negative for pulmonary embolism. 2. Recent parathyroidectomy with partially covered site. No visible airway narrowing. 3. Cardiomegaly. 4. Renal osteodystrophy. Electronically Signed   By: Monte Fantasia M.D.   On: 07/21/2020 10:24   CT ABDOMEN PELVIS W CONTRAST  Result Date: 07/21/2020 CLINICAL DATA:  Bowel obstruction suspected. Concern for ileus after recent surgery EXAM: CT ABDOMEN AND PELVIS WITH CONTRAST TECHNIQUE: Multidetector CT imaging of the abdomen and pelvis was performed using the standard protocol following bolus administration of intravenous contrast. CONTRAST:  6mL OMNIPAQUE IOHEXOL 350 MG/ML SOLN COMPARISON:  10/23/2017 FINDINGS: Lower chest:  Reported separately Hepatobiliary: Heterogeneous density of the liver. There is intrahepatic venous reflux and passive congestion is suspected. Small cystic density in the inferior right lobe. No evidence of biliary obstruction or stone. Pancreas: Unremarkable. Spleen: Unremarkable. Adrenals/Urinary Tract: Negative adrenals. Severe native renal atrophy. 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 Small right renal cystic density. Unremarkable bladder. Stomach/Bowel: No visible bowel obstruction or inflammation. No ileus. No appendicitis. Vascular/Lymphatic: No acute vascular abnormality. Postoperative distortion of the right external iliac artery related transplant. No mass or adenopathy. Reproductive:No pathologic findings. Other: Large volume ascites without visible loculation or peritoneal nodularity. No Tenckhoff catheter in place. Musculoskeletal: Renal osteodystrophy with diffuse sclerosis of bones and bilateral sacroiliac subchondral erosion. IMPRESSION: 1. Large volume ascites, new from 2019. 2. Heterogeneous liver density, suspect passive congestion. 3. No bowel obstruction or ileus. Electronically Signed   By: Monte Fantasia M.D.   On: 07/21/2020  10:27    Medications: . calcium gluconate     . amLODipine  10 mg Oral Daily  . calcitRIOL  1 mcg Oral TID  . calcium carbonate  2 tablet Oral TID  . carvedilol  25 mg Oral BID WC  . Chlorhexidine Gluconate Cloth  6 each Topical Q0600  . ferric citrate  420 mg Oral TID WC  . heparin  5,000 Units Subcutaneous Q8H  . hydrALAZINE  100 mg Oral TID  . isosorbide mononitrate  30 mg Oral Daily    Dialysis Orders: [facility] M[facility] 3h 45 min 350/600 EDW 53.5 kg 2K/2Ca  AVF No heparin  Mircera 75 q 2 weeks (to start 11/8) OP Labs 11/3: Ca 10.1 Alb 4.2 Phos 9.6 PTH 2213  Assessment/Plan: 1. 2HPTH s/p total PTHectomy with autotransplant. 4 glands removed per  Dr. Harlow Asa 11/8.  Now complicated by hypocalcemia. S/p 2g IV calcium in ED.  Will tx with calcitriol 59mcg PO TID + 1g TID po calcium for now.  Dialyze with  3.5Ca dialysate. Repeat labs post dialysis ordered.  2. ESRD -HD MWF. HD yesterday per regular schedule.  Next HD on 11/15. 3. Hyperkalemia - K 5.7 this AM pre HD.  Labs today ordered.  4. Hypertension/volume - BP/volume ok. Continue home meds. UF to EDW as tolerated 5. Anemia - Hgb 10.5, had ESA Monday 6. Metabolic bone disease -S/p PTHectomy with calcium plan per above.Continue Auryixa binder.   7. Nutrition -Renal diet with fluid restriction  8. Dipso - need to be asymptomatic, tolerating PO calcium/calcitriol with improvement in Ca prior to d/c.  Jen Mow, PA-C [facility] 07/22/2020,10:27 AM  LOS: 0 days

## 2020-07-22 NOTE — Plan of Care (Signed)
  Problem: Activity: Goal: Risk for activity intolerance will decrease Outcome: Progressing   

## 2020-07-22 NOTE — Progress Notes (Addendum)
PROGRESS NOTE    Dominique Lawrence  LKG:[id number] DOB: [birth year] DOA: 07/21/2020 PCP: Carmie End, MD  Brief Narrative: 18/F with past medical history of Alport syndrome, ESRD on hemodialysis Monday Wednesday Friday,, chronic systolic CHF, hypertension, dyslipidemia, depression, secondary hyperparathyroidism underwent total parathyroidectomy with autotransplantation of parathyroid tissue to left forearm on 11/8.  -She presented to the ED with chest arm and leg pain with tingling -On further evaluation she she was noted to have severe hypocalcemia with total calcium of 5.2 and ionized calcium of 0.5, potassium was 6.2, CT abdomen pelvis showed large ascites   Assessment & Plan:   Severe symptomatic hypocalcemia Recent total parathyroidectomy for secondary hyperparathyroidism, autotransplant -Received 2 g of calcium gluconate in the emergency room -Now on calcitriol 1 mcg 3 times daily calcium carbonate 2 tabs 3 times daily -Repeat calcium is 5.3 today, corrected calcium is a little higher -Repeat calcium gluconate infusing -Per renal  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 MWF, failed renal transplant -Dialyzed last night -Lower dry weight as tolerated -Entresto discontinued due to hyperkalemia, ESRD  Ascites, 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 -CT notes heterogenous liver density with passive congestion,  -asymptomatic from ascites standpoint -Manage volume with dialysis, lower dry weight as tolerated  Chronic systolic CHF 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 -Continue carvedilol, Entresto discontinued due to hyperkalemia  Hypertension -Continue carvedilol, hydralazine, amlodipine  Anemia of chronic disease -Hemoglobin stable, continue EPO  DVT prophylaxis: Heparin subcutaneous Code Status: Full code Family Communication: No family at bedside Disposition Plan:  Status is: Observation  The patient will require care spanning > 2 midnights and should be moved  to inpatient because: Persistent severe electrolyte disturbances  Dispo: The patient is from: Home              Anticipated d/c is to: Home              Anticipated d/c date is: possibly 2days              Patient currently is not medically stable to d/c.  Consultants:   Nephrology   Procedures:   Antimicrobials:    Subjective: Feels better today, tingling and discomfort in her chest arms and legs are improved  Objective: Vitals:   07/22/20 0630 07/22/20 0642 07/22/20 0734 07/22/20 0957  BP: (!) 174/110 (!) 173/107 (!) 154/101 (!) 145/102  Pulse: 89 88 88 86  Resp: (!) 27 18  17   Temp:  99 F (37.2 C)  98.1 F (36.7 C)  TempSrc:    Oral  SpO2: 100% 100%  98%  Weight:      Height:        Intake/Output Summary (Last 24 hours) at 07/22/2020 1052 Last data filed at 07/22/2020 0900 Gross per 24 hour  Intake 460 ml  Output 0 ml  Net 460 ml   Filed Weights   07/21/20 1021 07/21/20 1354 07/21/20 2246  Weight: 54.4 kg 55.7 kg 55.8 kg    Examination:  General exam: Thinly built pleasant female sitting up in bed, AAOx3, no distress HEENT: Parathyroidectomy scar noted CVS: S1-S2, regular rate rhythm Lungs: Clear bilaterally Abdomen: Soft, nontender, bowel sounds present  extremities: No edema  Psychiatry: Mood & affect appropriate.     Data Reviewed:   CBC: Recent Labs  Lab 07/17/20 0821 07/18/20 0833 07/21/20 0846 07/21/20 0930 07/21/20 1359  WBC  --  6.3 10.3  --  9.9  NEUTROABS  --   --   --   --  6.7  HGB 9.9* 9.1* 9.7* 10.9* 10.5*  HCT 29.0* 29.8* 31.1* 32.0* 33.2*  MCV  --  100.7* 100.3*  --  98.5  PLT  --  105* 158  --  010   Basic Metabolic Panel: Recent Labs  Lab 07/18/20 0445 07/21/20 0846 07/21/20 0930 07/21/20 1025 07/22/20 0033  NA 136 135 133* 133* 132*  134*  K 6.5* 6.2* 6.1* 6.0* 5.7*  5.7*  CL 96* 94* 94* 92* 90*  94*  CO2 23 23  --  24 23  23   GLUCOSE 97 82 78 77 89  97  BUN 63* 51* 56* 53* 61*  59*  CREATININE  9.55* 7.94* 8.20* 7.86* 8.78*  8.65*  CALCIUM 6.9* 5.2*  --  5.6* 5.1*  5.3*  PHOS 8.5*  --   --  5.9* 6.2*  6.3*   GFR: Estimated Creatinine Clearance: 9 mL/min (A) (by C-G formula based on SCr of 8.65 mg/dL (H)). Liver Function Tests: Recent Labs  Lab 07/18/20 0445 07/21/20 0846 07/21/20 1025 07/22/20 0033  AST  --  31  --  26  ALT  --  11  --  8  ALKPHOS  --  177*  --  198*  BILITOT  --  1.2  --  1.0  PROT  --  6.8  --  6.8  ALBUMIN 3.2* 3.4* 3.2* 3.3*  3.3*   Recent Labs  Lab 07/21/20 0846  LIPASE 23   No results for input(s): AMMONIA in the last 168 hours. Coagulation Profile: No results for input(s): INR, PROTIME in the last 168 hours. Cardiac Enzymes: No results for input(s): CKTOTAL, CKMB, CKMBINDEX, TROPONINI in the last 168 hours. BNP (last 3 results) No results for input(s): PROBNP in the last 8760 hours. HbA1C: No results for input(s): HGBA1C in the last 72 hours. CBG: Recent Labs  Lab 07/21/20 0858  GLUCAP 78   Lipid Profile: No results for input(s): CHOL, HDL, LDLCALC, TRIG, CHOLHDL, LDLDIRECT in the last 72 hours. Thyroid Function Tests: No results for input(s): TSH, T4TOTAL, FREET4, T3FREE, THYROIDAB in the last 72 hours. Anemia Panel: No results for input(s): VITAMINB12, FOLATE, FERRITIN, TIBC, IRON, RETICCTPCT in the last 72 hours. Urine analysis:    Component Value Date/Time   COLORURINE STRAW (A) 02/26/2019 1404   APPEARANCEUR CLEAR 02/26/2019 1404   LABSPEC 1.008 02/26/2019 1404   PHURINE 9.0 (H) 02/26/2019 1404   GLUCOSEU 50 (A) 02/26/2019 1404   HGBUR SMALL (A) 02/26/2019 1404   BILIRUBINUR NEGATIVE 02/26/2019 1404   BILIRUBINUR neg 08/11/2017 1139   KETONESUR NEGATIVE 02/26/2019 1404   PROTEINUR 100 (A) 02/26/2019 1404   UROBILINOGEN negative (A) 08/11/2017 1139   UROBILINOGEN 0.2 02/26/2015 1600   NITRITE NEGATIVE 02/26/2019 1404   LEUKOCYTESUR SMALL (A) 02/26/2019 1404   Sepsis  Labs: @LABRCNTIP (procalcitonin:4,lacticidven:4)  ) Recent Results (from the past 240 hour(s))  SARS CORONAVIRUS 2 (TAT 6-24 HRS) Nasopharyngeal Nasopharyngeal Swab     Status: None   Collection Time: 07/13/20  2:28 PM   Specimen: Nasopharyngeal Swab  Result Value Ref Range Status   SARS Coronavirus 2 NEGATIVE NEGATIVE Final    Comment: (NOTE) SARS-CoV-2 target nucleic acids are NOT DETECTED.  The SARS-CoV-2 RNA is generally detectable in upper and lower respiratory specimens during the acute phase of infection. Negative results do not preclude SARS-CoV-2 infection, do not rule out co-infections with other pathogens, and should not be used as the sole basis for treatment or other patient management decisions. Negative results must be combined with  clinical observations, patient history, and epidemiological information. The expected result is Negative.  Fact Sheet for Patients: SugarRoll.be  Fact Sheet for Healthcare Providers: https://www.woods-mathews.com/  This test is not yet approved or cleared by the [place] FDA and  has been authorized for detection and/or diagnosis of SARS-CoV-2 by FDA under an Emergency Use Authorization (EUA). This EUA will remain  in effect (meaning this test can be used) for the duration of the COVID-19 declaration under Se ction 564(b)(1) of the Act, 21 U.S.C. section 360bbb-3(b)(1), unless the authorization is terminated or revoked sooner.  Performed at [facility], [place] [address], [place], [place] [address]   Respiratory Panel by RT PCR (Flu A&B, Covid) - Nasopharyngeal Swab     Status: None   Collection Time: 07/17/20  2:39 PM   Specimen: Nasopharyngeal Swab  Result Value Ref Range Status   SARS Coronavirus 2 by RT PCR NEGATIVE NEGATIVE Final    Comment: (NOTE) SARS-CoV-2 target nucleic acids are NOT DETECTED.  The SARS-CoV-2 RNA is generally detectable in upper respiratoy specimens  during the acute phase of infection. The lowest concentration of SARS-CoV-2 viral copies this assay can detect is 131 copies/mL. A negative result does not preclude SARS-Cov-2 infection and should not be used as the sole basis for treatment or other patient management decisions. A negative result may occur with  improper specimen collection/handling, submission of specimen other than nasopharyngeal swab, presence of viral mutation(s) within the areas targeted by this assay, and inadequate number of viral copies (<131 copies/mL). A negative result must be combined with clinical observations, patient history, and epidemiological information. The expected result is Negative.  Fact Sheet for Patients:  PinkCheek.be  Fact Sheet for Healthcare Providers:  GravelBags.it  This test is no t yet approved or cleared by the [place] FDA and  has been authorized for detection and/or diagnosis of SARS-CoV-2 by FDA under an Emergency Use Authorization (EUA). This EUA will remain  in effect (meaning this test can be used) for the duration of the COVID-19 declaration under Section 564(b)(1) of the Act, 21 U.S.C. section 360bbb-3(b)(1), unless the authorization is terminated or revoked sooner.     Influenza A by PCR NEGATIVE NEGATIVE Final   Influenza B by PCR NEGATIVE NEGATIVE Final    Comment: (NOTE) The Xpert Xpress SARS-CoV-2/FLU/RSV assay is intended as an aid in  the diagnosis of influenza from Nasopharyngeal swab specimens and  should not be used as a sole basis for treatment. Nasal washings and  aspirates are unacceptable for Xpert Xpress SARS-CoV-2/FLU/RSV  testing.  Fact Sheet for Patients: PinkCheek.be  Fact Sheet for Healthcare Providers: GravelBags.it  This test is not yet approved or cleared by the [place] FDA and  has been authorized for detection and/or  diagnosis of SARS-CoV-2 by  FDA under an Emergency Use Authorization (EUA). This EUA will remain  in effect (meaning this test can be used) for the duration of the  Covid-19 declaration under Section 564(b)(1) of the Act, 21  U.S.C. section 360bbb-3(b)(1), unless the authorization is  terminated or revoked. Performed at [facility], [place] [address] [place], [place] [address]   Respiratory Panel by RT PCR (Flu A&B, Covid) - Nasopharyngeal Swab     Status: None   Collection Time: 07/21/20  9:21 AM   Specimen: Nasopharyngeal Swab  Result Value Ref Range Status   SARS Coronavirus 2 by RT PCR NEGATIVE NEGATIVE Final    Comment: (NOTE) SARS-CoV-2 target nucleic acids are  NOT DETECTED.  The SARS-CoV-2 RNA is generally detectable in upper respiratoy specimens during the acute phase of infection. The lowest concentration of SARS-CoV-2 viral copies this assay can detect is 131 copies/mL. A negative result does not preclude SARS-Cov-2 infection and should not be used as the sole basis for treatment or other patient management decisions. A negative result may occur with  improper specimen collection/handling, submission of specimen other than nasopharyngeal swab, presence of viral mutation(s) within the areas targeted by this assay, and inadequate number of viral copies (<131 copies/mL). A negative result must be combined with clinical observations, patient history, and epidemiological information. The expected result is Negative.  Fact Sheet for Patients:  PinkCheek.be  Fact Sheet for Healthcare Providers:  GravelBags.it  This test is no t yet approved or cleared by the [place] FDA and  has been authorized for detection and/or diagnosis of SARS-CoV-2 by FDA under an Emergency Use Authorization (EUA). This EUA will remain  in effect (meaning this test can be used) for the duration of the COVID-19 declaration under  Section 564(b)(1) of the Act, 21 U.S.C. section 360bbb-3(b)(1), unless the authorization is terminated or revoked sooner.     Influenza A by PCR NEGATIVE NEGATIVE Final   Influenza B by PCR NEGATIVE NEGATIVE Final    Comment: (NOTE) The Xpert Xpress SARS-CoV-2/FLU/RSV assay is intended as an aid in  the diagnosis of influenza from Nasopharyngeal swab specimens and  should not be used as a sole basis for treatment. Nasal washings and  aspirates are unacceptable for Xpert Xpress SARS-CoV-2/FLU/RSV  testing.  Fact Sheet for Patients: PinkCheek.be  Fact Sheet for Healthcare Providers: GravelBags.it  This test is not yet approved or cleared by the [place] FDA and  has been authorized for detection and/or diagnosis of SARS-CoV-2 by  FDA under an Emergency Use Authorization (EUA). This EUA will remain  in effect (meaning this test can be used) for the duration of the  Covid-19 declaration under Section 564(b)(1) of the Act, 21  U.S.C. section 360bbb-3(b)(1), unless the authorization is  terminated or revoked. Performed at [facility], [place] [address] [place], [place] [address]   MRSA PCR Screening     Status: None   Collection Time: 07/21/20  4:40 PM   Specimen: Nasopharyngeal  Result Value Ref Range Status   MRSA by PCR NEGATIVE NEGATIVE Final    Comment:        The GeneXpert MRSA Assay (FDA approved for NASAL specimens only), is one component of a comprehensive MRSA colonization surveillance program. It is not intended to diagnose MRSA infection nor to guide or monitor treatment for MRSA infections. Performed at [facility], [place] [address] [place], [place] [address]          Radiology Studies: CT Angio Chest PE W and/or Wo Contrast  Result Date: 07/21/2020 CLINICAL DATA:  Upper chest heaviness since last night EXAM: CT ANGIOGRAPHY CHEST WITH CONTRAST TECHNIQUE: Multidetector CT imaging of  the chest was performed using the standard protocol during bolus administration of intravenous contrast. Multiplanar CT image reconstructions and MIPs were obtained to evaluate the vascular anatomy. CONTRAST:  70 cc Omnipaque 350 intravenous COMPARISON:  None. FINDINGS: Cardiovascular: Cardiomegaly without pericardial effusion. No pulmonary artery filling defect. Limited opacification of the left heart and aorta. Mediastinum/Nodes: No adenopathy, gas, or collection. Postoperative swelling and gas in the lower neck correlating with recent parathyroidectomy history. No visible tracheal narrowing Lungs/Pleura: Hazy density of the bilateral lungs with immediate  subpleural sparing. This appearance improves on the subsequent abdomen and pelvis CT and is likely from expiratory phase. No septal lines, effusion, or pneumothorax. Upper Abdomen: Reported separately Musculoskeletal: Generalized bony sclerosis with subchondral erosions at least at the medial clavicles. No acute finding Review of the MIP images confirms the above findings. IMPRESSION: 1. Negative for pulmonary embolism. 2. Recent parathyroidectomy with partially covered site. No visible airway narrowing. 3. Cardiomegaly. 4. Renal osteodystrophy. Electronically Signed   By: Monte Fantasia M.D.   On: 07/21/2020 10:24   CT ABDOMEN PELVIS W CONTRAST  Result Date: 07/21/2020 CLINICAL DATA:  Bowel obstruction suspected. Concern for ileus after recent surgery EXAM: CT ABDOMEN AND PELVIS WITH CONTRAST TECHNIQUE: Multidetector CT imaging of the abdomen and pelvis was performed using the standard protocol following bolus administration of intravenous contrast. CONTRAST:  81mL OMNIPAQUE IOHEXOL 350 MG/ML SOLN COMPARISON:  10/23/2017 FINDINGS: Lower chest:  Reported separately Hepatobiliary: Heterogeneous density of the liver. There is intrahepatic venous reflux and passive congestion is suspected. Small cystic density in the inferior right lobe. No evidence of biliary  obstruction or stone. Pancreas: Unremarkable. Spleen: Unremarkable. Adrenals/Urinary Tract: Negative adrenals. Severe native renal atrophy. 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 Small right renal cystic density. Unremarkable bladder. Stomach/Bowel: No visible bowel obstruction or inflammation. No ileus. No appendicitis. Vascular/Lymphatic: No acute vascular abnormality. Postoperative distortion of the right external iliac artery related transplant. No mass or adenopathy. Reproductive:No pathologic findings. Other: Large volume ascites without visible loculation or peritoneal nodularity. No Tenckhoff catheter in place. Musculoskeletal: Renal osteodystrophy with diffuse sclerosis of bones and bilateral sacroiliac subchondral erosion. IMPRESSION: 1. Large volume ascites, new from 2019. 2. Heterogeneous liver density, suspect passive congestion. 3. No bowel obstruction or ileus. Electronically Signed   By: Monte Fantasia M.D.   On: 07/21/2020 10:27        Scheduled Meds: . amLODipine  10 mg Oral Daily  . calcitRIOL  1 mcg Oral TID  . calcium carbonate  2 tablet Oral TID  . carvedilol  25 mg Oral BID WC  . Chlorhexidine Gluconate Cloth  6 each Topical Q0600  . ferric citrate  420 mg Oral TID WC  . heparin  5,000 Units Subcutaneous Q8H  . hydrALAZINE  100 mg Oral TID  . isosorbide mononitrate  30 mg Oral Daily   Continuous Infusions: . calcium gluconate 1,000 mg (07/22/20 1021)     LOS: 0 days    Time spent: 72min Domenic Polite, MD [facility]  07/22/2020, 10:52 AM

## 2020-07-23 LAB — COMPREHENSIVE METABOLIC PANEL
ALT: 7 U/L (ref 0–44)
AST: 21 U/L (ref 15–41)
Albumin: 2.9 g/dL — ABNORMAL LOW (ref 3.5–5.0)
Alkaline Phosphatase: 208 U/L — ABNORMAL HIGH (ref 38–126)
Anion gap: 12 (ref 5–15)
BUN: 33 mg/dL — ABNORMAL HIGH (ref 6–20)
CO2: 26 mmol/L (ref 22–32)
Calcium: 6.5 mg/dL — ABNORMAL LOW (ref 8.9–10.3)
Chloride: 99 mmol/L (ref 98–111)
Creatinine, Ser: 6.07 mg/dL — ABNORMAL HIGH (ref 0.44–1.00)
GFR, Estimated: 10 mL/min — ABNORMAL LOW (ref >60)
Glucose, Bld: 99 mg/dL (ref 70–99)
Potassium: 4 mmol/L (ref 3.5–5.1)
Sodium: 137 mmol/L (ref 135–145)
Total Bilirubin: 1.1 mg/dL (ref 0.3–1.2)
Total Protein: 6.1 g/dL — ABNORMAL LOW (ref 6.5–8.1)

## 2020-07-23 LAB — CBC
HCT: 26.4 % — ABNORMAL LOW (ref 36.0–46.0)
Hemoglobin: 8.4 g/dL — ABNORMAL LOW (ref 12.0–15.0)
MCH: 31.6 pg (ref 26.0–34.0)
MCHC: 31.8 g/dL (ref 30.0–36.0)
MCV: 99.2 fL (ref 80.0–100.0)
Platelets: 164 10*3/uL (ref 150–400)
RBC: 2.66 MIL/uL — ABNORMAL LOW (ref 3.87–5.11)
RDW: 15.3 % (ref 11.5–15.5)
WBC: 7.5 10*3/uL (ref 4.0–10.5)
nRBC: 0 % (ref 0.0–0.2)

## 2020-07-23 LAB — PROTIME-INR
INR: 1.1 (ref 0.8–1.2)
Prothrombin Time: 13.6 seconds (ref 11.4–15.2)

## 2020-07-23 MED ORDER — DARBEPOETIN ALFA 60 MCG/0.3ML IJ SOSY
60.0000 ug | PREFILLED_SYRINGE | INTRAMUSCULAR | Status: DC
  Administered 2020-07-24: 60 ug via INTRAVENOUS
  Filled 2020-07-23: qty 0.3

## 2020-07-23 MED ORDER — CALCIUM GLUCONATE-NACL 1-0.675 GM/50ML-% IV SOLN
1.0000 g | Freq: Once | INTRAVENOUS | Status: AC
  Administered 2020-07-23: 1000 mg via INTRAVENOUS
  Filled 2020-07-23: qty 50

## 2020-07-23 NOTE — Progress Notes (Signed)
PROGRESS NOTE    Dominique Lawrence  VQM:[id number] DOB: [birth year] DOA: 07/21/2020 PCP: Carmie End, MD  Brief Narrative: 18/F with past medical history of Alport syndrome, ESRD on hemodialysis Monday Wednesday Friday,, chronic systolic CHF, hypertension, dyslipidemia, depression, secondary hyperparathyroidism underwent total parathyroidectomy with autotransplantation of parathyroid tissue to left forearm on 11/8.  -She presented to the ED with chest arm and leg pain with tingling -On further evaluation she she was noted to have severe hypocalcemia with total calcium of 5.2 and ionized calcium of 0.5, potassium was 6.2, CT abdomen pelvis showed large ascites   Assessment & Plan:   Severe symptomatic hypocalcemia Recent total parathyroidectomy for secondary hyperparathyroidism, autotransplant -Given total of 3 g of calcium gluconate since admission -Also on calcitriol 1 mcg  TID and calcium carbonate -Repeat labs in a.m. 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 -Continue carvedilol, Entresto discontinued due to hyperkalemia  Hypertension -Continue carvedilol, hydralazine, amlodipine  Anemia of chronic disease -Hemoglobin stable, continue EPO  DVT prophylaxis: Heparin subcutaneous Code Status: Full code Family Communication: No family at bedside Disposition Plan:  Status is: Observation  The patient will require care spanning > 2 midnights and should be moved to inpatient because: Persistent  severe electrolyte disturbances  Dispo: The patient is from: Home              Anticipated d/c is to: Home              Anticipated d/c date is: Possibly tomorrow 11/15 if calcium improved and stable              Patient currently is not medically stable to d/c.  Consultants:   Nephrology   Procedures:   Antimicrobials:    Subjective: -Feels well, no complaints, anxious to go home  Objective: Vitals:   07/22/20 1736 07/22/20 2119 07/23/20 0558 07/23/20 0814  BP: 111/71 108/68 116/81 129/88  Pulse: 80 75 79 78  Resp: 17 16 16 16   Temp: 99.1 F (37.3 C) 98.4 F (36.9 C) 98.5 F (36.9 C) 98.6 F (37 C)  TempSrc: Oral Oral Oral Oral  SpO2: 98% 100% 97% 98%  Weight:      Height:        Intake/Output Summary (Last 24 hours) at 07/23/2020 1415 Last data filed at 07/23/2020 0900 Gross per 24 hour  Intake 660 ml  Output 0 ml  Net 660 ml   Filed Weights   07/21/20 1021 07/21/20 1354 07/21/20 2246  Weight: 54.4 kg 55.7 kg 55.8 kg    Examination:  General exam: Thinly built pleasant female sitting up in bed, AAOx3, no distress HEENT: Parathyroidectomy scar noted CVS: S1-S2, regular rate rhythm Lungs: Clear bilaterally Abdomen: Soft, nontender, bowel sounds present Extremities: No edema  Psychiatry: Mood & affect appropriate.     Data Reviewed:   CBC: Recent Labs  Lab 07/18/20 0833 07/21/20 0846 07/21/20 0930 07/21/20 1359 07/23/20 0217  WBC 6.3 10.3  --  9.9 7.5  NEUTROABS  --   --   --  6.7  --   HGB 9.1* 9.7* 10.9* 10.5* 8.4*  HCT 29.8* 31.1* 32.0* 33.2* 26.4*  MCV 100.7* 100.3*  --  98.5 99.2  PLT 105* 158  --  175 314   Basic Metabolic Panel: Recent Labs  Lab 07/18/20 0445 07/18/20 0445 07/21/20 0846 07/21/20 0846 07/21/20 0930 07/21/20 1025 07/22/20 0033 07/22/20 1433 07/23/20 0217  NA 136   < > 135   < > 133* 133* 132*  134* 138 137  K 6.5*   < > 6.2*   < > 6.1* 6.0* 5.7*  5.7* 4.0 4.0  CL 96*   < > 94*   < > 94* 92* 90*  94* 99  99  CO2 23   < > 23  --   --  24 23  23 27 26   GLUCOSE 97   < > 82   < > 78 77 89  97 116* 99  BUN 63*   < > 51*   < > 56* 53* 61*  59* 28* 33*  CREATININE 9.55*   < > 7.94*   < > 8.20* 7.86* 8.78*  8.65* 5.43* 6.07*  CALCIUM 6.9*   < > 5.2*  --   --  5.6* 5.1*  5.3* 7.0* 6.5*  PHOS 8.5*  --   --   --   --  5.9* 6.2*  6.3*  --   --    < > = values in this interval not displayed.   GFR: Estimated Creatinine Clearance: 12.8 mL/min (A) (by C-G formula based on SCr of 6.07 mg/dL (H)). Liver Function Tests: Recent Labs  Lab 07/21/20 0846 07/21/20 1025 07/22/20 0033 07/22/20 1433 07/23/20 0217  AST 31  --  26 24 21   ALT 11  --  8 8 7   ALKPHOS 177*  --  198* 205* 208*  BILITOT 1.2  --  1.0 0.8 1.1  PROT 6.8  --  6.8 6.0* 6.1*  ALBUMIN 3.4* 3.2* 3.3*  3.3* 3.0* 2.9*   Recent Labs  Lab 07/21/20 0846  LIPASE 23   No results for input(s): AMMONIA in the last 168 hours. Coagulation Profile: Recent Labs  Lab 07/23/20 0217  INR 1.1   Cardiac Enzymes: No results for input(s): CKTOTAL, CKMB, CKMBINDEX, TROPONINI in the last 168 hours. BNP (last 3 results) No results for input(s): PROBNP in the last 8760 hours. HbA1C: No results for input(s): HGBA1C in the last 72 hours. CBG: Recent Labs  Lab 07/21/20 0858  GLUCAP 78   Lipid Profile: No results for input(s): CHOL, HDL, LDLCALC, TRIG, CHOLHDL, LDLDIRECT in the last 72 hours. Thyroid Function Tests: No results for input(s): TSH, T4TOTAL, FREET4, T3FREE, THYROIDAB in the last 72 hours. Anemia Panel: No results for input(s): VITAMINB12, FOLATE, FERRITIN, TIBC, IRON, RETICCTPCT in the last 72 hours. Urine analysis:    Component Value Date/Time   COLORURINE STRAW (A) 02/26/2019 1404   APPEARANCEUR CLEAR 02/26/2019 1404   LABSPEC 1.008 02/26/2019 1404   PHURINE 9.0 (H) 02/26/2019 1404   GLUCOSEU 50 (A) 02/26/2019 1404   HGBUR SMALL (A) 02/26/2019 1404   BILIRUBINUR NEGATIVE 02/26/2019 1404   BILIRUBINUR neg 08/11/2017  1139   KETONESUR NEGATIVE 02/26/2019 1404   PROTEINUR 100 (A) 02/26/2019 1404   UROBILINOGEN negative (A) 08/11/2017 1139   UROBILINOGEN 0.2 02/26/2015 1600   NITRITE NEGATIVE 02/26/2019 1404   LEUKOCYTESUR SMALL (A) 02/26/2019 1404   Sepsis Labs: @LABRCNTIP (procalcitonin:4,lacticidven:4)  ) Recent Results (from the past 240 hour(s))  Respiratory Panel by RT PCR (Flu A&B, Covid) - Nasopharyngeal Swab  Status: None   Collection Time: 07/17/20  2:39 PM   Specimen: Nasopharyngeal Swab  Result Value Ref Range Status   SARS Coronavirus 2 by RT PCR NEGATIVE NEGATIVE Final    Comment: (NOTE) SARS-CoV-2 target nucleic acids are NOT DETECTED.  The SARS-CoV-2 RNA is generally detectable in upper respiratoy specimens during the acute phase of infection. The lowest concentration of SARS-CoV-2 viral copies this assay can detect is 131 copies/mL. A negative result does not preclude SARS-Cov-2 infection and should not be used as the sole basis for treatment or other patient management decisions. A negative result may occur with  improper specimen collection/handling, submission of specimen other than nasopharyngeal swab, presence of viral mutation(s) within the areas targeted by this assay, and inadequate number of viral copies (<131 copies/mL). A negative result must be combined with clinical observations, patient history, and epidemiological information. The expected result is Negative.  Fact Sheet for Patients:  PinkCheek.be  Fact Sheet for Healthcare Providers:  GravelBags.it  This test is no t yet approved or cleared by the [place] FDA and  has been authorized for detection and/or diagnosis of SARS-CoV-2 by FDA under an Emergency Use Authorization (EUA). This EUA will remain  in effect (meaning this test can be used) for the duration of the COVID-19 declaration under Section 564(b)(1) of the Act, 21 U.S.C. section  360bbb-3(b)(1), unless the authorization is terminated or revoked sooner.     Influenza A by PCR NEGATIVE NEGATIVE Final   Influenza B by PCR NEGATIVE NEGATIVE Final    Comment: (NOTE) The Xpert Xpress SARS-CoV-2/FLU/RSV assay is intended as an aid in  the diagnosis of influenza from Nasopharyngeal swab specimens and  should not be used as a sole basis for treatment. Nasal washings and  aspirates are unacceptable for Xpert Xpress SARS-CoV-2/FLU/RSV  testing.  Fact Sheet for Patients: PinkCheek.be  Fact Sheet for Healthcare Providers: GravelBags.it  This test is not yet approved or cleared by the [place] FDA and  has been authorized for detection and/or diagnosis of SARS-CoV-2 by  FDA under an Emergency Use Authorization (EUA). This EUA will remain  in effect (meaning this test can be used) for the duration of the  Covid-19 declaration under Section 564(b)(1) of the Act, 21  U.S.C. section 360bbb-3(b)(1), unless the authorization is  terminated or revoked. Performed at [facility], [place] [address] [place], [place] [address]   Respiratory Panel by RT PCR (Flu A&B, Covid) - Nasopharyngeal Swab     Status: None   Collection Time: 07/21/20  9:21 AM   Specimen: Nasopharyngeal Swab  Result Value Ref Range Status   SARS Coronavirus 2 by RT PCR NEGATIVE NEGATIVE Final    Comment: (NOTE) SARS-CoV-2 target nucleic acids are NOT DETECTED.  The SARS-CoV-2 RNA is generally detectable in upper respiratoy specimens during the acute phase of infection. The lowest concentration of SARS-CoV-2 viral copies this assay can detect is 131 copies/mL. A negative result does not preclude SARS-Cov-2 infection and should not be used as the sole basis for treatment or other patient management decisions. A negative result may occur with  improper specimen collection/handling, submission of specimen other than nasopharyngeal swab,  presence of viral mutation(s) within the areas targeted by this assay, and inadequate number of viral copies (<131 copies/mL). A negative result must be combined with clinical observations, patient history, and epidemiological information. The expected result is Negative.  Fact Sheet for Patients:  PinkCheek.be  Fact Sheet for Healthcare Providers:  GravelBags.it  This test is no t yet approved or cleared by the [place] and  has been authorized for detection and/or diagnosis of SARS-CoV-2 by FDA under an Emergency Use Authorization (EUA). This EUA will remain  in effect (meaning this test can be used) for the duration of the COVID-19 declaration under Section 564(b)(1) of the Act, 21 U.S.C. section 360bbb-3(b)(1), unless the authorization is terminated or revoked sooner.     Influenza A by PCR NEGATIVE NEGATIVE Final   Influenza B by PCR NEGATIVE NEGATIVE Final    Comment: (NOTE) The Xpert Xpress SARS-CoV-2/FLU/RSV assay is intended as an aid in  the diagnosis of influenza from Nasopharyngeal swab specimens and  should not be used as a sole basis for treatment. Nasal washings and  aspirates are unacceptable for Xpert Xpress SARS-CoV-2/FLU/RSV  testing.  Fact Sheet for Patients: PinkCheek.be  Fact Sheet for Healthcare Providers: GravelBags.it  This test is not yet approved or cleared by the [place] FDA and  has been authorized for detection and/or diagnosis of SARS-CoV-2 by  FDA under an Emergency Use Authorization (EUA). This EUA will remain  in effect (meaning this test can be used) for the duration of the  Covid-19 declaration under Section 564(b)(1) of the Act, 21  U.S.C. section 360bbb-3(b)(1), unless the authorization is  terminated or revoked. Performed at [facility], [place] [address], [place], [place] [address]   MRSA PCR  Screening     Status: None   Collection Time: 07/21/20  4:40 PM   Specimen: Nasopharyngeal  Result Value Ref Range Status   MRSA by PCR NEGATIVE NEGATIVE Final    Comment:        The GeneXpert MRSA Assay (FDA approved for NASAL specimens only), is one component of a comprehensive MRSA colonization surveillance program. It is not intended to diagnose MRSA infection nor to guide or monitor treatment for MRSA infections. Performed at [facility], [place] [address] [place], [place] [address]     Radiology Studies: No results found.  Scheduled Meds: . amLODipine  10 mg Oral Daily  . calcitRIOL  1 mcg Oral TID  . calcium carbonate  2 tablet Oral TID  . carvedilol  25 mg Oral BID WC  . Chlorhexidine Gluconate Cloth  6 each Topical Q0600  . [START ON 07/24/2020] darbepoetin (ARANESP) injection - DIALYSIS  60 mcg Intravenous Q Mon-HD  . ferric citrate  420 mg Oral TID WC  . heparin  5,000 Units Subcutaneous Q8H  . hydrALAZINE  100 mg Oral TID  . isosorbide mononitrate  30 mg Oral Daily   Continuous Infusions:    LOS: 1 day    Time spent: 27min Domenic Polite, MD [facility]  07/23/2020, 2:15 PM

## 2020-07-23 NOTE — Progress Notes (Signed)
Notified MD Broadus John that Ca= 6.5 this AM via secure chat.

## 2020-07-23 NOTE — Progress Notes (Signed)
[facility] Progress Note   Subjective:   Patient seen and examined at bedside.  Denies CP, SOB, n/v/d, edema, perioral numbness/tingling, tetany, numbness in hand or feet. Not happy that she needs to stay another night.  Consoled.  Discussed importance of making sure her Calcium is improved and asymptomatic before sending home.  She agreed.    Objective Vitals:   07/22/20 1736 07/22/20 2119 07/23/20 0558 07/23/20 0814  BP: 111/71 108/68 116/81 129/88  Pulse: 80 75 79 78  Resp: 17 16 16 16   Temp: 99.1 F (37.3 C) 98.4 F (36.9 C) 98.5 F (36.9 C) 98.6 F (37 C)  TempSrc: Oral Oral Oral Oral  SpO2: 98% 100% 97% 98%  Weight:      Height:       Physical Exam General:WDWN female in NAD Heart:RRR Lungs:CTAB Abdomen:soft, NTND Extremities: no edema Dialysis Access: LU AVF +b/t   Filed Weights   07/21/20 1021 07/21/20 1354 07/21/20 2246  Weight: 54.4 kg 55.7 kg 55.8 kg    Intake/Output Summary (Last 24 hours) at 07/23/2020 1234 Last data filed at 07/23/2020 0900 Gross per 24 hour  Intake 660 ml  Output 0 ml  Net 660 ml    Additional Objective Labs: Basic Metabolic Panel: Recent Labs  Lab 07/18/20 0445 07/21/20 0846 07/21/20 1025 07/21/20 1025 07/22/20 0033 07/22/20 1433 07/23/20 0217  NA 136   < > 133*   < > 132*  134* 138 137  K 6.5*   < > 6.0*   < > 5.7*  5.7* 4.0 4.0  CL 96*   < > 92*   < > 90*  94* 99 99  CO2 23   < > 24   < > 23  23 27 26   GLUCOSE 97   < > 77   < > 89  97 116* 99  BUN 63*   < > 53*   < > 61*  59* 28* 33*  CREATININE 9.55*   < > 7.86*   < > 8.78*  8.65* 5.43* 6.07*  CALCIUM 6.9*   < > 5.6*   < > 5.1*  5.3* 7.0* 6.5*  PHOS 8.5*  --  5.9*  --  6.2*  6.3*  --   --    < > = values in this interval not displayed.   Liver Function Tests: Recent Labs  Lab 07/22/20 0033 07/22/20 1433 07/23/20 0217  AST 26 24 21   ALT 8 8 7   ALKPHOS 198* 205* 208*  BILITOT 1.0 0.8 1.1  PROT 6.8 6.0* 6.1*  ALBUMIN 3.3*  3.3*  3.0* 2.9*   Recent Labs  Lab 07/21/20 0846  LIPASE 23   CBC: Recent Labs  Lab 07/18/20 0833 07/18/20 0833 07/21/20 0846 07/21/20 0846 07/21/20 0930 07/21/20 1359 07/23/20 0217  WBC 6.3   < > 10.3  --   --  9.9 7.5  NEUTROABS  --   --   --   --   --  6.7  --   HGB 9.1*   < > 9.7*   < > 10.9* 10.5* 8.4*  HCT 29.8*   < > 31.1*   < > 32.0* 33.2* 26.4*  MCV 100.7*  --  100.3*  --   --  98.5 99.2  PLT 105*   < > 158  --   --  175 164   < > = values in this interval not displayed.   Medications:  . amLODipine  10 mg Oral Daily  .  calcitRIOL  1 mcg Oral TID  . calcium carbonate  2 tablet Oral TID  . carvedilol  25 mg Oral BID WC  . Chlorhexidine Gluconate Cloth  6 each Topical Q0600  . [START ON 07/24/2020] darbepoetin (ARANESP) injection - DIALYSIS  60 mcg Intravenous Q Mon-HD  . ferric citrate  420 mg Oral TID WC  . heparin  5,000 Units Subcutaneous Q8H  . hydrALAZINE  100 mg Oral TID  . isosorbide mononitrate  30 mg Oral Daily    Dialysis Orders: [facility] MWF 3h 45 min 350/600 EDW 53.5 kg 2K/2Ca  AVF No heparin  Mircera 75 q 2 weeks (to start 11/8) OP Labs 11/3: Ca 10.1 Alb 4.2 Phos 9.6 PTH 2213  Assessment/Plan: 1. 2HPTH s/p total PTHectomy with autotransplant. 4 glands removed per Dr. Harlow Asa 11/8.Now complicated by hypocalcemia. S/p 2g IV calcium in ED, with additional 1g IV yesterday and today. Will tx with calcitriol 56mcg PO TID + 1g TID po calcium for now. Dialyze with 3.5Ca dialysate. Ca 7.0 yesterday, then 6.5 this AM.  Need to be in asymptomatic in mid 7s for d/c. 2. ESRD -HD MWF. HD tomorrow per regular schedule.  If d/c tomorrow she wants to go to OP dialysis.  Will write orders for HD 2nd shift if remains inpatient.  3. Hyperkalemia - resolved. K 4.0. 4. Hypertension/volume - BP/volume ok. Continue home meds. UF to EDW as tolerated 5. Anemia - Hgb10.5>8.5, had ESA Monday. Will write for aranesp 43mcg with HD tomorrow if remains admitted. 6. Metabolic  bone disease -S/p PTHectomywith calcium plan per above.Continue Auryixa binder. 7. Nutrition -Renal diet with fluid restriction  8. Dipso - need to be asymptomatic, tolerating PO calcium/calcitriol with improvement in Ca to mid 7s prior to d/c per Dr. Bufford Spikes, PA-C [place] 07/23/2020,12:34 PM  LOS: 1 day

## 2020-07-24 DIAGNOSIS — R188 Other ascites: Secondary | ICD-10-CM | POA: Diagnosis not present

## 2020-07-24 LAB — BASIC METABOLIC PANEL
Anion gap: 11 (ref 5–15)
BUN: 22 mg/dL — ABNORMAL HIGH (ref 6–20)
CO2: 26 mmol/L (ref 22–32)
Calcium: 8.1 mg/dL — ABNORMAL LOW (ref 8.9–10.3)
Chloride: 101 mmol/L (ref 98–111)
Creatinine, Ser: 4.92 mg/dL — ABNORMAL HIGH (ref 0.44–1.00)
GFR, Estimated: 12 mL/min — ABNORMAL LOW (ref >60)
Glucose, Bld: 107 mg/dL — ABNORMAL HIGH (ref 70–99)
Potassium: 3.9 mmol/L (ref 3.5–5.1)
Sodium: 138 mmol/L (ref 135–145)

## 2020-07-24 LAB — COMPREHENSIVE METABOLIC PANEL
ALT: 7 U/L (ref 0–44)
AST: 20 U/L (ref 15–41)
Albumin: 3.1 g/dL — ABNORMAL LOW (ref 3.5–5.0)
Alkaline Phosphatase: 265 U/L — ABNORMAL HIGH (ref 38–126)
Anion gap: 12 (ref 5–15)
BUN: 40 mg/dL — ABNORMAL HIGH (ref 6–20)
CO2: 26 mmol/L (ref 22–32)
Calcium: 6.4 mg/dL — CL (ref 8.9–10.3)
Chloride: 97 mmol/L — ABNORMAL LOW (ref 98–111)
Creatinine, Ser: 7.63 mg/dL — ABNORMAL HIGH (ref 0.44–1.00)
GFR, Estimated: 7 mL/min — ABNORMAL LOW (ref >60)
Glucose, Bld: 102 mg/dL — ABNORMAL HIGH (ref 70–99)
Potassium: 4 mmol/L (ref 3.5–5.1)
Sodium: 135 mmol/L (ref 135–145)
Total Bilirubin: 0.6 mg/dL (ref 0.3–1.2)
Total Protein: 6.3 g/dL — ABNORMAL LOW (ref 6.5–8.1)

## 2020-07-24 LAB — IRON AND TIBC
Iron: 55 ug/dL (ref 28–170)
Saturation Ratios: 22 % (ref 10.4–31.8)
TIBC: 246 ug/dL — ABNORMAL LOW (ref 250–450)
UIBC: 191 ug/dL

## 2020-07-24 LAB — FOLATE: Folate: 12.1 ng/mL (ref >5.9)

## 2020-07-24 LAB — CBC
HCT: 26.7 % — ABNORMAL LOW (ref 36.0–46.0)
Hemoglobin: 8.3 g/dL — ABNORMAL LOW (ref 12.0–15.0)
MCH: 31.2 pg (ref 26.0–34.0)
MCHC: 31.1 g/dL (ref 30.0–36.0)
MCV: 100.4 fL — ABNORMAL HIGH (ref 80.0–100.0)
Platelets: 175 10*3/uL (ref 150–400)
RBC: 2.66 MIL/uL — ABNORMAL LOW (ref 3.87–5.11)
RDW: 15.8 % — ABNORMAL HIGH (ref 11.5–15.5)
WBC: 7.3 10*3/uL (ref 4.0–10.5)
nRBC: 0 % (ref 0.0–0.2)

## 2020-07-24 LAB — VITAMIN B12: Vitamin B-12: 215 pg/mL (ref 180–914)

## 2020-07-24 LAB — RETICULOCYTES
Immature Retic Fract: 29.2 % — ABNORMAL HIGH (ref 2.3–15.9)
RBC.: 2.66 MIL/uL — ABNORMAL LOW (ref 3.87–5.11)
Retic Count, Absolute: 79.3 10*3/uL (ref 19.0–186.0)
Retic Ct Pct: 3 % (ref 0.4–3.1)

## 2020-07-24 LAB — FERRITIN: Ferritin: 406 ng/mL — ABNORMAL HIGH (ref 11–307)

## 2020-07-24 MED ORDER — POLYETHYLENE GLYCOL 3350 17 G PO PACK
17.0000 g | PACK | Freq: Every day | ORAL | Status: DC
  Administered 2020-07-24: 17 g via ORAL
  Filled 2020-07-24 (×2): qty 1

## 2020-07-24 MED ORDER — CALCIUM GLUCONATE-NACL 1-0.675 GM/50ML-% IV SOLN: 1.0000 g | Freq: Once | INTRAVENOUS | Status: DC

## 2020-07-24 MED ORDER — SENNOSIDES-DOCUSATE SODIUM 8.6-50 MG PO TABS
1.0000 | ORAL_TABLET | Freq: Two times a day (BID) | ORAL | Status: DC
  Administered 2020-07-24 – 2020-07-25 (×3): 1 via ORAL
  Filled 2020-07-24 (×3): qty 1

## 2020-07-24 MED ORDER — CALCITRIOL 0.5 MCG PO CAPS
ORAL_CAPSULE | ORAL | Status: AC
  Filled 2020-07-24: qty 2

## 2020-07-24 MED ORDER — CALCIUM CARBONATE ANTACID 500 MG PO CHEW
800.0000 mg | CHEWABLE_TABLET | Freq: Three times a day (TID) | ORAL | Status: DC
  Administered 2020-07-24 – 2020-07-25 (×3): 800 mg via ORAL
  Filled 2020-07-24 (×3): qty 4

## 2020-07-24 MED ORDER — DARBEPOETIN ALFA 60 MCG/0.3ML IJ SOSY
PREFILLED_SYRINGE | INTRAMUSCULAR | Status: AC
  Filled 2020-07-24: qty 0.3

## 2020-07-24 NOTE — Progress Notes (Signed)
Patient stated that she wants to go to dialysis after breakfast.   Dominique Lawrence.

## 2020-07-24 NOTE — Progress Notes (Addendum)
PROGRESS NOTE    Dominique Lawrence  EHO:[id number] DOB: Jan 08, 2001 DOA: 07/21/2020 PCP: Carmie End, MD  Brief Narrative: 18/F with past medical history of Alport syndrome, ESRD on hemodialysis Monday Wednesday Friday,, chronic systolic CHF, hypertension, dyslipidemia, depression, secondary hyperparathyroidism underwent total parathyroidectomy with autotransplantation of parathyroid tissue to left forearm on 11/8.  -She presented to the ED with chest arm and leg pain with tingling -On further evaluation she she was noted to have severe hypocalcemia with total calcium of 5.2 and ionized calcium of 0.5, potassium was 6.2, CT abdomen pelvis showed large ascites   Assessment & Plan:   Severe symptomatic hypocalcemia Recent total parathyroidectomy for secondary hyperparathyroidism, autotransplant -Received a total of 4 g of calcium gluconate since admission, last dose 11/14 a.m. -Also on calcitriol 1 mcg  TID and calcium carbonate 3 times daily -Per nephrology -Labs in a.m.  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 -Continue carvedilol, Entresto discontinued due to hyperkalemia  Hypertension -Continue carvedilol, hydralazine, amlodipine  Anemia of chronic disease -Hemoglobin stable, continue EPO  DVT prophylaxis: Heparin subcutaneous Code Status: Full code Family Communication: No family at bedside Disposition Plan:  Status is: Inpatient Remains inpatient due to persistent severe hypocalcemia  Dispo: The patient is from: Home              Anticipated d/c is to: Home               Anticipated d/c date is: Possibly tomorrow 11/16 if calcium improved and stable              Patient currently is not medically stable to d/c.  Consultants:   Nephrology   Procedures:   Antimicrobials:    Subjective: -Feels well overall, no complaints, frustrated and anxious to go home  Objective: Vitals:   07/24/20 1300 07/24/20 1315 07/24/20 1330 07/24/20 1400  BP: (!) 142/96  (!) 141/95 (!) 147/98  Pulse: 78  79 82  Resp: (!) 26 (!) 26 (!) 22 (!) 26  Temp:      TempSrc:      SpO2:      Weight:      Height:        Intake/Output Summary (Last 24 hours) at 07/24/2020 1425 Last data filed at 07/24/2020 0700 Gross per 24 hour  Intake 360 ml  Output 0 ml  Net 360 ml   Filed Weights   07/21/20 1354 07/21/20 2246 07/24/20 1125  Weight: 55.7 kg 55.8 kg 59 kg    Examination:  General exam: Thinly built pleasant female sitting up in bed, AAOx3, no distress HEENT: Parathyroidectomy scar noted CVS: S1-S2, regular rate rhythm Lungs: Clear bilaterally Abdomen: Soft, nontender, bowel sounds present Extremities: No edema  Psychiatry: Mood & affect appropriate.     Data Reviewed:   CBC: Recent Labs  Lab 07/18/20 0833 07/18/20 0833 07/21/20 0846 07/21/20 0930 07/21/20 1359 07/23/20 0217 07/24/20 0235  WBC 6.3  --  10.3  --  9.9 7.5 7.3  NEUTROABS  --   --   --   --  6.7  --   --   HGB 9.1*   < > 9.7* 10.9* 10.5* 8.4* 8.3*  HCT 29.8*   < > 31.1* 32.0* 33.2* 26.4* 26.7*  MCV 100.7*  --  100.3*  --  98.5 99.2 100.4*  PLT 105*  --  158  --  175 164 175   < > = values in this interval not displayed.   Basic Metabolic Panel: Recent Labs  Lab 07/18/20 0445 07/21/20 0846 07/21/20 1025 07/22/20 0033 07/22/20 1433 07/23/20 0217 07/24/20 0235  NA 136   < > 133* 132*  134* 138 137 135  K 6.5*   < > 6.0* 5.7*  5.7* 4.0 4.0 4.0  CL 96*   < > 92* 90*  94* 99 99 97*  CO2 23   < > 24 23  23 27 26 26   GLUCOSE 97   < > 77 89  97 116* 99 102*  BUN 63*   < >  53* 61*  59* 28* 33* 40*  CREATININE 9.55*   < > 7.86* 8.78*  8.65* 5.43* 6.07* 7.63*  CALCIUM 6.9*   < > 5.6* 5.1*  5.3* 7.0* 6.5* 6.4*  PHOS 8.5*  --  5.9* 6.2*  6.3*  --   --   --    < > = values in this interval not displayed.   GFR: Estimated Creatinine Clearance: 10.2 mL/min (A) (by C-G formula based on SCr of 7.63 mg/dL (H)). Liver Function Tests: Recent Labs  Lab 07/21/20 0846 07/21/20 0846 07/21/20 1025 07/22/20 0033 07/22/20 1433 07/23/20 0217 07/24/20 0235  AST 31  --   --  26 24 21 20   ALT 11  --   --  8 8 7 7   ALKPHOS 177*  --   --  198* 205* 208* 265*  BILITOT 1.2  --   --  1.0 0.8 1.1 0.6  PROT 6.8  --   --  6.8 6.0* 6.1* 6.3*  ALBUMIN 3.4*   < > 3.2* 3.3*  3.3* 3.0* 2.9* 3.1*   < > = values in this interval not displayed.   Recent Labs  Lab 07/21/20 0846  LIPASE 23   No results for input(s): AMMONIA in the last 168 hours. Coagulation Profile: Recent Labs  Lab 07/23/20 0217  INR 1.1   Cardiac Enzymes: No results for input(s): CKTOTAL, CKMB, CKMBINDEX, TROPONINI in the last 168 hours. BNP (last 3 results) No results for input(s): PROBNP in the last 8760 hours. HbA1C: No results for input(s): HGBA1C in the last 72 hours. CBG: Recent Labs  Lab 07/21/20 0858  GLUCAP 78   Lipid Profile: No results for input(s): CHOL, HDL, LDLCALC, TRIG, CHOLHDL, LDLDIRECT in the last 72 hours. Thyroid Function Tests: No results for input(s): TSH, T4TOTAL, FREET4, T3FREE, THYROIDAB in the last 72 hours. Anemia Panel: Recent Labs    07/24/20 0235  VITAMINB12 215  FOLATE 12.1  FERRITIN 406*  TIBC 246*  IRON 55  RETICCTPCT 3.0   Urine analysis:    Component Value Date/Time   COLORURINE STRAW (A) 02/26/2019 1404   APPEARANCEUR CLEAR 02/26/2019 1404   LABSPEC 1.008 02/26/2019 1404   PHURINE 9.0 (H) 02/26/2019 1404   GLUCOSEU 50 (A) 02/26/2019 1404   HGBUR SMALL (A) 02/26/2019 1404   BILIRUBINUR NEGATIVE 02/26/2019 1404   BILIRUBINUR neg 08/11/2017  1139   KETONESUR NEGATIVE 02/26/2019 1404   PROTEINUR 100 (A) 02/26/2019 1404   UROBILINOGEN negative (A) 08/11/2017 1139   UROBILINOGEN 0.2 02/26/2015 1600   NITRITE NEGATIVE 02/26/2019 1404   LEUKOCYTESUR SMALL (A) 02/26/2019 1404   Sepsis Labs: @LABRCNTIP (procalcitonin:4,lacticidven:4)  )  Recent Results (from the past 240 hour(s))  Respiratory Panel by RT PCR (Flu A&B, Covid) - Nasopharyngeal Swab     Status: None   Collection Time: 07/17/20  2:39 PM   Specimen: Nasopharyngeal Swab  Result Value Ref Range Status   SARS Coronavirus 2 by RT PCR NEGATIVE NEGATIVE Final    Comment: (NOTE) SARS-CoV-2 target nucleic acids are NOT DETECTED.  The SARS-CoV-2 RNA is generally detectable in upper respiratoy specimens during the acute phase of infection. The lowest concentration of SARS-CoV-2 viral copies this assay can detect is 131 copies/mL. A negative result does not preclude SARS-Cov-2 infection and should not be used as the sole basis for treatment or other patient management decisions. A negative result may occur with  improper specimen collection/handling, submission of specimen other than nasopharyngeal swab, presence of viral mutation(s) within the areas targeted by this assay, and inadequate number of viral copies (<131 copies/mL). A negative result must be combined with clinical observations, patient history, and epidemiological information. The expected result is Negative.  Fact Sheet for Patients:  PinkCheek.be  Fact Sheet for Healthcare Providers:  GravelBags.it  This test is no t yet approved or cleared by the [place] FDA and  has been authorized for detection and/or diagnosis of SARS-CoV-2 by FDA under an Emergency Use Authorization (EUA). This EUA will remain  in effect (meaning this test can be used) for the duration of the COVID-19 declaration under Section 564(b)(1) of the Act, 21 U.S.C. section  360bbb-3(b)(1), unless the authorization is terminated or revoked sooner.     Influenza A by PCR NEGATIVE NEGATIVE Final   Influenza B by PCR NEGATIVE NEGATIVE Final    Comment: (NOTE) The Xpert Xpress SARS-CoV-2/FLU/RSV assay is intended as an aid in  the diagnosis of influenza from Nasopharyngeal swab specimens and  should not be used as a sole basis for treatment. Nasal washings and  aspirates are unacceptable for Xpert Xpress SARS-CoV-2/FLU/RSV  testing.  Fact Sheet for Patients: PinkCheek.be  Fact Sheet for Healthcare Providers: GravelBags.it  This test is not yet approved or cleared by the [place] FDA and  has been authorized for detection and/or diagnosis of SARS-CoV-2 by  FDA under an Emergency Use Authorization (EUA). This EUA will remain  in effect (meaning this test can be used) for the duration of the  Covid-19 declaration under Section 564(b)(1) of the Act, 21  U.S.C. section 360bbb-3(b)(1), unless the authorization is  terminated or revoked. Performed at [facility], [place] [address], [place], [place] [address]   Respiratory Panel by RT PCR (Flu A&B, Covid) - Nasopharyngeal Swab     Status: None   Collection Time: 07/21/20  9:21 AM   Specimen: Nasopharyngeal Swab  Result Value Ref Range Status   SARS Coronavirus 2 by RT PCR NEGATIVE NEGATIVE Final    Comment: (NOTE) SARS-CoV-2 target nucleic acids are NOT DETECTED.  The SARS-CoV-2 RNA is generally detectable in upper respiratoy specimens during the acute phase of infection. The lowest concentration of SARS-CoV-2 viral copies this assay can detect is 131 copies/mL. A negative result does not preclude SARS-Cov-2 infection and should not be used as the sole basis for treatment or other patient management decisions. A negative result may occur with  improper specimen collection/handling, submission of specimen other than nasopharyngeal swab,  presence of viral mutation(s) within the areas targeted by this assay, and inadequate number of viral copies (<131 copies/mL). A negative result must be combined with clinical observations, patient history, and  epidemiological information. The expected result is Negative.  Fact Sheet for Patients:  PinkCheek.be  Fact Sheet for Healthcare Providers:  GravelBags.it  This test is no t yet approved or cleared by the [place] FDA and  has been authorized for detection and/or diagnosis of SARS-CoV-2 by FDA under an Emergency Use Authorization (EUA). This EUA will remain  in effect (meaning this test can be used) for the duration of the COVID-19 declaration under Section 564(b)(1) of the Act, 21 U.S.C. section 360bbb-3(b)(1), unless the authorization is terminated or revoked sooner.     Influenza A by PCR NEGATIVE NEGATIVE Final   Influenza B by PCR NEGATIVE NEGATIVE Final    Comment: (NOTE) The Xpert Xpress SARS-CoV-2/FLU/RSV assay is intended as an aid in  the diagnosis of influenza from Nasopharyngeal swab specimens and  should not be used as a sole basis for treatment. Nasal washings and  aspirates are unacceptable for Xpert Xpress SARS-CoV-2/FLU/RSV  testing.  Fact Sheet for Patients: PinkCheek.be  Fact Sheet for Healthcare Providers: GravelBags.it  This test is not yet approved or cleared by the [place] FDA and  has been authorized for detection and/or diagnosis of SARS-CoV-2 by  FDA under an Emergency Use Authorization (EUA). This EUA will remain  in effect (meaning this test can be used) for the duration of the  Covid-19 declaration under Section 564(b)(1) of the Act, 21  U.S.C. section 360bbb-3(b)(1), unless the authorization is  terminated or revoked. Performed at [facility], [place] [address], [place], [place] [address]   MRSA PCR  Screening     Status: None   Collection Time: 07/21/20  4:40 PM   Specimen: Nasopharyngeal  Result Value Ref Range Status   MRSA by PCR NEGATIVE NEGATIVE Final    Comment:        The GeneXpert MRSA Assay (FDA approved for NASAL specimens only), is one component of a comprehensive MRSA colonization surveillance program. It is not intended to diagnose MRSA infection nor to guide or monitor treatment for MRSA infections. Performed at [facility], [place] [address], [place], [place] [address]     Radiology Studies: No results found.  Scheduled Meds: . amLODipine  10 mg Oral Daily  . calcitRIOL  1 mcg Oral TID  . calcium carbonate  800 mg of elemental calcium Oral TID  . carvedilol  25 mg Oral BID WC  . Chlorhexidine Gluconate Cloth  6 each Topical Q0600  . Darbepoetin Alfa      . darbepoetin (ARANESP) injection - DIALYSIS  60 mcg Intravenous Q Mon-HD  . ferric citrate  420 mg Oral TID WC  . heparin  5,000 Units Subcutaneous Q8H  . hydrALAZINE  100 mg Oral TID  . isosorbide mononitrate  30 mg Oral Daily  . polyethylene glycol  17 g Oral Daily  . senna-docusate  1 tablet Oral BID   Continuous Infusions:    LOS: 2 days    Time spent: 24min Domenic Polite, MD [facility]  07/24/2020, 2:25 PM

## 2020-07-24 NOTE — Progress Notes (Signed)
CRITICAL VALUE ALERT  Critical Value: 6.4   Date & Time Notied: 07/24/20 @ 3:35am   Provider Notified: Dr. Marlowe Sax notified.   Orders Received/Actions taken: No new orders.

## 2020-07-24 NOTE — Progress Notes (Addendum)
[facility] Progress Note   Subjective:    Seen in room, eating breakfast. No numbness, tingling. No cp, sob. Says her mouth felt "dry" yesterday. Ca 6.4 this am.     Objective Vitals:   07/23/20 0814 07/23/20 1638 07/23/20 1735 07/23/20 2200  BP: 129/88 119/70 114/72 107/63  Pulse: 78 78 78 77  Resp: 16 16 17 18   Temp: 98.6 F (37 C) (!) 97.4 F (36.3 C) 97.9 F (36.6 C) 98.8 F (37.1 C)  TempSrc: Oral Oral    SpO2: 98% 95% 100% 97%  Weight:      Height:       Physical Exam General:WDWN female in NAD Heart:RRR Lungs:CTAB Abdomen:soft, NTND Extremities: no edema Dialysis Access: LU AVF +b/t   Filed Weights   07/21/20 1021 07/21/20 1354 07/21/20 2246  Weight: 54.4 kg 55.7 kg 55.8 kg    Intake/Output Summary (Last 24 hours) at 07/24/2020 0857 Last data filed at 07/24/2020 0700 Gross per 24 hour  Intake 960 ml  Output 0 ml  Net 960 ml    Additional Objective Labs: Basic Metabolic Panel: Recent Labs  Lab 07/18/20 0445 07/21/20 0846 07/21/20 1025 07/21/20 1025 07/22/20 0033 07/22/20 0033 07/22/20 1433 07/23/20 0217 07/24/20 0235  NA 136   < > 133*   < > 132*   134*   < > 138 137 135  K 6.5*   < > 6.0*   < > 5.7*   5.7*   < > 4.0 4.0 4.0  CL 96*   < > 92*   < > 90*   94*   < > 99 99 97*  CO2 23   < > 24   < > 23   23   < > 27 26 26   GLUCOSE 97   < > 77   < > 89   97   < > 116* 99 102*  BUN 63*   < > 53*   < > 61*   59*   < > 28* 33* 40*  CREATININE 9.55*   < > 7.86*   < > 8.78*   8.65*   < > 5.43* 6.07* 7.63*  CALCIUM 6.9*   < > 5.6*   < > 5.1*   5.3*   < > 7.0* 6.5* 6.4*  PHOS 8.5*  --  5.9*  --  6.2*   6.3*  --   --   --   --    < > = values in this interval not displayed.   Liver Function Tests: Recent Labs  Lab 07/22/20 1433 07/23/20 0217 07/24/20 0235  AST 24 21 20   ALT 8 7 7   ALKPHOS 205* 208* 265*  BILITOT 0.8 1.1 0.6  PROT 6.0* 6.1* 6.3*  ALBUMIN 3.0* 2.9* 3.1*   Recent Labs  Lab 07/21/20 0846  LIPASE 23    CBC: Recent Labs  Lab 07/18/20 0833 07/18/20 0833 07/21/20 0846 07/21/20 0930 07/21/20 1359 07/23/20 0217 07/24/20 0235  WBC 6.3   < > 10.3   < > 9.9 7.5 7.3  NEUTROABS  --   --   --   --  6.7  --   --   HGB 9.1*   < > 9.7*   < > 10.5* 8.4* 8.3*  HCT 29.8*   < > 31.1*   < > 33.2* 26.4* 26.7*  MCV 100.7*  --  100.3*  --  98.5 99.2 100.4*  PLT 105*   < > 158   < >  175 164 175   < > = values in this interval not displayed.   Medications:   amLODipine  10 mg Oral Daily   calcitRIOL  1 mcg Oral TID   calcium carbonate  2 tablet Oral TID   carvedilol  25 mg Oral BID WC   Chlorhexidine Gluconate Cloth  6 each Topical Q0600   darbepoetin (ARANESP) injection - DIALYSIS  60 mcg Intravenous Q Mon-HD   ferric citrate  420 mg Oral TID WC   heparin  5,000 Units Subcutaneous Q8H   hydrALAZINE  100 mg Oral TID   isosorbide mononitrate  30 mg Oral Daily    Dialysis Orders: [facility] MWF 3h 45 min 350/600 EDW 53.5 kg 2K/2Ca  AVF No heparin  Mircera 75 q 2 weeks (to start 11/8) OP Labs 11/3: Ca 10.1 Alb 4.2 Phos 9.6 PTH 2213  Assessment/Plan: 1. 2HPTH s/p total PTHectomy with autotransplant. 4 glands removed per Dr. Harlow Asa 11/8.Now complicated by hypocalcemia. S/p 2g IV calcium in ED, with additional 1g IV 11/13 and 11/14. Currently treating with calcitriol 79mcg PO TID and will increase to 2.4 g elemental po TID calcium for now. Dialyze with 3.5Ca dialysate. Ca 6.4 Corr Ca 7.1  Need to be in asymptomatic and stable in mid 7s for d/c. 2. ESRD -HD MWF. HD today per regular schedule.  3. Hyperkalemia - resolved. K 4.0. 4. Hypertension/volume - BP/volume ok. Continue home meds. UF to EDW as tolerated 5. Anemia - Hgb8.3.  Aranesp 34mcg with HD 11/15.  6. Metabolic bone disease -S/p PTHectomywith calcium plan per above.Continue Auryixa binder. 7. Nutrition -Renal diet with fluid restriction  8. Dipso - need to be asymptomatic, tolerating PO calcium/calcitriol with  improvement in Ca to prior to d/c. Need to see if can maintain stable Ca level on PO meds.   Lynnda Child PA-C [facility] 07/24/2020,8:58 AM  Pt seen, examined and agree w A/P as above.  Kelly Splinter  MD 07/24/2020, 3:45 PM

## 2020-07-25 LAB — RENAL FUNCTION PANEL
Albumin: 3.2 g/dL — ABNORMAL LOW (ref 3.5–5.0)
Anion gap: 13 (ref 5–15)
BUN: 23 mg/dL — ABNORMAL HIGH (ref 6–20)
CO2: 26 mmol/L (ref 22–32)
Calcium: 7.5 mg/dL — ABNORMAL LOW (ref 8.9–10.3)
Chloride: 99 mmol/L (ref 98–111)
Creatinine, Ser: 5.82 mg/dL — ABNORMAL HIGH (ref 0.44–1.00)
GFR, Estimated: 10 mL/min — ABNORMAL LOW (ref >60)
Glucose, Bld: 88 mg/dL (ref 70–99)
Phosphorus: 3.7 mg/dL (ref 2.5–4.6)
Potassium: 4 mmol/L (ref 3.5–5.1)
Sodium: 138 mmol/L (ref 135–145)

## 2020-07-25 MED ORDER — CALCIUM CARBONATE ANTACID 500 MG PO CHEW: 800.0000 mg | CHEWABLE_TABLET | Freq: Three times a day (TID) | ORAL | 1 refills | Status: AC

## 2020-07-25 MED ORDER — ENSURE ENLIVE PO LIQD: 237.0000 mL | Freq: Two times a day (BID) | ORAL | Status: DC

## 2020-07-25 MED ORDER — CALCITRIOL 0.5 MCG PO CAPS: 1.5000 ug | ORAL_CAPSULE | Freq: Two times a day (BID) | ORAL | Status: DC

## 2020-07-25 MED ORDER — CALCITRIOL 0.5 MCG PO CAPS: 1.5000 ug | ORAL_CAPSULE | Freq: Two times a day (BID) | ORAL | 1 refills | Status: AC

## 2020-07-25 MED ORDER — RENA-VITE PO TABS: 1.0000 | ORAL_TABLET | Freq: Every day | ORAL | Status: DC

## 2020-07-25 NOTE — Plan of Care (Signed)
  Problem: Education: Goal: Knowledge of General Education information will improve Description Including pain rating scale, medication(s)/side effects and non-pharmacologic comfort measures Outcome: Progressing   

## 2020-07-25 NOTE — Plan of Care (Signed)

## 2020-07-25 NOTE — Progress Notes (Signed)
Dominique Lawrence to be discharged Home per MD order. Discussed prescriptions and follow up appointments with the patient. Prescriptions given to patient; medication list explained in detail. Patient verbalized understanding.  Skin clean, dry and intact without evidence of skin break down, no evidence of skin tears noted. IV catheter discontinued intact. Site without signs and symptoms of complications. Dressing and pressure applied. Pt denies pain at the site currently. No complaints noted.  Patient free of lines, drains, and wounds.   An After Visit Summary (AVS) was printed and given to the patient. Patient escorted via wheelchair, and discharged home via private auto.  Orville Govern, RN

## 2020-07-25 NOTE — Progress Notes (Signed)
Initial Nutrition Assessment  DOCUMENTATION CODES:   Not applicable  INTERVENTION:   Ensure Enlive po BID, each supplement provides 350 kcal and 20 grams of protein  Renal MVI daily  Provided renal diet education  NUTRITION DIAGNOSIS:   Increased nutrient needs related to chronic illness (ESRD on HD) as evidenced by estimated needs.   GOAL:   Patient will meet greater than or equal to 90% of their needs    MONITOR:   PO intake, Supplement acceptance, I & O's, Weight trends, Labs  REASON FOR ASSESSMENT:   Consult Diet education  ASSESSMENT:   Pt with a PMH significant for Alport syndrome, ESRD on HD MWF s/p failed renal transplant, secondary hyperparathyroidism (underwent total parathyroidectomy with autotransplantation of parathyroid tissue to left forearm on 11/8), CHF, HTN, HLD, and depression admitted with severe symptomatic hypocalcemia.  Pt reports that she was fine postoperatively and was compliant with her HD sessions. Pt denies N/V/D and abdominal pain. Pt is frustrated and anxious to go home. Per MD, pt may be able to d/c today pending improvement in calcium levels.   PO Intake: 0-100% x 7 recorded meals (57% average meal intake)  EDW 53.5 kg Current wt: 56 kg Last HD 11/15, net UF 3L  Labs: Corrected Calcium 8.14 (L) Medications: rocaltrol TID, tums TID, aranesp, auryxia, miralax, senokot-s    NUTRITION - FOCUSED PHYSICAL EXAM:  Unable to perform at this time; will attempt at follow-up  Diet Order:   Diet Order            Diet renal with fluid restriction Fluid restriction: 1200 mL Fluid; Room service appropriate? Yes; Fluid consistency: Thin  Diet effective now                 EDUCATION NEEDS:   Education needs have been addressed  Skin:  Skin Assessment: Skin Integrity Issues: Skin Integrity Issues:: Incisions Incisions: neck, L arm  Last BM:  11/14  Height:   Ht Readings from Last 1 Encounters:  07/21/20 5\' 4"  (1.626 m)     Weight:   Wt Readings from Last 1 Encounters:  07/24/20 56 kg    BMI:  Body mass index is 21.19 kg/m.  Estimated Nutritional Needs:   Kcal:  1850-2050  Protein:  90-10 grams  Fluid:  1L + UOP    [name], MS, RD, LDN RD pager number and weekend/on-call pager number located in [place].

## 2020-07-25 NOTE — Care Management Important Message (Signed)
Important Message  Patient Details  Name: Dominique Lawrence MRN: [id number] Date of Birth: 2001/03/17   Medicare Important Message Given:  Yes     Jaymie Misch P Joao Mccurdy 07/25/2020, 2:05 PM

## 2020-07-25 NOTE — Progress Notes (Addendum)
[facility] Progress Note   Subjective:    Completed dialysis yesterday. No issues.  No complaints this am. No numbness/tingling, cp, sob. Wants to go home.  Ca 7.5 this am.    Objective Vitals:   07/24/20 1545 07/24/20 2306 07/25/20 0548 07/25/20 1000  BP: (!) 165/106 112/61 116/75 118/70  Pulse: 90 79 78 78  Resp: 20 16 17 20   Temp: 98.5 F (36.9 C) 99.6 F (37.6 C) 98.3 F (36.8 C) 98.4 F (36.9 C)  TempSrc: Oral  Oral   SpO2: 100% 98% 97% 100%  Weight:      Height:       Physical Exam General:WDWN female in NAD Heart:RRR Lungs:CTAB Abdomen:soft, NTND Extremities: no edema Dialysis Access: LU AVF +b/t   Filed Weights   07/21/20 2246 07/24/20 1125 07/24/20 1500  Weight: 55.8 kg 59 kg 56 kg    Intake/Output Summary (Last 24 hours) at 07/25/2020 1058 Last data filed at 07/25/2020 0700 Gross per 24 hour  Intake 240 ml  Output 3000 ml  Net -2760 ml    Additional Objective Labs: Basic Metabolic Panel: Recent Labs  Lab 07/21/20 1025 07/21/20 1025 07/22/20 0033 07/22/20 1433 07/24/20 0235 07/24/20 2230 07/25/20 0914  NA 133*   < > 132*  134*   < > 135 138 138  K 6.0*   < > 5.7*  5.7*   < > 4.0 3.9 4.0  CL 92*   < > 90*  94*   < > 97* 101 99  CO2 24   < > 23  23   < > 26 26 26   GLUCOSE 77   < > 89  97   < > 102* 107* 88  BUN 53*   < > 61*  59*   < > 40* 22* 23*  CREATININE 7.86*   < > 8.78*  8.65*   < > 7.63* 4.92* 5.82*  CALCIUM 5.6*   < > 5.1*  5.3*   < > 6.4* 8.1* 7.5*  PHOS 5.9*  --  6.2*  6.3*  --   --   --  3.7   < > = values in this interval not displayed.   Liver Function Tests: Recent Labs  Lab 07/22/20 1433 07/22/20 1433 07/23/20 0217 07/24/20 0235 07/25/20 0914  AST 24  --  21 20  --   ALT 8  --  7 7  --   ALKPHOS 205*  --  208* 265*  --   BILITOT 0.8  --  1.1 0.6  --   PROT 6.0*  --  6.1* 6.3*  --   ALBUMIN 3.0*   < > 2.9* 3.1* 3.2*   < > = values in this interval not displayed.   Recent Labs  Lab  07/21/20 0846  LIPASE 23   CBC: Recent Labs  Lab 07/21/20 0846 07/21/20 0930 07/21/20 1359 07/23/20 0217 07/24/20 0235  WBC 10.3   < > 9.9 7.5 7.3  NEUTROABS  --   --  6.7  --   --   HGB 9.7*   < > 10.5* 8.4* 8.3*  HCT 31.1*   < > 33.2* 26.4* 26.7*  MCV 100.3*  --  98.5 99.2 100.4*  PLT 158   < > 175 164 175   < > = values in this interval not displayed.   Medications:  . amLODipine  10 mg Oral Daily  . calcitRIOL  1 mcg Oral TID  . calcium carbonate  800  mg of elemental calcium Oral TID  . carvedilol  25 mg Oral BID WC  . Chlorhexidine Gluconate Cloth  6 each Topical Q0600  . darbepoetin (ARANESP) injection - DIALYSIS  60 mcg Intravenous Q Mon-HD  . feeding supplement  237 mL Oral BID BM  . ferric citrate  420 mg Oral TID WC  . heparin  5,000 Units Subcutaneous Q8H  . hydrALAZINE  100 mg Oral TID  . isosorbide mononitrate  30 mg Oral Daily  . multivitamin  1 tablet Oral QHS  . polyethylene glycol  17 g Oral Daily  . senna-docusate  1 tablet Oral BID    Dialysis Orders: [facility] MWF 3h 45 min 350/600 EDW 53.5 kg 2K/2Ca  AVF No heparin  Mircera 75 q 2 weeks (to start 11/8) OP Labs 11/3: Ca 10.1 Alb 4.2 Phos 9.6 PTH 2213  Assessment/Plan: 1. 2HPTH s/p total PTHectomy with autotransplant. 4 glands removed per Dr. Harlow Asa 11/8.Now complicated by hypocalcemia. S/p 2g IV calcium in ED, with additional 1g IV 11/13 and 11/14. Currently treating with calcitriol 1.5 mcg PO BID and have increased Ca++ supp to 800 mg elemental po TID (total = 2.4 gm elemental Ca/ day) for now. Dialyze with 3.5Ca dialysate. She is asymptomatic. Ca 7.5, Corr Ca 8.1  2. ESRD -HD MWF. Next HD 11/17.  3. Hyperkalemia - resolved. K 4.0. 4. Hypertension/volume - BP/volume ok. Continue home meds. UF to EDW as tolerated. Net UF 3L on 11/15. Not reaching EDW this admission 5. Anemia - Hgb8.3.  Aranesp 59mcg with HD 11/15. Tsat 22% Resume IV Fe as outpatient  6. Metabolic bone disease -S/p  PTHectomywith calcium plan per above.Continue Auryixa binder.  7. Nutrition -Renal diet with fluid restriction  Dipso - Asymptomatic. Ca has improved to mid 7s-8s on PO meds. Will update calcium supplement orders for discharge. Will continue to monitor calcium levels and supplementation as outpatient. Boon for discharge from renal standpoint.    Lynnda Child PA-C [facility] 07/25/2020,10:58 AM    Pt seen, examined and agree w assess/plan as above with additions as indicated.  [facility] 07/25/2020, 5:20 PM

## 2020-08-02 NOTE — Discharge Summary (Addendum)
Physician Discharge Summary  Dominique Lawrence MCN:[id number] DOB: [birth year] DOA: 07/21/2020  PCP: Carmie End, MD  Admit date: 07/21/2020 Discharge date: 07/25/2020  Time spent: 35 minutes  Recommendations for Outpatient Follow-up:  1. Please check calcium at next dialysis 2. Dialyze with 3.5 calcium dialysate   Discharge Diagnoses:  Severe hypercalcemia   Alport's syndrome   Elevated troponin   Hypertension   ESRD (end stage renal disease) on dialysis [facility])   Secondary hyperparathyroidism of renal origin ([place])   Chest pain   Prolonged QT interval   Chronic systolic CHF (congestive heart failure) ([place])   Hypocalcemia   H/O parathyroidectomy ([place])   Hyperkalemia   Ascites   Discharge Condition: Stable  Diet recommendation: Renal diet  Filed Weights   07/21/20 2246 07/24/20 1125 07/24/20 1500  Weight: 55.8 kg 59 kg 56 kg    History of present illness:  18/F with past medical history of Alport syndrome, ESRD on hemodialysis Monday Wednesday Friday,, chronic systolic CHF, hypertension, dyslipidemia, depression, secondary hyperparathyroidism underwent total parathyroidectomy with autotransplantation of parathyroid tissue to left forearm on 11/8.  -She presented to the ED with chest arm and leg pain with tingling -On further evaluation she she was noted to have severe hypocalcemia with total calcium of 5.2 and ionized calcium of 0.5, potassium was 6.2, CT abdomen pelvis showed large ascites   Hospital Course:   Severe symptomatic hypocalcemia Recent total parathyroidectomy for secondary hyperparathyroidism, autotransplant -Calcium was 5.2 with symptoms on admission, received a total of 4 g of IV calcium gluconate this admission -Also started on calcitriol 1 mcg  TID and calcium carbonate 3 times daily -Nephrology recommended calcitriol 1.5 mcg twice daily and calcium carbonate 800 mg p.o. 3 times daily along with dialysis using 2.5 calcium  dialysate -Calcium has improved to 7.5, corrected calcium closer to 8. -Advised to recheck and monitor calcium levels at next dialysis  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 -Continue carvedilol, Entresto discontinued due to hyperkalemia  Hypertension -Continue carvedilol, hydralazine, amlodipine  Anemia of chronic disease -Hemoglobin stable, continue EPO  Consultations:  Nephrology  Discharge Exam: Vitals:   07/25/20 1000 07/25/20 1251  BP: 118/70 112/68  Pulse: 78 85  Resp: 20 18  Temp: 98.4 F (36.9 C) 99.1 F (37.3 C)  SpO2: 100% 97%    General: Alert awake oriented x3 Cardiovascular: S1-S2, regular rate rhythm Respiratory: Clear  Discharge Instructions    Allergies as of 07/25/2020      Reactions   Enalapril Other (See Comments)   AKI   Chlorhexidine Gluconate Hives   Ibuprofen Other (See Comments)   Pt not able to take ibuprofen due to Alport's syndrome      Medication List    STOP taking these medications   calcium carbonate 1250 (500 Ca) MG tablet Commonly known as: OS-CAL - dosed in mg of elemental calcium   Entresto 49-51 MG Generic drug: sacubitril-valsartan     TAKE these medications   acetaminophen 500 MG tablet Commonly known as: TYLENOL Take 500 mg by mouth every 6 (six) hours as needed for moderate pain or headache.   amLODipine 10 MG tablet Commonly known as: NORVASC TAKE 1 TABLET(10  MG) BY MOUTH DAILY What changed: See the new instructions.   Auryxia 1 GM 210 MG(Fe) tablet Generic drug: ferric citrate Take 420 mg by mouth 3 (three) times daily  with meals.   calcitRIOL 0.5 MCG capsule Commonly known as: ROCALTROL Take 3 capsules (1.5 mcg total) by mouth in the morning and at bedtime. What changed: how much to take   calcium carbonate 500 MG chewable tablet Commonly known as: TUMS - dosed in mg elemental calcium Chew 4 tablets (800 mg of elemental calcium total) by mouth 3 (three) times daily.   carvedilol 25 MG tablet Commonly known as: COREG TAKE 1 TABLET(25 MG) BY MOUTH TWICE DAILY WITH A MEAL What changed: See the new instructions.   heparin 1000 unit/mL Soln injection Heparin Sodium (Porcine) 1,000 Units/mL Catheter Lock Venous   hydrALAZINE 100 MG tablet Commonly known as: APRESOLINE Take 100 mg by mouth 3 (three) times daily. What changed: Another medication with the same name was removed. Continue taking this medication, and follow the directions you see here.   isosorbide mononitrate 30 MG 24 hr tablet Commonly known as: IMDUR Take 1 tablet (30 mg total) by mouth daily.   MIRCERA IJ See admin instructions. Pt receives at [facility] on [address]. Pt receives a dose based on labs during visit.   Nexplanon 68 MG Impl implant Generic drug: etonogestrel 68 mg by Subdermal route once. Jan 2018   traMADol 50 MG tablet Commonly known as: ULTRAM Take 1-2 tablets (50-100 mg total) by mouth every 6 (six) hours as needed for moderate pain or severe pain.      Allergies  Allergen Reactions  . Enalapril Other (See Comments)    AKI  . Chlorhexidine Gluconate Hives  . Ibuprofen Other (See Comments)    Pt not able to take ibuprofen due to Alport's syndrome    Follow-up Information    Ettefagh, Paul Dykes, MD. Schedule an appointment as soon as possible for a visit in 1 week(s).   Specialty: Pediatrics Contact information: [address] [place] [address] [phone]                The results of significant diagnostics from this hospitalization (including imaging,  microbiology, ancillary and laboratory) are listed below for reference.    Significant Diagnostic Studies: DG Chest 2 View  Result Date: 07/12/2020 CLINICAL DATA:  Cough EXAM: CHEST - 2 VIEW COMPARISON:  10/21/2019 chest radiograph and prior. FINDINGS: Interval removal of right IJ CVC. Cardiomegaly. Hypoinflated lungs. No focal consolidation. Mild interstitial and central pulmonary vascular prominence. IMPRESSION: No focal consolidation. Cardiomegaly. Mild central pulmonary/interstitial prominence may reflect edema. Electronically Signed   By: Primitivo Gauze M.D.   On: 07/12/2020 13:49   CT Angio Chest PE W and/or Wo Contrast  Result Date: 07/21/2020 CLINICAL DATA:  Upper chest heaviness since last night EXAM: CT ANGIOGRAPHY CHEST WITH CONTRAST TECHNIQUE: Multidetector CT imaging of the chest was performed using the standard protocol during bolus administration of intravenous contrast. Multiplanar CT image reconstructions and MIPs were obtained to evaluate the vascular anatomy. CONTRAST:  70 cc Omnipaque 350 intravenous COMPARISON:  None. FINDINGS: Cardiovascular: Cardiomegaly without pericardial effusion. No pulmonary artery filling defect. Limited opacification of the left heart and aorta. Mediastinum/Nodes: No adenopathy, gas, or collection. Postoperative swelling and gas in the lower neck correlating with recent parathyroidectomy history. No visible tracheal narrowing Lungs/Pleura: Hazy density of the bilateral lungs with immediate subpleural sparing. This appearance improves on the subsequent abdomen  and pelvis CT and is likely from expiratory phase. No septal lines, effusion, or pneumothorax. Upper Abdomen: Reported separately Musculoskeletal: Generalized bony sclerosis with subchondral erosions at least at the medial clavicles. No acute finding Review of the MIP images confirms the above findings. IMPRESSION: 1. Negative for pulmonary embolism. 2. Recent parathyroidectomy with partially covered  site. No visible airway narrowing. 3. Cardiomegaly. 4. Renal osteodystrophy. Electronically Signed   By: Monte Fantasia M.D.   On: 07/21/2020 10:24   CT ABDOMEN PELVIS W CONTRAST  Result Date: 07/21/2020 CLINICAL DATA:  Bowel obstruction suspected. Concern for ileus after recent surgery EXAM: CT ABDOMEN AND PELVIS WITH CONTRAST TECHNIQUE: Multidetector CT imaging of the abdomen and pelvis was performed using the standard protocol following bolus administration of intravenous contrast. CONTRAST:  64mL OMNIPAQUE IOHEXOL 350 MG/ML SOLN COMPARISON:  10/23/2017 FINDINGS: Lower chest:  Reported separately Hepatobiliary: Heterogeneous density of the liver. There is intrahepatic venous reflux and passive congestion is suspected. Small cystic density in the inferior right lobe. No evidence of biliary obstruction or stone. Pancreas: Unremarkable. Spleen: Unremarkable. Adrenals/Urinary Tract: Negative adrenals. Severe native renal atrophy. 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 Small right renal cystic density. Unremarkable bladder. Stomach/Bowel: No visible bowel obstruction or inflammation. No ileus. No appendicitis. Vascular/Lymphatic: No acute vascular abnormality. Postoperative distortion of the right external iliac artery related transplant. No mass or adenopathy. Reproductive:No pathologic findings. Other: Large volume ascites without visible loculation or peritoneal nodularity. No Tenckhoff catheter in place. Musculoskeletal: Renal osteodystrophy with diffuse sclerosis of bones and bilateral sacroiliac subchondral erosion. IMPRESSION: 1. Large volume ascites, new from 2019. 2. Heterogeneous liver density, suspect passive congestion. 3. No bowel obstruction or ileus. Electronically Signed   By: Monte Fantasia M.D.   On: 07/21/2020 10:27    Microbiology: No results found for this or any previous visit (from the past 240 hour(s)).    Labs: Basic Metabolic Panel: No results for input(s): NA, K, CL, CO2, GLUCOSE, BUN, CREATININE, CALCIUM, MG, PHOS in the last 168 hours. Liver Function Tests: No results for input(s): AST, ALT, ALKPHOS, BILITOT, PROT, ALBUMIN in the last 168 hours. No results for input(s): LIPASE, AMYLASE in the last 168 hours. No results for input(s): AMMONIA in the last 168 hours. CBC: No results for input(s): WBC, NEUTROABS, HGB, HCT, MCV, PLT in the last 168 hours. Cardiac Enzymes: No results for input(s): CKTOTAL, CKMB, CKMBINDEX, TROPONINI in the last 168 hours. BNP: BNP (last 3 results) Recent Labs    08/23/19 0441 08/24/19 0540  BNP >4,500.0* >4,500.0*    ProBNP (last 3 results) No results for input(s): PROBNP in the last 8760 hours.  CBG: No results for input(s): GLUCAP in the last 168 hours.     Signed:  Domenic Polite MD.  [facility] 08/02/2020, 3:18 PM

## 2020-08-31 ENCOUNTER — Encounter (HOSPITAL_COMMUNITY): Payer: Medicare Other | Admitting: Internal Medicine

## 2020-08-31 ENCOUNTER — Ambulatory Visit (HOSPITAL_COMMUNITY): Payer: Medicare Other

## 2020-10-03 ENCOUNTER — Other Ambulatory Visit: Payer: Self-pay

## 2020-10-03 ENCOUNTER — Ambulatory Visit (INDEPENDENT_AMBULATORY_CARE_PROVIDER_SITE_OTHER): Payer: Medicaid Other | Admitting: Family

## 2020-10-03 ENCOUNTER — Other Ambulatory Visit (HOSPITAL_COMMUNITY)
Admission: RE | Admit: 2020-10-03 | Discharge: 2020-10-03 | Disposition: A | Discharge status: Home / Self Care | Payer: Medicare Other | Source: Ambulatory Visit | Attending: Family | Admitting: Family

## 2020-10-03 ENCOUNTER — Encounter: Payer: Self-pay | Admitting: Family

## 2020-10-03 VITALS — BP 152/93 | HR 93 | Ht 64.57 in | Wt 119.8 lb

## 2020-10-03 DIAGNOSIS — M24522 Contracture, left elbow: Secondary | ICD-10-CM

## 2020-10-03 DIAGNOSIS — N186 End stage renal disease: Secondary | ICD-10-CM

## 2020-10-03 DIAGNOSIS — Z975 Presence of (intrauterine) contraceptive device: Secondary | ICD-10-CM | POA: Diagnosis not present

## 2020-10-03 DIAGNOSIS — Z992 Dependence on renal dialysis: Secondary | ICD-10-CM

## 2020-10-03 DIAGNOSIS — I770 Arteriovenous fistula, acquired: Secondary | ICD-10-CM

## 2020-10-03 DIAGNOSIS — Z124 Encounter for screening for malignant neoplasm of cervix: Secondary | ICD-10-CM | POA: Insufficient documentation

## 2020-10-03 NOTE — Progress Notes (Signed)
History was provided by the patient.  Dominique Lawrence is a 20 y.o. female who is here for two concerns:   1) needs PAP for kidney transplant clearance 2) nexplanon due for replacement; last insertion was 09/27/2017. Had AV fistula placed in LUE in 02/10/2020  PCP confirmed? Yes.    Ettefagh, Paul Dykes, MD  HPI:   Dominique Lawrence is a 20 yo female presenting for PAP screening clearance for potential 2nd kidney transplant. The patient reports she feels well today and denies active symptoms.  Her Nexplanon is in correct placement LUE with no concerns for breakthrough bleeding, cramping or vaginal discharge or lesions. She is unable to straighten her L arm since AVF and follows up with vascular next month for this. PT recommended however she reports she was not called/scheduled. She is currently sexually active with one female partner; denies pregnancy intention; would like to continue with Nexplanon for contraception and is inquiring about removal and replacement in the context of LUE AVF for dialysis.   Her medical history is complex and the following was reviewed and confirmed from 09/14/20 visit for pre-transplant evaluation:   Cause of Kidney Disease: Alport syndrome, diagnosis by biopsy 01/23/2011 On dialysis?: yes, on in-center HD since June 2021 via LUE AVF; Mon, Wed, Friday Prior transplants:08/12/2018 (kidney transplant [facility], right side, 2 rejection episodes, off all immunosuppression since several months after dialysis, had transplant nephrectomy August 2020 due to symptomatic rejection and off anti-rejection meds in Sept 2020. Prior blood product transfusions: yes, last 2020 Pregnancy history: G0P0, Nexplanon in place since removal and replacement on 09/27/2017 Major Infections: no Frequent UTI: no History of Heart disease: history of pericarditis requiring pericardiocentesis, Feb 2021, has some chronic dyspnea on exertion History of Lung disease: no History of Liver  disease:no History of Diabetes Mellitus: no History of Hypertension: yes, takes amlodipine 10 mg, carvedilol 25 mg BID, Entresto 49-51, hydralazine 100 mg TID, Isordil 30 mg daily History of Peripheral Vascular Disease: no History of Stroke: no History of Neurologic disease: no History of Cancer: no, needs PAP clearance reason for visit today  History of Autoimmune disease: no History of Coagulation Disorder: no History of Urinary Tract Abnormalities: no Psychiatric History: no; no concerns today, no SI/HI, no therapist   Other Significant Medical History: 1) 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 Per cardiologist Dr. Haroldine Laws at [facility] 06/01/20: "If EF not improving with GDMT and management of HTN may need to consider heart/kidney transplant eval but non-compliance and loss of previously kidney transplant are major barriers." 4) Pericardial effusion Jan 2021 treated with pericardial window 10/08/19 with 700 mL removed (Cytology, ANA, ANCA, RF, congo red stain negative. Cytology with reactive mesothelial cells.)    Patient Active Problem List   Diagnosis Date Noted  . Chest pain 07/21/2020  . Prolonged QT interval 07/21/2020  . Chronic systolic CHF (congestive heart failure) ([place]) 07/21/2020  . Hypocalcemia 07/21/2020  . H/O parathyroidectomy ([place]) 07/21/2020  . Hyperkalemia 07/21/2020  . Ascites 07/21/2020  . Right thyroid nodule 07/15/2020  . 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 Pericardial effusion 10/07/2019  . Secondary hyperparathyroidism of renal origin ([place]) 09/15/2019  . Dyspnea 08/23/2019  . COVID-19 virus infection 08/23/2019  . Tachycardia 08/23/2019  .  Elevated troponin  08/23/2019  . Hypertension 08/23/2019  . ESRD (end stage renal disease) on dialysis ([place]) 08/23/2019  . Severe anemia 02/26/2019  . Nexplanon in place 11/23/2017  . Avitaminosis D 12/23/2015  . Acne vulgaris 09/25/2015  . Kidney cysts 09/13/2015  . Renal insufficiency 09/12/2015  . Constipation 07/07/2015  . Hearing loss 03/01/2015  . Failed vision screen 09/14/2014  . Chronic kidney disease (CKD) stage G1/A1, glomerular filtration rate (GFR) equal to or greater than 90 mL/min/1.73 square meter and albuminuria creatinine ratio less than 30 mg/g 11/13/2013  . 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  . 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 Overweight, pediatric, BMI 85.0-94.9 percentile for age 27/19/2014    Current Outpatient Medications on File Prior to Visit  Medication Sig Dispense Refill  . acetaminophen (TYLENOL) 500 MG tablet Take 500 mg by mouth every 6 (six) hours as needed for moderate pain or headache.    Marland Kitchen amLODipine (NORVASC) 10 MG tablet TAKE 1 TABLET(10 MG) BY MOUTH DAILY (Patient taking differently: Take 10 mg by mouth daily.) 90 tablet 1  . AURYXIA 1 GM 210 MG(Fe) tablet Take 420 mg by mouth 3 (three) times daily with meals.     . carvedilol (COREG) 25 MG tablet TAKE 1 TABLET(25 MG) BY MOUTH TWICE DAILY WITH A MEAL (Patient taking differently: Take 25 mg by mouth 2 (two) times daily with a meal.) 180 tablet 2  . etonogestrel (NEXPLANON) 68 MG IMPL implant 68 mg by Subdermal route once. Jan 2018    . hydrALAZINE (APRESOLINE) 100 MG tablet Take 100 mg by mouth 3 (three) times daily.    . Methoxy PEG-Epoetin Beta (MIRCERA IJ) See admin instructions. Pt receives at [facility] on [address]. Pt receives a dose based on labs during visit.    . heparin 1000 unit/mL SOLN injection Heparin Sodium (Porcine) 1,000 Units/mL Catheter Lock Venous (Patient not taking: Reported on 10/03/2020)    . isosorbide mononitrate (IMDUR) 30 MG 24 hr tablet Take 1  tablet (30 mg total) by mouth daily. 90 tablet 3  . traMADol (ULTRAM) 50 MG tablet Take 1-2 tablets (50-100 mg total) by mouth every 6 (six) hours as needed for moderate pain or severe pain. (Patient not taking: Reported on 10/03/2020) 15 tablet 0   No current facility-administered medications on file prior to visit.    Allergies  Allergen Reactions  . Enalapril Other (See Comments)    AKI  . Chlorhexidine Gluconate Hives  . Ibuprofen Other (See Comments)    Pt not able to take ibuprofen due to Alport's syndrome    Physical Exam:    Vitals:   10/03/20 0908  BP: (!) 152/93  Pulse: 93  Weight: 119 lb 12.8 oz (54.3 kg)  Height: 5' 4.57" (1.64 m)    Growth percentile SmartLinks can only be used for patients less than 66 years old. No LMP recorded. Patient has had an implant.  Physical Exam Constitutional:      Appearance: She is not toxic-appearing.  HENT:     Mouth/Throat:     Pharynx: Oropharynx is clear.  Eyes:     General: Scleral icterus present.     Extraocular Movements: Extraocular movements intact.     Pupils: Pupils are equal, round, and reactive to light.  Cardiovascular:     Rate and Rhythm: Normal rate and regular rhythm.     Heart sounds: No murmur heard.   Pulmonary:     Effort: Pulmonary effort is normal.  Musculoskeletal:  Comments: L elbow flexion contracture    Skin:    General: Skin is warm and dry.     Findings: No rash.     Comments: AVF LUE Implant palpable in LUE correct position   Neurological:     General: No focal deficit present.     Mental Status: She is alert and oriented to person, place, and time.  Psychiatric:        Mood and Affect: Mood normal.     Assessment/Plan:  20 yo assigned female at birth/identifies as female with complex medical history significant for ESRD secondary to Alport's Syndrome with potential 2nd kidney implant. She presents for PAP screening as part of her clearance and for continued management of  contraception by nexplanon. Reviewed current PAP guidelines for screenings beginning at 25 years, however PAP completed today to assist with next steps in transplant clearance.See media for photo of contracture, implant, and AVF in LUE. Will consult with Dr Lenore Cordia for guidance/recommendations on removal and replacement.   1. ESRD (end stage renal disease) on dialysis ([place]) 2. Flexion contracture of elbow, left  3. A-V fistula ([place]) 4. Nexplanon in place 5. Pap smear for cervical cancer screening

## 2020-10-05 LAB — CYTOLOGY - PAP
Adequacy: ABSENT
Chlamydia: NEGATIVE
Comment: NEGATIVE
Comment: NEGATIVE
Comment: NORMAL
Diagnosis: NEGATIVE
Neisseria Gonorrhea: NEGATIVE
Trichomonas: NEGATIVE

## 2020-10-10 ENCOUNTER — Telehealth: Payer: Self-pay

## 2020-10-10 NOTE — Telephone Encounter (Signed)
Dominique Lawrence called and LVM on nurse line stating she apologized for missing Dominique Lawrence's call yesterday, she was receiving dialysis and unable to talk. Dominique Lawrence stated she can be reached today on her cell: [phone].

## 2020-10-10 NOTE — Telephone Encounter (Signed)
Spoke with patient. Relayed normal results. She also wanted to inquire about birth control removal. Will touch base with Christy for recommendation.

## 2020-10-11 NOTE — Telephone Encounter (Signed)
Would need to be scheduled with Dr Henrene Pastor for removal/replacement.

## 2020-10-16 NOTE — Telephone Encounter (Signed)
Spoke with pt, scheduled for march 8.

## 2020-10-19 ENCOUNTER — Encounter (HOSPITAL_COMMUNITY): Payer: Self-pay | Admitting: Internal Medicine

## 2020-10-19 ENCOUNTER — Ambulatory Visit (HOSPITAL_BASED_OUTPATIENT_CLINIC_OR_DEPARTMENT_OTHER)
Admission: RE | Admit: 2020-10-19 | Discharge: 2020-10-19 | Disposition: A | Discharge status: Home / Self Care | Payer: Medicare Other | Source: Ambulatory Visit | Attending: Internal Medicine | Admitting: Internal Medicine

## 2020-10-19 ENCOUNTER — Ambulatory Visit (HOSPITAL_COMMUNITY)
Admission: RE | Admit: 2020-10-19 | Discharge: 2020-10-19 | Disposition: A | Discharge status: Home / Self Care | Payer: Medicare Other | Source: Ambulatory Visit | Attending: Internal Medicine | Admitting: Internal Medicine

## 2020-10-19 ENCOUNTER — Other Ambulatory Visit: Payer: Self-pay

## 2020-10-19 VITALS — BP 150/90 | HR 85 | Wt 122.0 lb

## 2020-10-19 DIAGNOSIS — Z8616 Personal history of COVID-19: Secondary | ICD-10-CM | POA: Insufficient documentation

## 2020-10-19 DIAGNOSIS — Z79899 Other long term (current) drug therapy: Secondary | ICD-10-CM | POA: Diagnosis not present

## 2020-10-19 DIAGNOSIS — Z992 Dependence on renal dialysis: Secondary | ICD-10-CM | POA: Insufficient documentation

## 2020-10-19 DIAGNOSIS — I313 Pericardial effusion (noninflammatory): Secondary | ICD-10-CM

## 2020-10-19 DIAGNOSIS — I5022 Chronic systolic (congestive) heart failure: Secondary | ICD-10-CM

## 2020-10-19 DIAGNOSIS — I10 Essential (primary) hypertension: Secondary | ICD-10-CM

## 2020-10-19 DIAGNOSIS — I132 Hypertensive heart and chronic kidney disease with heart failure and with stage 5 chronic kidney disease, or end stage renal disease: Secondary | ICD-10-CM | POA: Diagnosis not present

## 2020-10-19 DIAGNOSIS — N186 End stage renal disease: Secondary | ICD-10-CM

## 2020-10-19 DIAGNOSIS — Z7901 Long term (current) use of anticoagulants: Secondary | ICD-10-CM | POA: Insufficient documentation

## 2020-10-19 DIAGNOSIS — I3139 Other pericardial effusion (noninflammatory): Secondary | ICD-10-CM

## 2020-10-19 DIAGNOSIS — R221 Localized swelling, mass and lump, neck: Secondary | ICD-10-CM | POA: Insufficient documentation

## 2020-10-19 LAB — ECHOCARDIOGRAM COMPLETE
Area-P 1/2: 3.17 cm2
MV VTI: 2.24 cm2
S' Lateral: 3.4 cm

## 2020-10-19 MED ORDER — ENTRESTO 97-103 MG PO TABS
1.0000 | ORAL_TABLET | Freq: Two times a day (BID) | ORAL | 3 refills | Status: DC
Start: 2020-10-19 — End: 2022-10-10

## 2020-10-19 NOTE — Addendum Note (Signed)
Encounter addended by: Malena Edman, RN on: 10/19/2020 12:10 PM  Actions taken: Order Reconciliation Section accessed, Order list changed, Clinical Note Signed

## 2020-10-19 NOTE — Progress Notes (Signed)
[facility]  PCP: Dr Paul Dykes Nephrology: Dr Justin Mend  Primary HF Cardiologist: Dr Haroldine Laws.  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 She is on HD MWF and followed by Dr. Justin Mend. 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 Now w/ subsequent secondary hyperparathyroidism, needing totalparathyroidectomy.   Of note, she was diagnosed w/ Covid 19 infection 08/2019 and hospitalized, treated w/decadron and remdesivir. Had 2D echo 08/23/19 showing moderate pericardial effusionbut no tamponade physiology,moderate MR/TR and severely reduced LVEF 25-30%. Hs troponin level was mildly elevated but flat trend, 72>>79. ? Myocarditis. Unable to get cMRi due to renal function. HF was treated w/ lasix + HD for volume management + coreg and hydralazine.   She was seen in general cardiology clinic 10/06/19 by Dr. Johnsie Cancel for preoperative clearance prior to plannedparathyroidectomy. Echo was repeated and demonstrated increased size of pericardial effusion, w/ progression from moderate to severe. No evidence of tamponade. LVEF 30-35%. RV normal size w/ hyperdynamic systolic function.   Admitted 10/07/19 after RHC/LHC for planned pericardial window. Cath showed normal coronaries, mild pulmonary HTN, EF 35%, and large pericardial effusion. On 10/08/19 had pericardial window with 700 cc removed. Cytology, ANA, ANCA, RF, congo red stain negative. Cytology with reactive mesothelial cells.   Today she returns for HF follow up. Feels good. SBP 130-140s on non-HD days. Just before HD it goes up to 170. After HD SBP 120-130. No SOB, orthopnea or PND. Edema controlled with HD. No low BPs at HD.    Echo today EF ~55% Trivial MR/TR. No effusion. Personally reviewed  Unable to tolerate Bidil due to severe HAs.    Echo 01/25/20 EF 25-30% RV moderately down. Severe MR/TR Small effusion      ROS: All systems negative except as listed in HPI, PMH and Problem List.  SH:  Social History   Socioeconomic History  . Marital status: Married    Spouse name: Not on file  . Number of children: Not on file  . Years of education: Not on file  . Highest education level: Not on file  Occupational History  . Not on file  Tobacco Use  . Smoking status: Never Smoker  . Smokeless tobacco: Never Used  Vaping Use  . Vaping Use: Never used  Substance and Sexual Activity  . Alcohol use: No  . Drug use: No  . Sexual activity: Not Currently    Birth control/protection: Implant    Comment: Nexplanon 09/2016  Other Topics Concern  . Not on file  Social History Narrative   Lives with Mom, Sister, & 2 Brothers. No pets at home. No one smokes.   Social Determinants of Health   Financial Resource Strain: Not on file  Food Insecurity: Not on file  Transportation Needs: Not on file  Physical Activity: Not on file  Stress: Not on file  Social Connections: Not on file  Intimate Partner Violence: Not on file    FH:  Family History  Problem Relation Age of Onset  . Alport syndrome Brother     Past Medical History:  Diagnosis Date  . Alport syndrome   . Anemia   . Chronic systolic CHF (congestive heart failure) ([place])   . Constipation 12/18/2013  . Depression 07/28/2013  . ESRD (end stage renal disease) ([place])     Transplant failed Hemo MWF at [facility].  Marland Kitchen  Hearing loss    Bilateral hearing aids  . History of blood transfusion   . Hypertension   . Influenza B   . S/P wisdom tooth extraction 06/22/2016   Right mandibular dental pain  . Sinus tachycardia   . Wears glasses     Current Outpatient Medications  Medication Sig Dispense Refill  . acetaminophen (TYLENOL) 500 MG tablet Take 500 mg by mouth every 6 (six) hours as needed for moderate pain or headache.    Marland Kitchen amLODipine (NORVASC) 10 MG tablet TAKE 1 TABLET(10 MG) BY MOUTH DAILY 90 tablet  1  . AURYXIA 1 GM 210 MG(Fe) tablet Take 420 mg by mouth 3 (three) times daily with meals.     . carvedilol (COREG) 25 MG tablet TAKE 1 TABLET(25 MG) BY MOUTH TWICE DAILY WITH A MEAL 180 tablet 2  . etonogestrel (NEXPLANON) 68 MG IMPL implant 68 mg by Subdermal route once. Jan 2018    . hydrALAZINE (APRESOLINE) 100 MG tablet Take 100 mg by mouth 3 (three) times daily.    . isosorbide mononitrate (IMDUR) 30 MG 24 hr tablet Take 1 tablet (30 mg total) by mouth daily. 90 tablet 3  . Methoxy PEG-Epoetin Beta (MIRCERA IJ) See admin instructions. Pt receives at [facility] on [address]. Pt receives a dose based on labs during visit.    Marland Kitchen traMADol (ULTRAM) 50 MG tablet Take 1-2 tablets (50-100 mg total) by mouth every 6 (six) hours as needed for moderate pain or severe pain. 15 tablet 0   No current facility-administered medications for this encounter.    Vitals:   10/19/20 1112  BP: (!) 150/90  Pulse: 85  SpO2: 100%  Weight: 55.3 kg (122 lb)    PHYSICAL EXAM: General:  Well appearing. No resp difficulty HEENT: normal  Neck: supple. no JVD. Carotids 2+ bilat; no bruits. No lymphadenopathy or thryomegaly appreciated. + well-healed scar Cor: PMI nondisplaced. Regular rate & rhythm. No rubs, gallops or murmurs. Lungs: clear Abdomen: soft, nontender, nondistended. No hepatosplenomegaly. No bruits or masses. Good bowel sounds. Extremities: no cyanosis, clubbing, rash, edema + LUE AVF Neuro: alert & orientedx3, cranial nerves grossly intact. moves all 4 extremities w/o difficulty. Affect pleasant   ASSESSMENT & PLAN   1. Pericardial Effusion: large posterior effusion noted on echo 1/21, previously moderate on prior echo 12/20  - s/p pericardial window on 1/29. 800cc fluid.  Pericardial biopsy sent for amyloid (negative) . Cultures negative. Cytology negative -minimal effusion on echo 5/21 - no effusion on echo today   2. Chronic systolic HF: new diagnosed made 08/2019 while  admitted for covid 19 infection. EF 25-30%. Hs troponin level at time of initial diganosis was mildly elevated but flat trend, 72>>79. ? Viral Myocarditis vs HTN. Unable to get cMRi due to renal function.  - Repeat Echo 1/21 EF 30-35%, global hypokinesis, mod LVH, RV normal size and hyperdynamic systolic function - Echo today EF ~55% Trivial MR/TR. No effusion. Personally reviewed - Cath no CAD - NYHA I-II - on carvedilol 25 bid and Entresto 24/26 bid  - Failed bidil due to severe HA - BP remains elevated. No issues with hyperkalemia.  Increase Entresto to 97/103   3. ESRD: chronic renal failure since age 29 secondary to Alport Syndrome. 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 She is on HD MWF and followed by Dr. Justin Mend.  - management per nephrology   4. HTN - BP remains elevated.  - No issues with hyperkalemia.  Will increase Entresto   5. Neck swelling - s/p parathyroidectomy    Glori Bickers, MD  10:30 AM

## 2020-10-19 NOTE — Patient Instructions (Signed)
INCREASE Entresto 97/103mg  (1 tablet) twice daily  Please call our office in July 2022 to schedule your follow up appointment  If you have any questions or concerns before your next appointment please send [place] a message through [place] or call our office at [phone].    TO LEAVE A MESSAGE FOR THE NURSE SELECT OPTION 2, PLEASE LEAVE A MESSAGE INCLUDING: . YOUR NAME . DATE OF BIRTH . CALL BACK NUMBER . REASON FOR CALL**this is important as we prioritize the call backs  YOU WILL RECEIVE A CALL BACK THE SAME DAY AS LONG AS YOU CALL BEFORE 4:00 PM

## 2020-11-14 ENCOUNTER — Ambulatory Visit: Payer: Medicaid Other | Admitting: Family

## 2020-12-07 ENCOUNTER — Ambulatory Visit: Payer: Medicare Other

## 2020-12-10 IMAGING — US US FNA BIOPSY THYROID 1ST LESION
1 series · 13 of 25 positions shown · non-contrast
Comparison: Ultrasound thyroid from February 17, 2020

INDICATION: Patient history of ultrasound of the thyroid from February 17, 2020
which revealed a 4.2 cm nodule in the right inferior lobe. Patient
presents for ultrasound-guided thyroid biopsy

EXAM:
ULTRASOUND GUIDED FINE NEEDLE ASPIRATION OF INDETERMINATE THYROID
NODULE
TECHNIQUE: Informed written consent was obtained from the patient after a
discussion of the risks, benefits and alternatives to treatment.
Questions regarding the procedure were encouraged and answered. A
timeout was performed prior to the initiation of the procedure.

[Series 1: us fna biopsy thyroid 1st lesion · 0.06mm/px · 25 acquisitions, 13 frames shown]
[im 1/25]
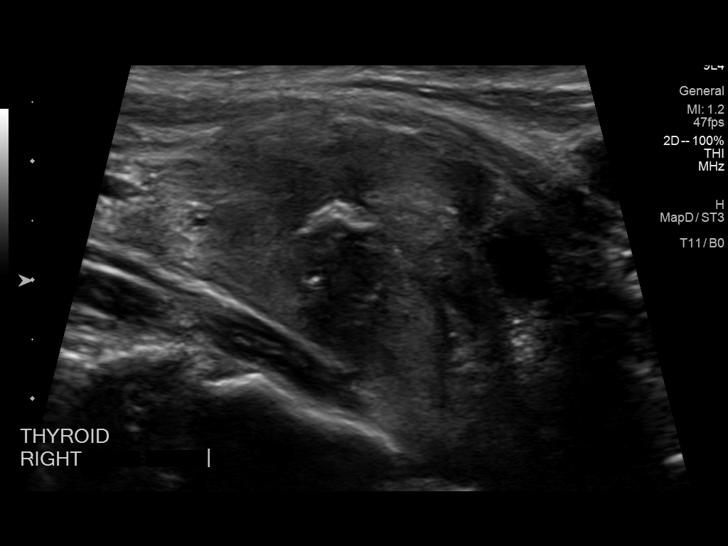
[im 3/25]
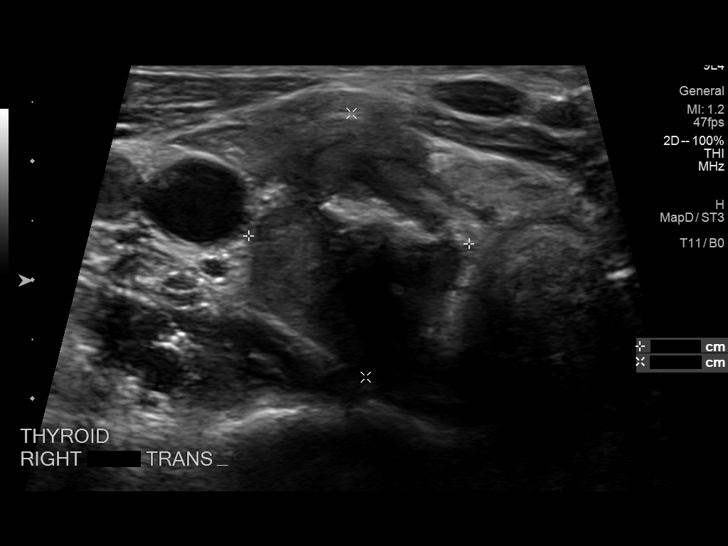
[im 5/25]
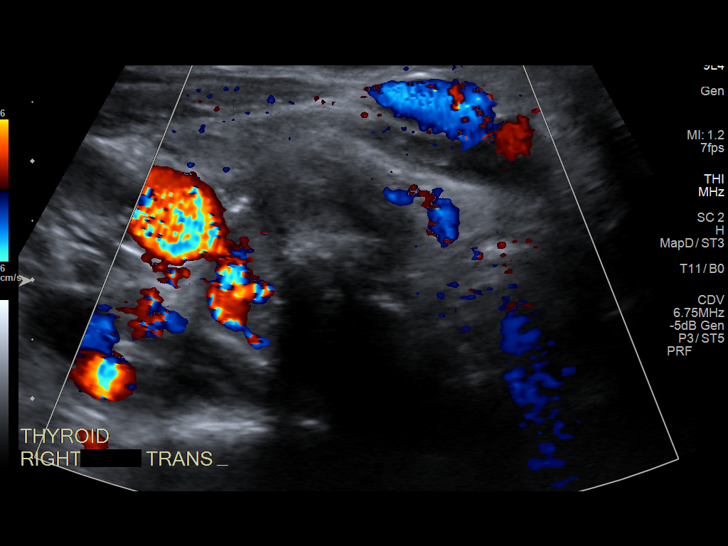
[im 7/25]
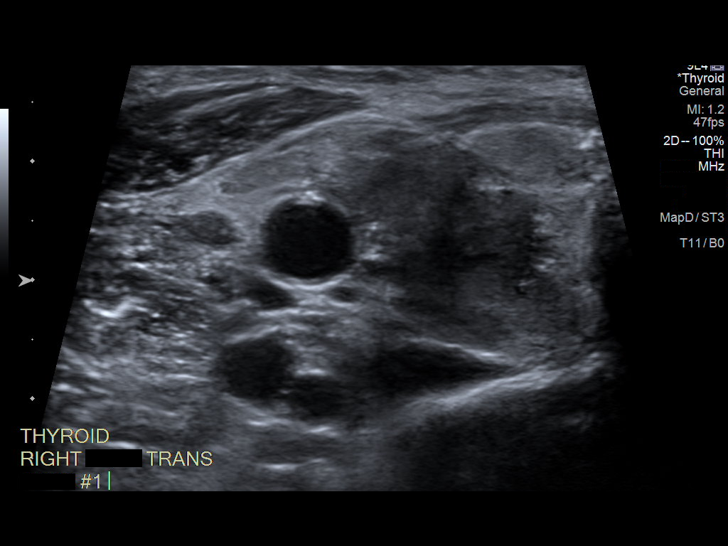
[im 9/25]
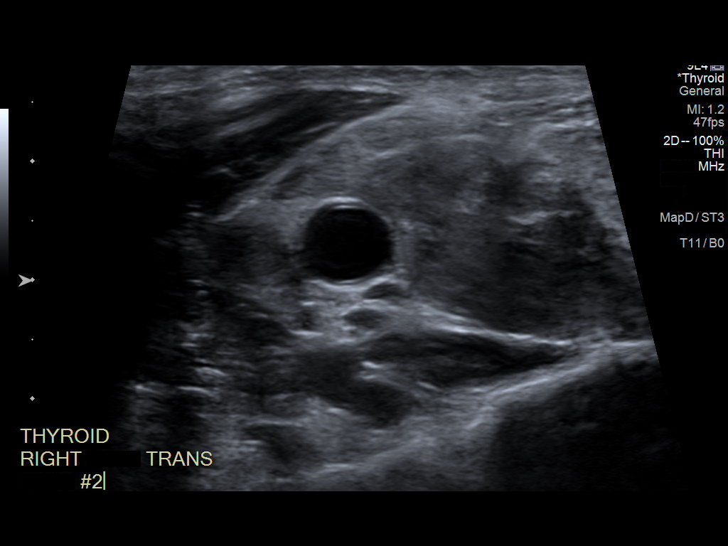
[im 11/25]
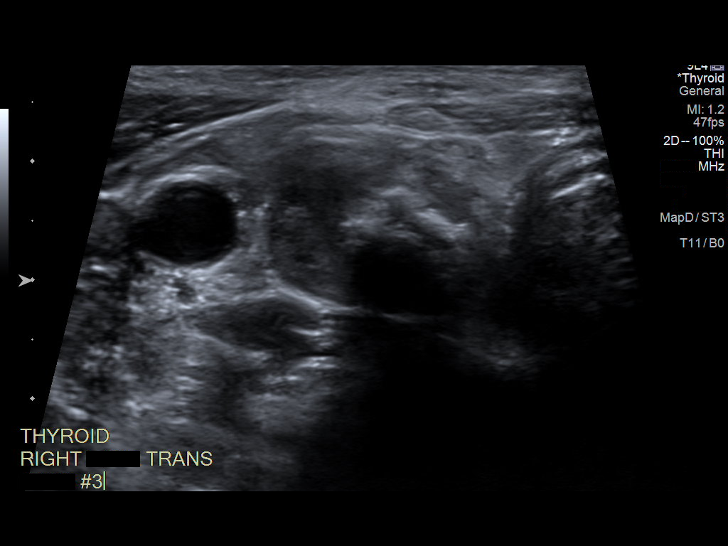
[im 13/25]
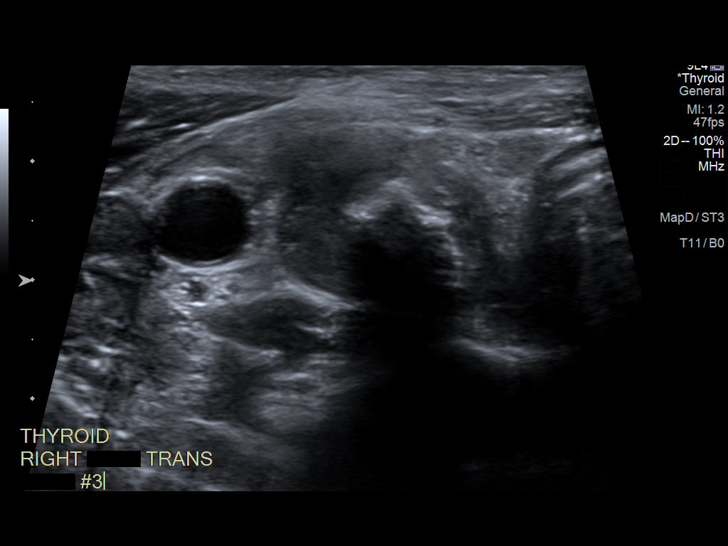
[im 15/25]
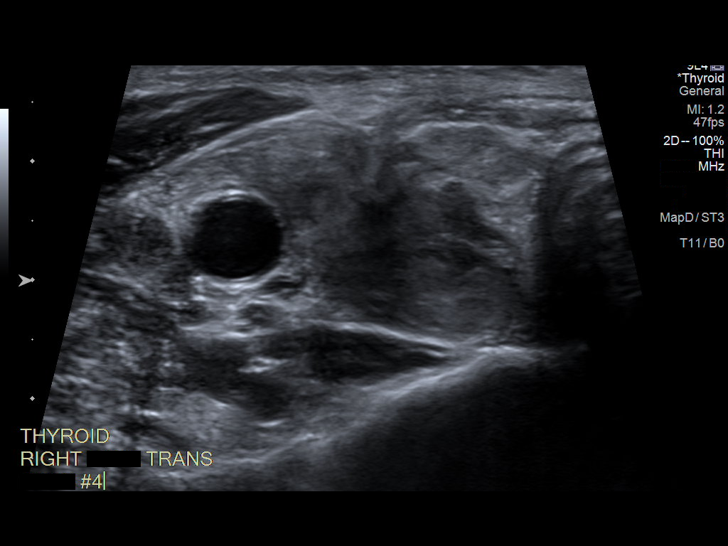
[im 17/25]
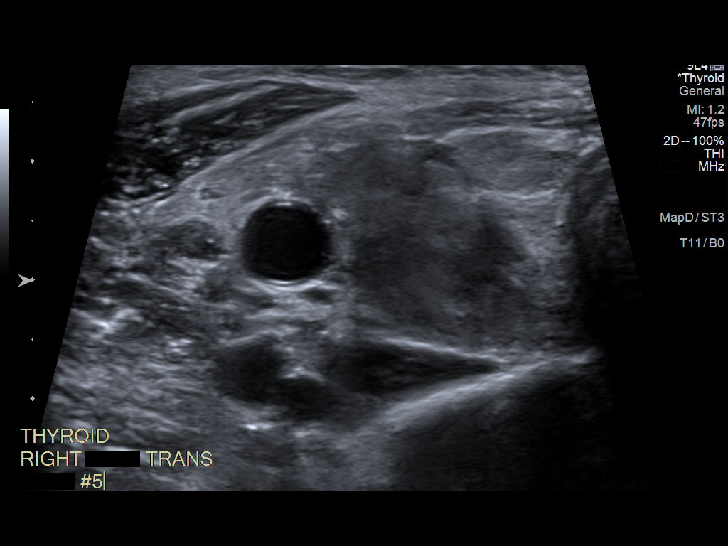
[im 19/25]
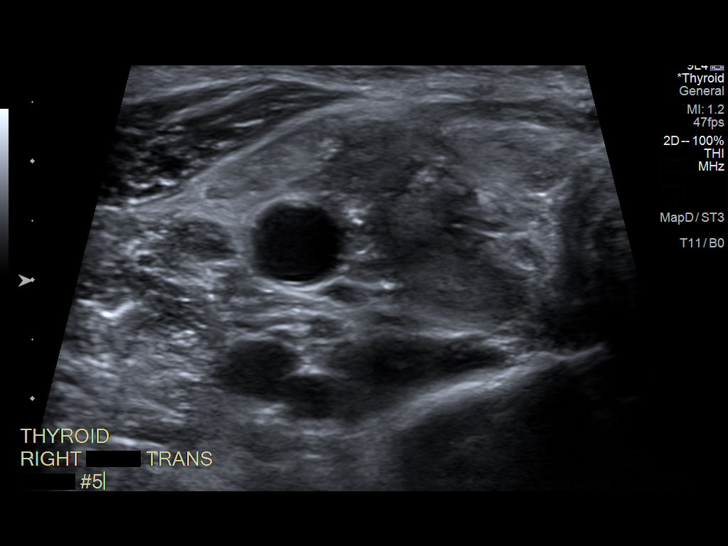
[im 21/25]
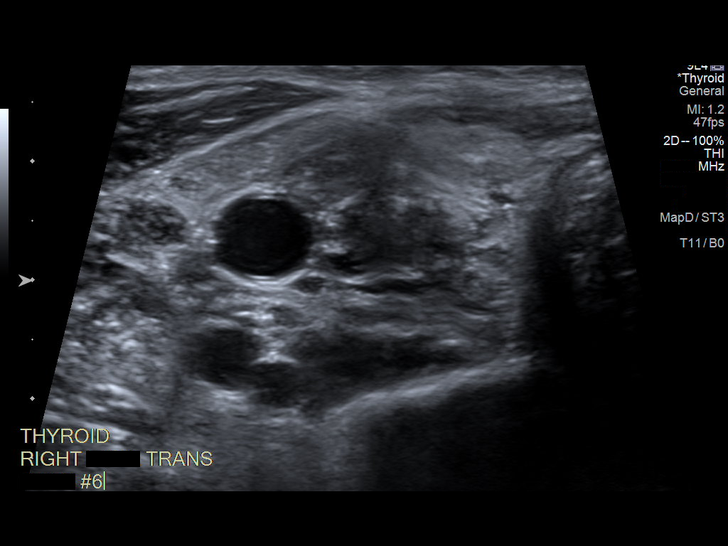
[im 23/25]
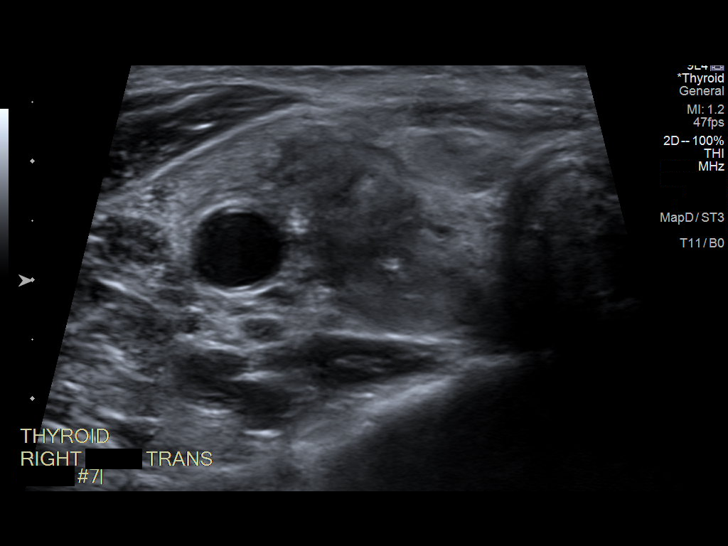
[im 25/25]
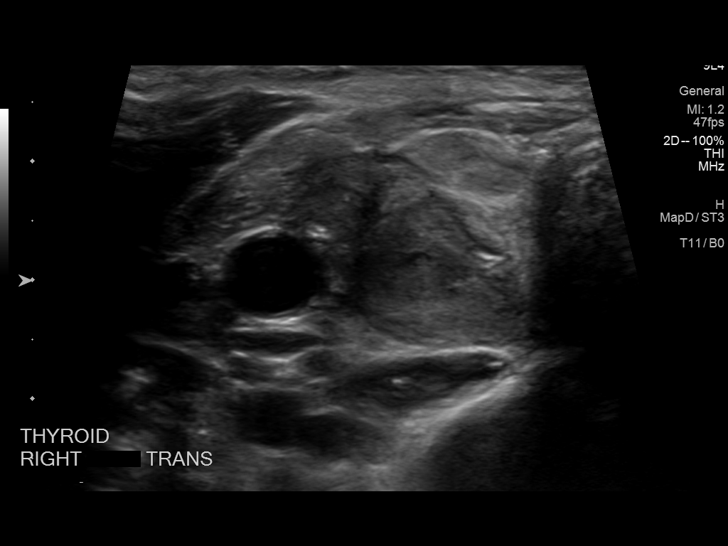

[13 of 25 positions shown; findings below may reference images not displayed]

MEDICATIONS:
Lidocaine 1% from skin to subcutaneous tissue.

COMPLICATIONS:
None immediate.
Pre-procedural ultrasound scanning demonstrated unchanged size and
appearance of the indeterminate nodule within the right inferior
lobe

The procedure was planned. The neck was prepped in the usual sterile
fashion, and a sterile drape was applied covering the operative
field. A timeout was performed prior to the initiation of the
procedure. Local anesthesia was provided with 1% lidocaine.

Under direct ultrasound guidance, 6 FNA biopsies were performed of
the right inferior nodule with a 25 gauge needle.

Two samples were sent to [facility] per ordering [facility].

Multiple ultrasound images were saved for procedural documentation
purposes. The samples were prepared and submitted to pathology.

Limited post procedural scanning was negative for hematoma or
additional complication. Dressings were placed. The patient
tolerated the above procedures procedure well without immediate
postprocedural complication.
FINDINGS: Nodule reference number based on prior diagnostic ultrasound: 2

Maximum size: 4.2 cm

Location: Right; Inferior

ACR TI-RADS risk category: TR4 (4-6 points)

Reason for biopsy: meets ACR TI-RADS criteria

Ultrasound imaging confirms appropriate placement of the needles
within the thyroid nodule.
IMPRESSION: Technically successful ultrasound guided fine needle aspiration of
right inferior thyroid nodule

Read by: [place] Taruberekera, NP

## 2020-12-28 ENCOUNTER — Encounter: Payer: Self-pay | Admitting: *Deleted

## 2021-02-11 IMAGING — CR DG CHEST 2V
2 series · 2 of 2 positions shown · non-contrast
Comparison: 10/21/2019 chest radiograph and prior.

CLINICAL DATA: Cough

EXAM:
CHEST - 2 VIEW

[w chest pa]
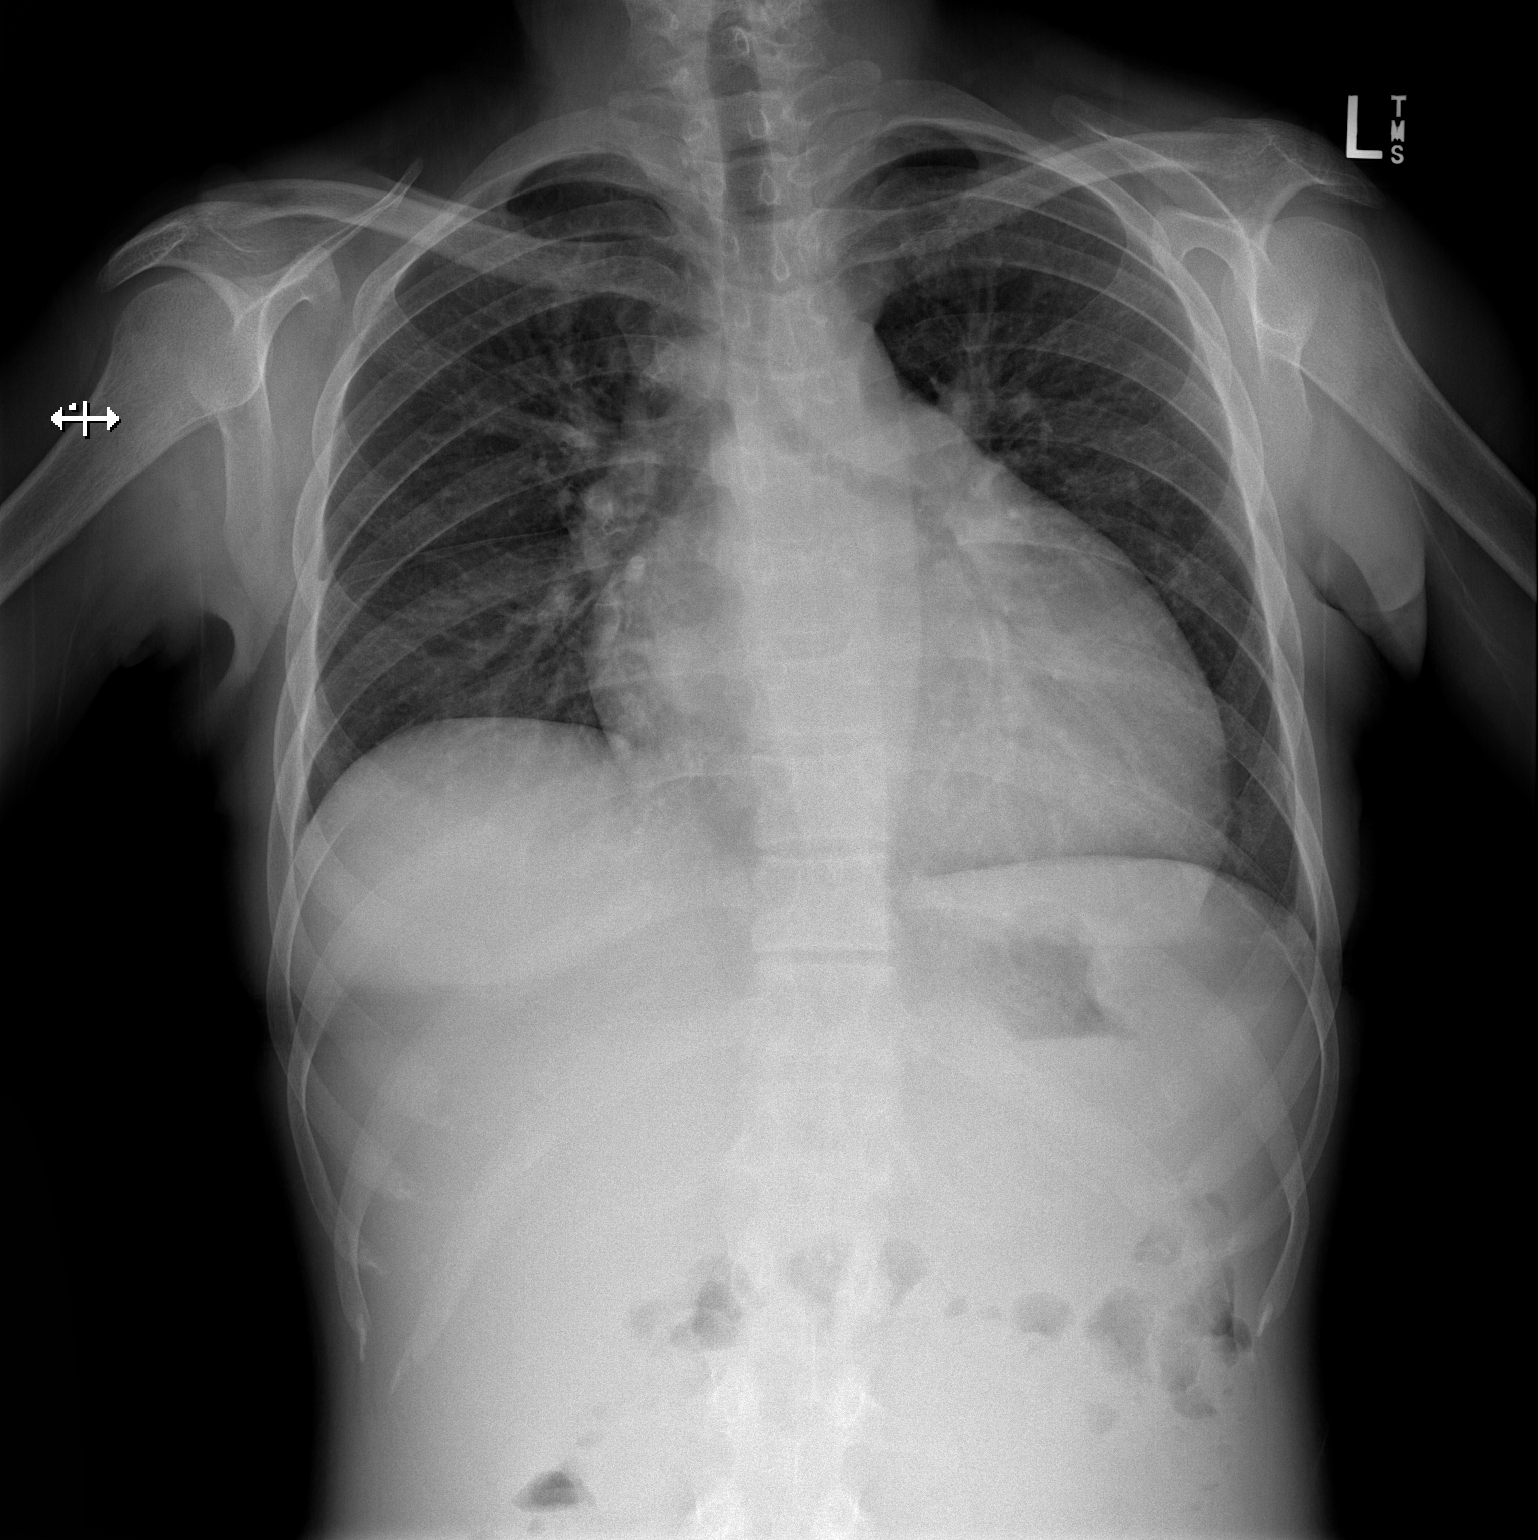

[w chest lat]
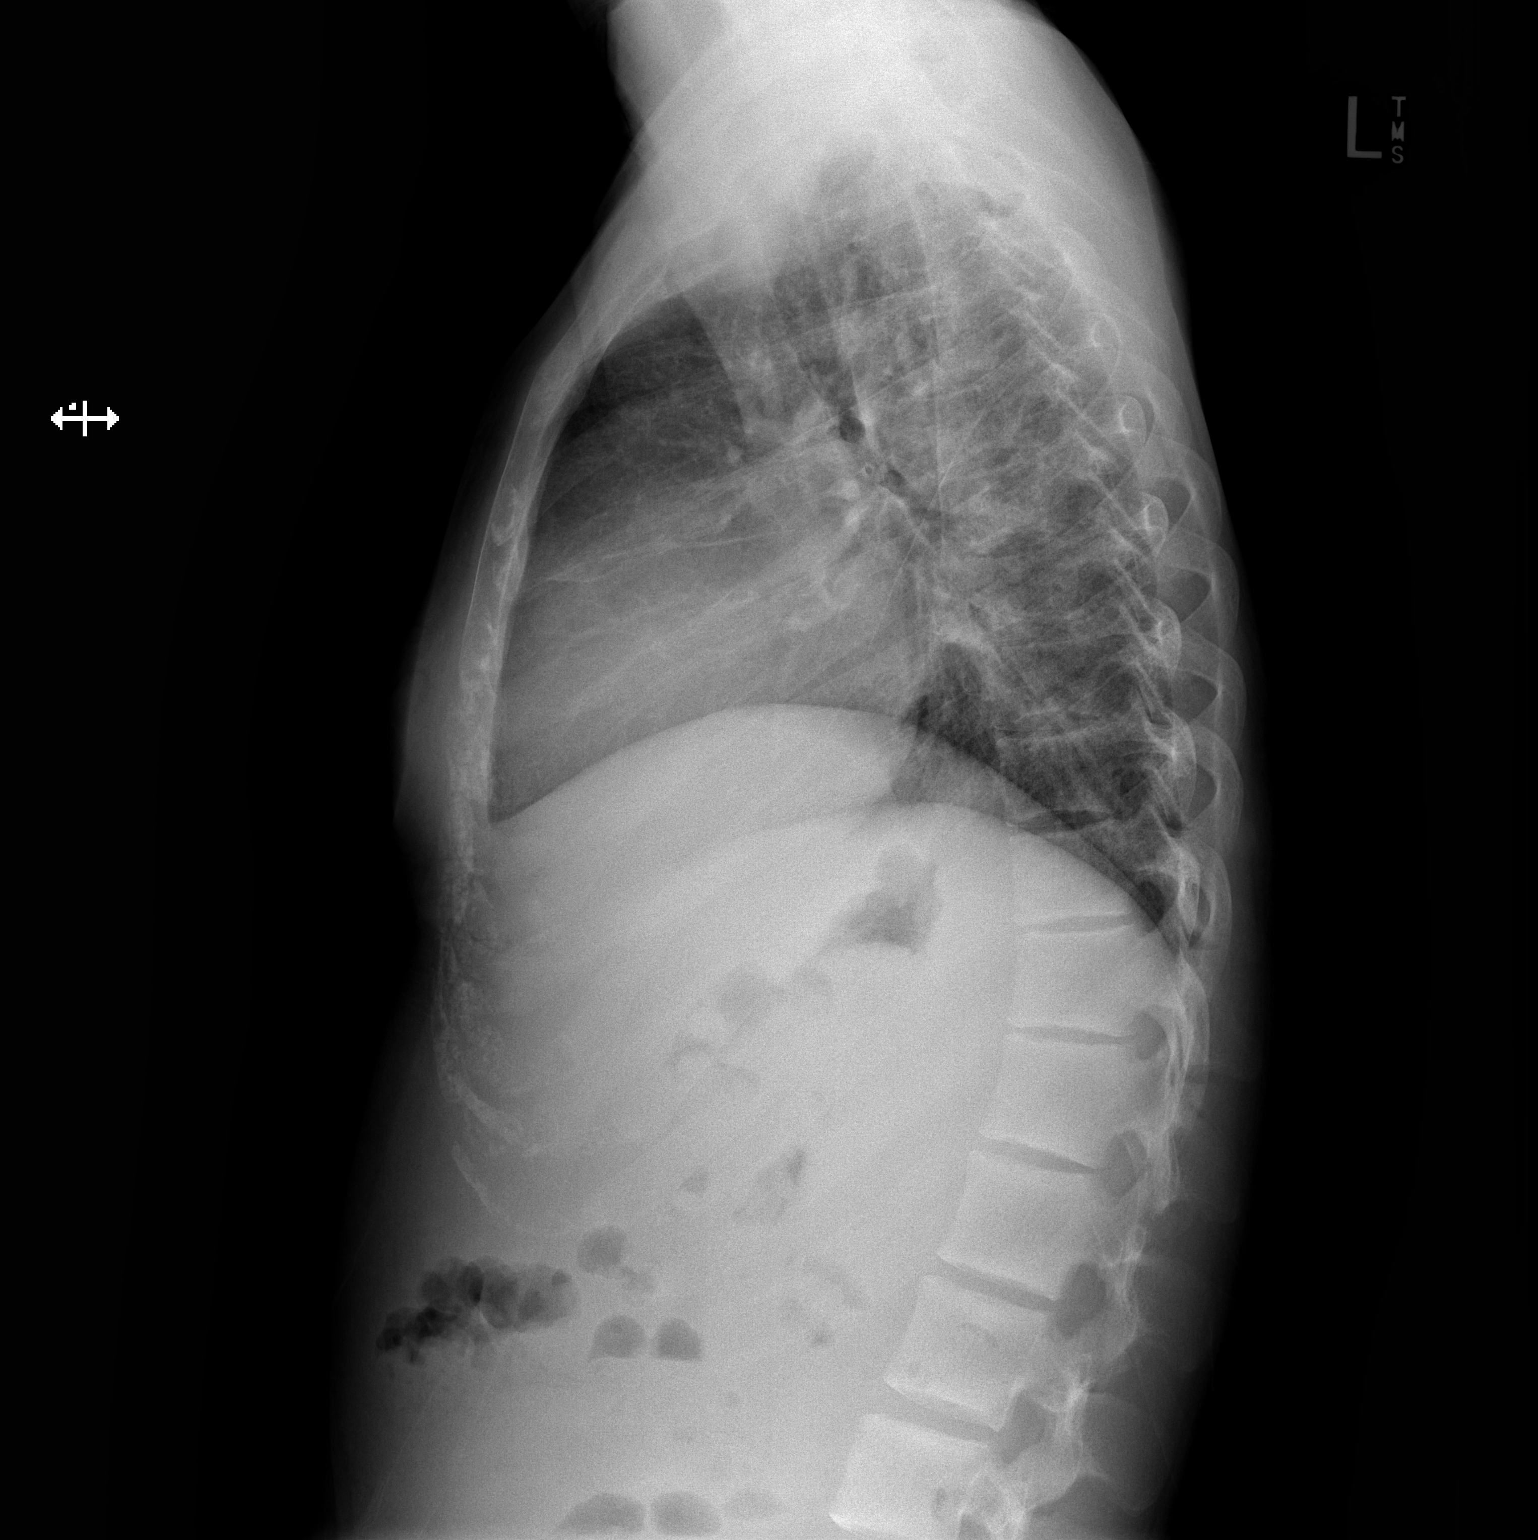

[2 of 2 positions shown; findings below may reference images not displayed]

FINDINGS: Interval removal of right IJ CVC. Cardiomegaly. Hypoinflated lungs.
No focal consolidation. Mild interstitial and central pulmonary
vascular prominence.
IMPRESSION: No focal consolidation.

Cardiomegaly. Mild central pulmonary/interstitial prominence may
reflect edema.

## 2021-02-27 ENCOUNTER — Other Ambulatory Visit: Payer: Self-pay

## 2021-02-27 ENCOUNTER — Ambulatory Visit (INDEPENDENT_AMBULATORY_CARE_PROVIDER_SITE_OTHER): Payer: Medicare Other | Admitting: Obstetrics & Gynecology

## 2021-02-27 ENCOUNTER — Encounter: Payer: Self-pay | Admitting: Obstetrics & Gynecology

## 2021-02-27 VITALS — BP 133/86 | HR 97 | Wt 134.9 lb

## 2021-02-27 DIAGNOSIS — Z975 Presence of (intrauterine) contraceptive device: Secondary | ICD-10-CM | POA: Diagnosis not present

## 2021-02-27 MED ORDER — ETONOGESTREL 68 MG ~~LOC~~ IMPL
68.0000 mg | DRUG_IMPLANT | Freq: Once | SUBCUTANEOUS | Status: AC
  Administered 2021-02-27: 68 mg via SUBCUTANEOUS

## 2021-02-27 NOTE — Progress Notes (Signed)
GYNECOLOGY OFFICE PROCEDURE NOTE  Dominique Lawrence [name] is a 20 y.o. No obstetric history o andn file. here for Nexplanon removal and Nexplanon insertion.  Last pap smear was on 10/03/2020 and was normal.  No other gynecologic concerns.         Nexplanon Removal and Insertion  Patient identified, informed consent performed, consent signed.   Patient does understand that irregular bleeding is a very common side effect of this medication. She was advised to have backup contraception for one week after replacement of the implant. Pregnancy test in clinic today was negative.  Appropriate time out taken. Implanon site identified. Area prepped in usual sterile fashon. One ml of 1% lidocaine was used to anesthetize the area at the distal end of the implant. A small stab incision was made right beside the implant on the distal portion. The Nexplanon rod was grasped using hemostats and removed without difficulty. There was minimal blood loss. There were no complications. Nexplanon removed from packaging, Device confirmed in needle, then inserted full length of needle and withdrawn per handbook instructions. Nexplanon was able to palpated in the patient's arm; patient was offer to palpate the insert herself, but she confirmed she could see the device beneath the skin.  There was minimal blood loss. Patient insertion site covered with gauze and a pressure bandage to reduce any bruising. The patient tolerated the procedure well and was given post procedure instructions.    Woodroe Mode, MD Attending Obstetrician & Gynecologist, [place] for [place]  02/27/2021

## 2021-09-16 ENCOUNTER — Other Ambulatory Visit: Payer: Self-pay | Admitting: Cardiovascular Disease

## 2021-10-03 ENCOUNTER — Other Ambulatory Visit: Payer: Self-pay | Admitting: Cardiothoracic Surgery

## 2021-11-13 ENCOUNTER — Encounter: Payer: Self-pay | Admitting: Obstetrics & Gynecology

## 2021-11-13 ENCOUNTER — Other Ambulatory Visit: Payer: Self-pay

## 2021-11-13 ENCOUNTER — Ambulatory Visit (INDEPENDENT_AMBULATORY_CARE_PROVIDER_SITE_OTHER): Payer: Medicare Other | Admitting: Obstetrics & Gynecology

## 2021-11-13 VITALS — BP 176/105 | HR 103 | Ht 64.0 in | Wt 154.0 lb

## 2021-11-13 DIAGNOSIS — Z975 Presence of (intrauterine) contraceptive device: Secondary | ICD-10-CM | POA: Diagnosis not present

## 2021-11-13 DIAGNOSIS — N921 Excessive and frequent menstruation with irregular cycle: Secondary | ICD-10-CM | POA: Diagnosis not present

## 2021-11-13 MED ORDER — MEDROXYPROGESTERONE ACETATE 10 MG PO TABS
10.0000 mg | ORAL_TABLET | Freq: Every day | ORAL | 0 refills | Status: DC
Start: 2021-11-13 — End: 2021-12-27

## 2021-11-13 NOTE — Progress Notes (Signed)
Patient ID: Dominique Lawrence, female   DOB: 11-25-2000, 21 y.o.   MRN: [id number] ? ?Chief Complaint  ?Patient presents with  ? Vaginal Bleeding  ? AUB  ? ? ?HPI ?[facility] Dominique Lawrence is a 21 y.o. female.  No obstetric history on file. ?Patient's last menstrual period was 08/12/2021. ?She has had vaginal bleeding nearly continuously for 3 months with Nexplanon in place for 9 months. Her Hb was 7 recently ?HPI ? ?Past Medical History:  ?Diagnosis Date  ? Alport syndrome   ? Anemia   ? Chronic systolic CHF (congestive heart failure) ([place])   ? Constipation 12/18/2013  ? Depression 07/28/2013  ? ESRD (end stage renal disease) ([place])   ?  Transplant failed Hemo MWF at [facility].  ? Hearing loss   ? Bilateral hearing aids  ? History of blood transfusion   ? Hypertension   ? Influenza B   ? S/P wisdom tooth extraction 06/22/2016  ? Right mandibular dental pain  ? Sinus tachycardia   ? Wears glasses   ? ? ?Past Surgical History:  ?Procedure Laterality Date  ? AV FISTULA PLACEMENT Left 02/10/2020  ? Procedure: ARTERIOVENOUS (AV) FISTULA CREATION;  Surgeon: Serafina Mitchell, MD;  Location: [facility] OR;  Service: Vascular;  Laterality: Left;  ? CENTRAL VENOUS CATHETER INSERTION  10/08/2019  ? Procedure: Insertion Central Line Adult;  Surgeon: Prescott Gum, Collier [place]ina, MD;  Location: [facility];  Service: Thoracic;;  ? Hemodialysis catheter placed Right   ? IR REMOVAL TUN CV CATH W/O FL  06/15/2020  ? 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 NEPHRECTOMY Right   ? PARATHYROIDECTOMY  07/17/2020  ? TOTAL PARATHYROIDECTOMY WITH AUTOTRANSPLANT (N/A  ? PARATHYROIDECTOMY Left 07/17/2020  ? Procedure: TOTAL PARATHYROIDECTOMY WITH AUTOTRANSPLANT;  Surgeon: Armandina Gemma, MD;  Location: [facility];  Service: General;  Laterality: Left;  Left arm is location of autoransplant  ? RIGHT/LEFT HEART CATH AND CORONARY ANGIOGRAPHY N/A 10/07/2019  ? Procedure: RIGHT/LEFT HEART CATH AND CORONARY ANGIOGRAPHY;  Surgeon: Jolaine Artist,  MD;  Location: [facility] CV LAB;  Service: Cardiovascular;  Laterality: N/A;  ? SUBXYPHOID PERICARDIAL WINDOW N/A 10/08/2019  ? Procedure: SUBXYPHOID PERICARDIAL WINDOW;  Surgeon: Ivin Poot, MD;  Location: [facility];  Service: Thoracic;  Laterality: N/A;  ? TEE WITHOUT CARDIOVERSION N/A 10/08/2019  ? Procedure: TRANSESOPHAGEAL ECHOCARDIOGRAM (TEE);  Surgeon: Prescott Gum, Collier Salina, MD;  Location: [facility];  Service: Thoracic;  Laterality: N/A;  ? WISDOM TOOTH EXTRACTION    ? ? ?Family History  ?Problem Relation Age of Onset  ? Alport syndrome Brother   ? ? ?Social History ?Social History  ? ?Tobacco Use  ? Smoking status: Never  ? Smokeless tobacco: Never  ?Vaping Use  ? Vaping Use: Never used  ?Substance Use Topics  ? Alcohol use: No  ? Drug use: No  ? ? ?Allergies  ?Allergen Reactions  ? Enalapril Other (See Comments)  ?  AKI  ? Chlorhexidine Gluconate Hives  ? Ibuprofen Other (See Comments)  ?  Pt not able to take ibuprofen due to Alport's syndrome  ? ? ?Current Outpatient Medications  ?Medication Sig Dispense Refill  ? acetaminophen (TYLENOL) 500 MG tablet Take 500 mg by mouth every 6 (six) hours as needed for moderate pain or headache.    ? amLODipine (NORVASC) 10 MG tablet Take 1 tablet (10 mg total) by mouth daily. Please make overdue appt with Dr. Johnsie Cancel before anymore refills. Thank you 2nd attempt 15 tablet  0  ? AURYXIA 1 GM 210 MG(Fe) tablet Take 420 mg by mouth 3 (three) times daily with meals.     ? carvedilol (COREG) 25 MG tablet TAKE 1 TABLET(25 MG) BY MOUTH TWICE DAILY WITH A MEAL 180 tablet 2  ? hydrALAZINE (APRESOLINE) 100 MG tablet Take 100 mg by mouth 3 (three) times daily. (Patient not taking: Reported on 02/27/2021)    ? isosorbide mononitrate (IMDUR) 30 MG 24 hr tablet Take 1 tablet (30 mg total) by mouth daily. 90 tablet 3  ? medroxyPROGESTERone (PROVERA) 10 MG tablet Take 1 tablet (10 mg total) by mouth daily. 30 tablet 0  ? sacubitril-valsartan (ENTRESTO) 97-103 MG Take 1 tablet by mouth 2 (two)  times daily. 60 tablet 3  ? traMADol (ULTRAM) 50 MG tablet Take 1-2 tablets (50-100 mg total) by mouth every 6 (six) hours as needed for moderate pain or severe pain. 15 tablet 0  ? ?No current facility-administered medications for this visit.  ? ? ?Review of Systems ?Review of Systems  ?Constitutional: Negative.   ?Genitourinary:  Positive for menstrual problem and vaginal bleeding (light today).  ? ?Blood pressure (!) 176/105, pulse (!) 103, height '5\' 4"'$  (1.626 m), weight 154 lb (69.9 kg), last menstrual period 08/12/2021. ? ?Physical Exam ?Physical Exam ?Constitutional:   ?   Appearance: Normal appearance.  ?Pulmonary:  ?   Effort: Pulmonary effort is normal.  ?Neurological:  ?   General: No focal deficit present.  ?   Mental Status: She is alert.  ?Psychiatric:     ?   Mood and Affect: Mood normal.     ?   Behavior: Behavior normal.  ? ? ?Data Reviewed ? ? ?Assessment ?Breakthrough bleeding on Nexplanon - Plan: medroxyPROGESTERone (PROVERA) 10 MG tablet ? ? ?Plan ?Will treat with one month of supplemental Provera and she can have routine f/u if the bleeding resolves ? ? ? ?Emeterio Reeve ?11/13/2021, 11:53 AM ? ? ? ?

## 2021-11-13 NOTE — Progress Notes (Addendum)
21  y.o GYN presents for AUB, bleeding since 08/12/2021.  She had the Nexplanon inserted 02/27/21.  Patient gets Dialysis 3 x weekly.  Pt is concerned about her HGB levels because of the bleeding. ? ?Last PAP 10/03/2020 ?

## 2021-12-21 ENCOUNTER — Ambulatory Visit: Payer: Medicare Other | Admitting: Family Medicine

## 2021-12-27 ENCOUNTER — Encounter (HOSPITAL_COMMUNITY): Payer: Self-pay

## 2021-12-27 ENCOUNTER — Encounter: Payer: Self-pay | Admitting: Obstetrics & Gynecology

## 2021-12-27 ENCOUNTER — Ambulatory Visit (HOSPITAL_COMMUNITY)
Admission: EM | Admit: 2021-12-27 | Discharge: 2021-12-27 | Disposition: A | Discharge status: Home / Self Care | Payer: Medicare Other | Attending: Family Medicine | Admitting: Family Medicine

## 2021-12-27 DIAGNOSIS — N939 Abnormal uterine and vaginal bleeding, unspecified: Secondary | ICD-10-CM | POA: Insufficient documentation

## 2021-12-27 LAB — HCG, SERUM, QUALITATIVE: Preg, Serum: NEGATIVE

## 2021-12-27 NOTE — ED Provider Notes (Signed)
?[place] ? ? ? ?CSN: [id number] ?Arrival date & time: 12/27/21  1247 ? ? ?  ? ?History   ?Chief Complaint ?Chief Complaint  ?Patient presents with  ? Abdominal Pain  ? ? ?HPI ?Dominique Lawrence is a 21 y.o. female.  ? ? ?Abdominal Pain ?Here for persistent vaginal bleeding.  He was prescribed Provera in early March by GYN.  She has had a Nexplanon for approximately 10 months now.  The menstrual cycle lightened up some on the Provera, but then she noted some bleeding gums and some swelling of her gums so she stopped.  Now she is bleeding heavier with some pressure in her pelvis. ? ?She is on dialysis and does not urinate at all.  She is sexually active. ? ?She had blood drawn at dialysis yesterday to repeat her hemoglobin. ? ?Past Medical History:  ?Diagnosis Date  ? Alport syndrome   ? Anemia   ? Chronic systolic CHF (congestive heart failure) ([place])   ? Constipation 12/18/2013  ? Depression 07/28/2013  ? ESRD (end stage renal disease) ([place])   ?  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  ? Hearing loss   ? Bilateral hearing aids  ? History of blood transfusion   ? Hypertension   ? Influenza B   ? S/P wisdom tooth extraction 06/22/2016  ? Right mandibular dental pain  ? Sinus tachycardia   ? Wears glasses   ? ? ?Patient Active Problem List  ? Diagnosis Date Noted  ? Chest pain 07/21/2020  ? Prolonged QT interval 07/21/2020  ? Chronic systolic CHF (congestive heart failure) ([place]) 07/21/2020  ? Hypocalcemia 07/21/2020  ? H/O parathyroidectomy ([place]) 07/21/2020  ? Hyperkalemia 07/21/2020  ? Ascites 07/21/2020  ? Right thyroid nodule 07/15/2020  ? 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 Pericardial effusion 10/07/2019  ? Secondary hyperparathyroidism of renal origin ([place]) 09/15/2019  ? Dyspnea 08/23/2019  ? COVID-19 virus infection 08/23/2019  ? Tachycardia 08/23/2019  ? Elevated troponin 08/23/2019  ? Hypertension 08/23/2019  ? ESRD (end stage renal disease) on  dialysis ([place]) 08/23/2019  ? Severe anemia 02/26/2019  ? Nexplanon in place 11/23/2017  ? Avitaminosis D 12/23/2015  ? Acne vulgaris 09/25/2015  ? Kidney cysts 09/13/2015  ? Renal insufficiency 09/12/2015  ? Constipation 07/07/2015  ? Hearing loss 03/01/2015  ? Failed vision screen 09/14/2014  ? Chronic kidney disease (CKD) stage G1/A1, glomerular filtration rate (GFR) equal to or greater than 90 mL/min/1.73 square meter and albuminuria creatinine ratio less than 30 mg/g 11/13/2013  ? 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  ? Anemia in chronic kidney disease 07/28/2013  ? Overweight, pediatric, BMI 85.0-94.9 percentile for age 58/19/2014  ? ? ?Past Surgical History:  ?Procedure Laterality Date  ? AV FISTULA PLACEMENT Left 02/10/2020  ? Procedure: ARTERIOVENOUS (AV) FISTULA CREATION;  Surgeon: Serafina Mitchell, MD;  Location: [facility];  Service: Vascular;  Laterality: Left;  ? CENTRAL VENOUS CATHETER INSERTION  10/08/2019  ? Procedure: Insertion Central Line Adult;  Surgeon: Prescott Gum, Collier Salina, MD;  Location: [facility];  Service: Thoracic;;  ? Hemodialysis catheter placed Right   ? IR REMOVAL TUN CV CATH W/O FL  06/15/2020  ? 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 NEPHRECTOMY Right   ? PARATHYROIDECTOMY  07/17/2020  ? TOTAL PARATHYROIDECTOMY WITH AUTOTRANSPLANT (N/A  ? PARATHYROIDECTOMY Left 07/17/2020  ? Procedure: TOTAL PARATHYROIDECTOMY WITH AUTOTRANSPLANT;  Surgeon: Armandina Gemma, MD;  Location: [facility];  Service: General;  Laterality: Left;  Left arm is location of autoransplant  ? RIGHT/LEFT HEART CATH AND CORONARY ANGIOGRAPHY N/A 10/07/2019  ? Procedure: RIGHT/LEFT HEART CATH AND CORONARY ANGIOGRAPHY;  Surgeon: Jolaine Artist, MD;  Location: [place] CV [facility];  Service: Cardiovascular;  Laterality: N/A;  ? SUBXYPHOID PERICARDIAL WINDOW N/A 10/08/2019  ? Procedure: SUBXYPHOID PERICARDIAL WINDOW;  Surgeon: Ivin Poot, MD;  Location: [place];  Service: Thoracic;  Laterality: N/A;  ? TEE WITHOUT CARDIOVERSION N/A 10/08/2019  ? Procedure:  TRANSESOPHAGEAL ECHOCARDIOGRAM (TEE);  Surgeon: Prescott Gum, Collier Salina, MD;  Location: [place];  Service: Thoracic;  Laterality: N/A;  ? WISDOM TOOTH EXTRACTION    ? ? ?OB History   ?No obstetric history on file. ?  ? ? ? ?Home Medications   ? ?Prior to Admission medications   ?Medication Sig Start Date End Date Taking? Authorizing Provider  ?acetaminophen (TYLENOL) 500 MG tablet Take 500 mg by mouth every 6 (six) hours as needed for moderate pain or headache.    [provider]  ?amLODipine (NORVASC) 10 MG tablet Take 1 tablet (10 mg total) by mouth daily. Please make overdue appt with Dr. Johnsie Cancel before anymore refills. Thank you 2nd attempt 09/18/21   Josue Hector, MD  ?AURYXIA 1 GM 210 MG(Fe) tablet Take 420 mg by mouth 3 (three) times daily with meals.  03/13/20   [provider]  ?carvedilol (COREG) 25 MG tablet TAKE 1 TABLET(25 MG) BY MOUTH TWICE DAILY WITH A MEAL 01/26/20   Dahlia Byes, MD  ?hydrALAZINE (APRESOLINE) 100 MG tablet Take 100 mg by mouth 3 (three) times daily. ?Patient not taking: Reported on 02/27/2021 07/14/20   [provider]  ?isosorbide mononitrate (IMDUR) 30 MG 24 hr tablet Take 1 tablet (30 mg total) by mouth daily. 10/19/19 07/21/20  Clegg, Amy D, NP  ?sacubitril-valsartan (ENTRESTO) 97-103 MG Take 1 tablet by mouth 2 (two) times daily. 10/19/20   Bensimhon, Shaune Pascal, MD  ? ? ?Family History ?Family History  ?Problem Relation Age of Onset  ? Alport syndrome Brother   ? ? ?Social History ?Social History  ? ?Tobacco Use  ? Smoking status: Never  ? Smokeless tobacco: Never  ?Vaping Use  ? Vaping Use: Never used  ?Substance Use Topics  ? Alcohol use: No  ? Drug use: No  ? ? ? ?Allergies   ?Enalapril, Chlorhexidine gluconate, and Ibuprofen ? ? ?Review of Systems ?Review of Systems  ?Gastrointestinal:  Positive for abdominal pain.  ? ? ?Physical Exam ?Triage Vital Signs ?ED Triage Vitals  ?Enc Vitals Group  ?   BP 12/27/21 1326 (!) 188/108  ?   Pulse Rate 12/27/21 1326 93  ?    Resp 12/27/21 1326 18  ?   Temp 12/27/21 1326 98.7 ?F (37.1 ?C)  ?   Temp Source 12/27/21 1326 Oral  ?   SpO2 12/27/21 1326 99 %  ?   Weight --   ?   Height --   ?   Head Circumference --   ?   Peak Flow --   ?   Pain Score 12/27/21 1323 9  ?   Pain Loc --   ?   Pain Edu? --   ?   Excl. in [place]? --   ? ?No data found. ? ?Updated Vital Signs ?BP (!) 188/108 (BP Location: Right Arm)   Pulse 93   Temp 98.7 ?F (37.1 ?C) (Oral)   Resp 18   SpO2 99%  ? ?Visual Acuity ?Right Eye Distance:   ?  Left Eye Distance:   ?Bilateral Distance:   ? ?Right Eye Near:   ?Left Eye Near:    ?Bilateral Near:    ? ?Physical Exam ?Vitals reviewed.  ?Constitutional:   ?   General: She is not in acute distress. ?   Appearance: She is not ill-appearing, toxic-appearing or diaphoretic.  ?HENT:  ?   Mouth/Throat:  ?   Mouth: Mucous membranes are moist.  ?Eyes:  ?   Extraocular Movements: Extraocular movements intact.  ?Cardiovascular:  ?   Rate and Rhythm: Normal rate and regular rhythm.  ?   Heart sounds: No murmur heard. ?Pulmonary:  ?   Effort: Pulmonary effort is normal.  ?   Breath sounds: Normal breath sounds.  ?Abdominal:  ?   Palpations: Abdomen is soft.  ?   Tenderness: There is no abdominal tenderness.  ?Skin: ?   Capillary Refill: Capillary refill takes less than 2 seconds.  ?   Coloration: Skin is not jaundiced or pale.  ?Neurological:  ?   Mental Status: She is alert and oriented to person, place, and time.  ?Psychiatric:     ?   Behavior: Behavior normal.  ? ? ? ?UC Treatments / Results  ?Labs ?(all labs ordered are listed, but only abnormal results are displayed) ?Labs Reviewed  ?HCG, SERUM, QUALITATIVE  ? ? ?EKG ? ? ?Radiology ?No results found. ? ?Procedures ?Procedures (including critical care time) ? ?Medications Ordered in UC ?Medications - No data to display ? ?Initial Impression / Assessment and Plan / UC Course  ?I have reviewed the triage vital signs and the nursing notes. ? ?Pertinent labs & imaging results that were  available during my care of the patient were reviewed by me and considered in my medical decision making (see chart for details). ? ?  ? ?I am only going to do a serum pregnancy test to verify that that is

## 2021-12-27 NOTE — Discharge Instructions (Addendum)
We have drawn blood for a serum pregnancy test.  Staff will call you if it is positive ? ?Please send a MyChart message to your GYN to see if they can manage any of your symptoms before they see you ?

## 2021-12-27 NOTE — ED Triage Notes (Addendum)
Pt states her current birth control affects her cycle. Pt states once she took MedroxylProgesterone she noticed hair loss, swollen gums, and she stopped taken it. Pt then continued to use it again and now states abdominal pressure and heavy blood flow. Pt state she took her BP medication at 11:00am. Pt states she is on dialysis 3x a week- Mon/Wed/Fri ?

## 2022-01-03 ENCOUNTER — Ambulatory Visit (INDEPENDENT_AMBULATORY_CARE_PROVIDER_SITE_OTHER): Payer: Medicare Other | Admitting: Family Medicine

## 2022-01-03 ENCOUNTER — Encounter: Payer: Self-pay | Admitting: Family Medicine

## 2022-01-03 VITALS — BP 149/92 | HR 87 | Wt 151.2 lb

## 2022-01-03 DIAGNOSIS — Z3046 Encounter for surveillance of implantable subdermal contraceptive: Secondary | ICD-10-CM | POA: Diagnosis not present

## 2022-01-03 NOTE — Progress Notes (Signed)
? ? ? ?  GYNECOLOGY OFFICE PROCEDURE NOTE ? ?Dominique Lawrence is a 21 y.o. No obstetric history on file. here for Nexplanon removal due to on-going bleeding with Hgb dow to 7. Unresponsive to further progestins.  Last pap smear was on 10/03/2020 and was normal.  No other gynecologic concerns. ? ? ?Nexplanon Removal ?Patient identified, informed consent performed, consent signed.   Appropriate time out taken. Nexplanon site identified.  Area prepped in usual sterile fashon. One ml of 1% lidocaine was used to anesthetize the area at the distal end of the implant. A small stab incision was made right beside the implant on the distal portion.  The Nexplanon rod was grasped using hemostats and removed without difficulty.  There was minimal blood loss. There were no complications.  3 ml of 1% lidocaine was injected around the incision for post-procedure analgesia.  Steri-strips were applied over the small incision.  A pressure bandage was applied to reduce any bruising.  The patient tolerated the procedure well and was given post procedure instructions.  Patient is planning to use condoms for now  for contraception/attempt conception. ? ? ?Donnamae Jude, MD ?01/03/2022 ?1:22 PM ? ? ? ? ?

## 2022-01-03 NOTE — Progress Notes (Signed)
Patient in for Nexplanon removal. States that she has been bleeding since December. Nexplanon inserted June of 2022. Patient is on dialysis and states it has affected her dialysis and hemoglobin, would like to remove Nexplanon and does not plan on any BC at this time. Nexplanon in left arm, fistula for dialysis is also in left arm. ? ?Altha Harm, CMA  ?

## 2022-02-22 ENCOUNTER — Ambulatory Visit: Payer: Medicare Other | Admitting: Obstetrics & Gynecology

## 2022-06-25 ENCOUNTER — Telehealth: Payer: Medicare Other | Admitting: Family Medicine

## 2022-06-25 DIAGNOSIS — B9689 Other specified bacterial agents as the cause of diseases classified elsewhere: Secondary | ICD-10-CM

## 2022-06-25 DIAGNOSIS — J019 Acute sinusitis, unspecified: Secondary | ICD-10-CM | POA: Diagnosis not present

## 2022-06-25 MED ORDER — FLUTICASONE PROPIONATE 50 MCG/ACT NA SUSP
2.0000 | Freq: Every day | NASAL | 0 refills | Status: DC
Start: 2022-06-25 — End: 2022-10-10

## 2022-06-25 MED ORDER — AMOXICILLIN-POT CLAVULANATE 875-125 MG PO TABS: 1.0000 | ORAL_TABLET | Freq: Two times a day (BID) | ORAL | 0 refills | Status: AC

## 2022-06-25 NOTE — Patient Instructions (Signed)
Dominique Lawrence, thank you for joining Perlie Mayo, NP for today's virtual visit.  While this provider is not your primary care provider (PCP), if your PCP is located in our provider database this encounter information will be shared with them immediately following your visit.   [facility] account gives you access to today's visit and all your visits, tests, and labs performed at [facility] " click here if you don't have a [facility] account or go to [email or web address]  Consent: (Patient) Dominique Lawrence provided verbal consent for this virtual visit at the beginning of the encounter.  Current Medications:  Current Outpatient Medications:    amoxicillin-clavulanate (AUGMENTIN) 875-125 MG tablet, Take 1 tablet by mouth 2 (two) times daily for 7 days., Disp: 14 tablet, Rfl: 0   fluticasone (FLONASE) 50 MCG/ACT nasal spray, Place 2 sprays into both nostrils daily., Disp: 16 g, Rfl: 0   acetaminophen (TYLENOL) 500 MG tablet, Take 500 mg by mouth every 6 (six) hours as needed for moderate pain or headache., Disp: , Rfl:    amLODipine (NORVASC) 10 MG tablet, Take 1 tablet (10 mg total) by mouth daily. Please make overdue appt with Dr. Johnsie Cancel before anymore refills. Thank you 2nd attempt, Disp: 15 tablet, Rfl: 0   AURYXIA 1 GM 210 MG(Fe) tablet, Take 420 mg by mouth 3 (three) times daily with meals. , Disp: , Rfl:    carvedilol (COREG) 25 MG tablet, TAKE 1 TABLET(25 MG) BY MOUTH TWICE DAILY WITH A MEAL (Patient not taking: Reported on 01/03/2022), Disp: 180 tablet, Rfl: 2   hydrALAZINE (APRESOLINE) 100 MG tablet, Take 100 mg by mouth 3 (three) times daily. (Patient not taking: Reported on 02/27/2021), Disp: , Rfl:    isosorbide mononitrate (IMDUR) 30 MG 24 hr tablet, Take 1 tablet (30 mg total) by mouth daily., Disp: 90 tablet, Rfl: 3   sacubitril-valsartan (ENTRESTO) 97-103 MG, Take 1 tablet by mouth 2 (two) times daily., Disp: 60 tablet, Rfl: 3    Medications ordered in this encounter:  Meds ordered this encounter  Medications   amoxicillin-clavulanate (AUGMENTIN) 875-125 MG tablet    Sig: Take 1 tablet by mouth 2 (two) times daily for 7 days.    Dispense:  14 tablet    Refill:  0    Order Specific Question:   Supervising Provider    Answer:   Chase Picket [[id number]]   fluticasone (FLONASE) 50 MCG/ACT nasal spray    Sig: Place 2 sprays into both nostrils daily.    Dispense:  16 g    Refill:  0    Order Specific Question:   Supervising Provider    Answer:   Chase Picket [id number]     *If you need refills on other medications prior to your next appointment, please contact your pharmacy*  Follow-Up: Call back or seek an in-person evaluation if the symptoms worsen or if the condition fails to improve as anticipated.  [place] [phone]  Other Instructions  Delsym as needed for cough  Hydrate as you can safely  Rest   If you have been instructed to have an in-person evaluation today at a local Urgent Care facility, please use the link below. It will take you to a list of all of our available [facility], including address, phone number and hours of operation. Please do not delay care.  [facility]  If you or a family member do not have a primary  care provider, use the link below to schedule a visit and establish care. When you choose a [facility] primary care physician or advanced practice provider, you gain a long-term partner in health. Find a Primary Care Provider  Learn more about [facility]'s in-office and virtual care options: [facility]

## 2022-06-25 NOTE — Progress Notes (Signed)
Virtual Visit Consent   Dominique Lawrence, you are scheduled for a virtual visit with a [facility] provider today. Just as with appointments in the office, your consent must be obtained to participate. Your consent will be active for this visit and any virtual visit you may have with one of our providers in the next 365 days. If you have a MyChart account, a copy of this consent can be sent to you electronically.  As this is a virtual visit, video technology does not allow for your provider to perform a traditional examination. This may limit your provider's ability to fully assess your condition. If your provider identifies any concerns that need to be evaluated in person or the need to arrange testing (such as labs, EKG, etc.), we will make arrangements to do so. Although advances in technology are sophisticated, we cannot ensure that it will always work on either your end or our end. If the connection with a video visit is poor, the visit may have to be switched to a telephone visit. With either a video or telephone visit, we are not always able to ensure that we have a secure connection.  By engaging in this virtual visit, you consent to the provision of healthcare and authorize for your insurance to be billed (if applicable) for the services provided during this visit. Depending on your insurance coverage, you may receive a charge related to this service.  I need to obtain your verbal consent now. Are you willing to proceed with your visit today? Dominique Lawrence has provided verbal consent on 06/25/2022 for a virtual visit (video or telephone). Perlie Mayo, NP  Date: 06/25/2022 12:02 PM  Virtual Visit via Audio Note   I, Perlie Mayo, connected with  Dominique Lawrence  ([id number], 2001/01/29) on 06/25/22 at 12:00 PM EDT by a audio-enabled telemedicine application and verified that I am speaking with the correct person using two identifiers.  Location: Patient: Virtual Visit Location  Patient: Home Provider: Virtual Visit Location Provider: Home Office   I discussed the limitations of evaluation and management by telemedicine and the availability of in person appointments. The patient expressed understanding and agreed to proceed.    History of Present Illness: Dominique Lawrence is a 21 y.o. who identifies as a female who was assigned female at birth, and is being seen today for cough and cold symptoms. Last Wednesday started with a cough- Thursday COVID test was negative. Continue to feel poorly and Friday developed a fever of 101. Too tylenol and it broke fever. Continues to have cough, congestion/runny nose. Denies chest pain, shortness of breath, sore throat or ear pain.   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   Pericardial effusion 10/07/2019   Secondary hyperparathyroidism of renal origin ([place]) 09/15/2019   Dyspnea 08/23/2019   COVID-19 virus infection 08/23/2019   Tachycardia 08/23/2019   Elevated troponin 08/23/2019   Hypertension 08/23/2019   ESRD (end stage renal disease) on dialysis ([place]) 08/23/2019   Severe anemia 02/26/2019   Nexplanon in place 11/23/2017   Avitaminosis D 12/23/2015   Acne vulgaris 09/25/2015   Kidney cysts 09/13/2015   Renal insufficiency 09/12/2015  Constipation 07/07/2015   Hearing loss 03/01/2015   Failed vision screen 09/14/2014   Chronic kidney disease (CKD) stage G1/A1, glomerular filtration rate (GFR) equal to or greater than 90 mL/min/1.73 square meter and albuminuria creatinine ratio less than 30 mg/g 11/13/2013   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 Please make overdue appt with Dr. Johnsie Cancel before anymore refills. Thank you 2nd attempt, Disp: 15 tablet, Rfl: 0   AURYXIA 1 GM 210 MG(Fe) tablet, Take 420 mg by mouth 3 (three) times daily with meals. , Disp: , Rfl:    carvedilol (COREG) 25 MG tablet, TAKE 1 TABLET(25 MG) BY MOUTH TWICE DAILY WITH A MEAL (Patient not taking: Reported on 01/03/2022), Disp: 180 tablet, Rfl: 2   hydrALAZINE (APRESOLINE) 100 MG tablet, Take 100 mg by mouth 3 (three) times daily. (Patient not taking: Reported on 02/27/2021), Disp: , Rfl:    isosorbide mononitrate (IMDUR) 30 MG 24 hr tablet, Take 1 tablet (30 mg total) by mouth daily., Disp: 90 tablet, Rfl: 3   sacubitril-valsartan (ENTRESTO) 97-103 MG, Take 1 tablet by mouth 2 (two) times daily., Disp: 60 tablet, Rfl: 3  Observations/Objective:  No labored breathing.  Speech is clear and coherent with logical content.  Patient is alert and oriented at baseline.  Cough and Congestion noted  Assessment and Plan:  1. Acute bacterial sinusitis  - amoxicillin-clavulanate (AUGMENTIN) 875-125 MG tablet; Take 1 tablet by mouth 2 (two) times daily for 7 days.  Dispense: 14 tablet; Refill: 0 - fluticasone (FLONASE) 50 MCG/ACT nasal spray; Place 2 sprays into both nostrils daily.  Dispense: 16 g; Refill: 0  -rest -hydrate as you can -delsym for cough -take above meds to help with congestion    Reviewed side effects, risks and benefits of medication.   Patient acknowledged agreement and understanding of the plan.    Past Medical, Surgical, Social History, Allergies, and Medications have been Reviewed.   Follow Up Instructions: I discussed the assessment and treatment plan with the patient. The patient was provided an opportunity to ask questions and all were answered. The patient agreed with the plan and demonstrated an understanding of the instructions.  A copy of instructions were sent to the patient via MyChart unless otherwise noted below.     The patient was advised to call back or seek an in-person evaluation if the symptoms worsen or if the condition fails to improve as anticipated.  Time:  I spent 10 minutes with the patient via telehealth technology discussing the above problems/concerns.    Perlie Mayo, NP

## 2022-07-04 ENCOUNTER — Telehealth: Payer: Self-pay | Admitting: Oncology

## 2022-07-04 NOTE — Telephone Encounter (Signed)
Scheduled appt per 10/24 referral. Pt is aware of appt date and time. Pt is aware to arrive 15 mins prior to appt time and to bring and updated insurance card. Pt is aware of appt location.   

## 2022-07-16 ENCOUNTER — Inpatient Hospital Stay: Payer: Medicare Other | Attending: Oncology | Admitting: Oncology

## 2022-07-16 ENCOUNTER — Other Ambulatory Visit: Payer: Self-pay

## 2022-07-16 ENCOUNTER — Inpatient Hospital Stay: Payer: Medicare Other

## 2022-07-16 VITALS — BP 170/105 | HR 86 | Temp 98.1°F | Resp 16 | Wt 137.5 lb

## 2022-07-16 DIAGNOSIS — N186 End stage renal disease: Secondary | ICD-10-CM | POA: Diagnosis not present

## 2022-07-16 DIAGNOSIS — T8612 Kidney transplant failure: Secondary | ICD-10-CM | POA: Diagnosis not present

## 2022-07-16 DIAGNOSIS — R58 Hemorrhage, not elsewhere classified: Secondary | ICD-10-CM

## 2022-07-16 DIAGNOSIS — Z886 Allergy status to analgesic agent status: Secondary | ICD-10-CM | POA: Diagnosis not present

## 2022-07-16 DIAGNOSIS — Q8781 Alport syndrome: Secondary | ICD-10-CM | POA: Insufficient documentation

## 2022-07-16 DIAGNOSIS — Z992 Dependence on renal dialysis: Secondary | ICD-10-CM | POA: Diagnosis not present

## 2022-07-16 DIAGNOSIS — Z79899 Other long term (current) drug therapy: Secondary | ICD-10-CM | POA: Insufficient documentation

## 2022-07-16 DIAGNOSIS — N920 Excessive and frequent menstruation with regular cycle: Secondary | ICD-10-CM | POA: Insufficient documentation

## 2022-07-16 DIAGNOSIS — D696 Thrombocytopenia, unspecified: Secondary | ICD-10-CM | POA: Diagnosis present

## 2022-07-16 DIAGNOSIS — Z905 Acquired absence of kidney: Secondary | ICD-10-CM | POA: Insufficient documentation

## 2022-07-16 LAB — CBC WITH DIFFERENTIAL (CANCER CENTER ONLY)
Abs Immature Granulocytes: 0.01 10*3/uL (ref 0.00–0.07)
Basophils Absolute: 0 10*3/uL (ref 0.0–0.1)
Basophils Relative: 1 %
Eosinophils Absolute: 0.2 10*3/uL (ref 0.0–0.5)
Eosinophils Relative: 3 %
HCT: 30.4 % — ABNORMAL LOW (ref 36.0–46.0)
Hemoglobin: 9.9 g/dL — ABNORMAL LOW (ref 12.0–15.0)
Immature Granulocytes: 0 %
Lymphocytes Relative: 23 %
Lymphs Abs: 1.2 10*3/uL (ref 0.7–4.0)
MCH: 31.2 pg (ref 26.0–34.0)
MCHC: 32.6 g/dL (ref 30.0–36.0)
MCV: 95.9 fL (ref 80.0–100.0)
Monocytes Absolute: 0.3 10*3/uL (ref 0.1–1.0)
Monocytes Relative: 7 %
Neutro Abs: 3.4 10*3/uL (ref 1.7–7.7)
Neutrophils Relative %: 66 %
Platelet Count: 82 10*3/uL — ABNORMAL LOW (ref 150–400)
RBC: 3.17 MIL/uL — ABNORMAL LOW (ref 3.87–5.11)
RDW: 16.1 % — ABNORMAL HIGH (ref 11.5–15.5)
WBC Count: 5.1 10*3/uL (ref 4.0–10.5)
nRBC: 0 % (ref 0.0–0.2)

## 2022-07-16 LAB — PROTIME-INR
INR: 1.2 (ref 0.8–1.2)
Prothrombin Time: 14.8 seconds (ref 11.4–15.2)

## 2022-07-16 LAB — APTT: aPTT: 31 seconds (ref 24–36)

## 2022-07-16 NOTE — Progress Notes (Signed)
Reason for the request:    Thrombocytopenia  HPI: I was asked by Dr. Joylene Grapes to evaluate Dominique Lawrence for thrombocytopenia.  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  She was noted to have thrombocytopenia on a CBC in October 2023.  At that time her hemoglobin was 10.8 with white cell count of 3.78 and a platelet count of 98.  Her MCV is 93 with iron level of 81.  Previous counts showed fluctuating thrombocytopenia dating back to 2021.  At that time her platelet count was 105 and 109.  In December 2020 her platelet count was 132 and after each instance the platelet count recovered spontaneously.  In 2022 her platelet count was 119.  Clinically, she reports no active bleeding at this time but did report spontaneous ecchymosis on her arm predominantly on her right arm and forearm.  No evidence of bleeding on her fistula site.  She did have heavy menstrual bleeding but her cycles have not resumed after oral contraception has been removed.  She denies any bleeding complications after any surgeries in the past that includes her kidney transplant surgery in 2020 and parathyroid surgery most recently.  He does not have any family history of bleeding disorder.   She does not report any headaches, blurry vision, syncope or seizures. Does not report any fevers, chills or sweats.  Does not report any cough, wheezing or hemoptysis.  Does not report any chest pain, palpitation, orthopnea or leg edema.  Does not report any nausea, vomiting or abdominal pain.  Does not report any constipation or diarrhea.  Does not report any skeletal complaints.    Does not report frequency, urgency or hematuria.  Does not report any skin rashes or lesions. Does not report any heat or cold intolerance.  Does not report any lymphadenopathy or petechiae.  Does not report any anxiety or depression.  Remaining review of systems  is negative.     Past Medical History:  Diagnosis Date   Alport syndrome    Anemia    Chronic systolic CHF (congestive heart failure) (HCC)    Constipation 12/18/2013   Depression 07/28/2013   ESRD (end stage renal disease) ([place])     Transplant failed Hemo MWF at [facility].   Hearing loss    Bilateral hearing aids   History of blood transfusion    Hypertension    Influenza B    S/P wisdom tooth extraction 06/22/2016   Right mandibular dental pain   Sinus tachycardia    Wears glasses   :   Past Surgical History:  Procedure Laterality Date   AV FISTULA PLACEMENT Left 02/10/2020   Procedure: ARTERIOVENOUS (AV) FISTULA CREATION;  Surgeon: Serafina Mitchell, MD;  Location: [place];  Service: Vascular;  Laterality: Left;   CENTRAL VENOUS CATHETER INSERTION  10/08/2019   Procedure: Insertion Central Line Adult;  Surgeon: Prescott Gum, Collier Salina, MD;  Location: [place];  Service: Thoracic;;   Hemodialysis catheter placed Right    IR REMOVAL TUN CV CATH W/O FL  06/15/2020   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   NEPHRECTOMY Right    PARATHYROIDECTOMY  07/17/2020   TOTAL PARATHYROIDECTOMY WITH AUTOTRANSPLANT (N/A   PARATHYROIDECTOMY Left 07/17/2020   Procedure: TOTAL PARATHYROIDECTOMY WITH AUTOTRANSPLANT;  Surgeon: Armandina Gemma, MD;  Location: [place];  Service: General;  Laterality: Left;  Left arm is location of autoransplant   RIGHT/LEFT HEART CATH  AND CORONARY ANGIOGRAPHY N/A 10/07/2019   Procedure: RIGHT/LEFT HEART CATH AND CORONARY ANGIOGRAPHY;  Surgeon: Jolaine Artist, MD;  Location: [place] CV LAB;  Service: Cardiovascular;  Laterality: N/A;   SUBXYPHOID PERICARDIAL WINDOW N/A 10/08/2019   Procedure: SUBXYPHOID PERICARDIAL WINDOW;  Surgeon: Ivin Poot, MD;  Location: [place];  Service: Thoracic;  Laterality: N/A;   TEE WITHOUT CARDIOVERSION N/A 10/08/2019   Procedure: TRANSESOPHAGEAL ECHOCARDIOGRAM (TEE);  Surgeon: Prescott Gum, Collier Salina, MD;  Location: [facility] OR;  Service:  Thoracic;  Laterality: N/A;   WISDOM TOOTH EXTRACTION    :   Current Outpatient Medications:    acetaminophen (TYLENOL) 500 MG tablet, Take 500 mg by mouth every 6 (six) hours as needed for moderate pain or headache., Disp: , Rfl:    amLODipine (NORVASC) 10 MG tablet, Take 1 tablet (10 mg total) by mouth daily. Please make overdue appt with Dr. Johnsie Cancel before anymore refills. Thank you 2nd attempt, Disp: 15 tablet, Rfl: 0   AURYXIA 1 GM 210 MG(Fe) tablet, Take 420 mg by mouth 3 (three) times daily with meals. , Disp: , Rfl:    carvedilol (COREG) 25 MG tablet, TAKE 1 TABLET(25 MG) BY MOUTH TWICE DAILY WITH A MEAL (Patient not taking: Reported on 01/03/2022), Disp: 180 tablet, Rfl: 2   fluticasone (FLONASE) 50 MCG/ACT nasal spray, Place 2 sprays into both nostrils daily., Disp: 16 g, Rfl: 0   hydrALAZINE (APRESOLINE) 100 MG tablet, Take 100 mg by mouth 3 (three) times daily. (Patient not taking: Reported on 02/27/2021), Disp: , Rfl:    isosorbide mononitrate (IMDUR) 30 MG 24 hr tablet, Take 1 tablet (30 mg total) by mouth daily., Disp: 90 tablet, Rfl: 3   sacubitril-valsartan (ENTRESTO) 97-103 MG, Take 1 tablet by mouth 2 (two) times daily., Disp: 60 tablet, Rfl: 3:   Allergies  Allergen Reactions   Enalapril Other (See Comments)    AKI   Chlorhexidine Gluconate Hives   Ibuprofen Other (See Comments)    Pt not able to take ibuprofen due to Alport's syndrome  :   Family History  Problem Relation Age of Onset   Alport syndrome Brother   :   Social History   Socioeconomic History   Marital status: Married    Spouse name: Not on file   Number of children: Not on file   Years of education: Not on file   Highest education level: Not on file  Occupational History   Not on file  Tobacco Use   Smoking status: Never   Smokeless tobacco: Never  Vaping Use   Vaping Use: Never used  Substance and Sexual Activity   Alcohol use: No   Drug use: No   Sexual activity: Yes    Birth  control/protection: Implant    Comment: Nexplanon 09/2016  Other Topics Concern   Not on file  Social History Narrative   Lives with Mom, Sister, & 2 Brothers. No pets at home. No one smokes.   Social Determinants of Health   Financial Resource Strain: Not on file  Food Insecurity: No Food Insecurity (02/27/2021)   Hunger Vital Sign    Worried About Running Out of Food in the Last Year: Never true    Ran Out of Food in the Last Year: Never true  Transportation Needs: Not on file  Physical Activity: Not on file  Stress: Not on file  Social Connections: Not on file  Intimate Partner Violence: Not on file  :  Pertinent items are noted in  HPI.  Exam: Blood pressure (!) 170/105, pulse 86, temperature 98.1 F (36.7 C), temperature source Temporal, resp. rate 16, weight 137 lb 8 oz (62.4 kg), SpO2 95 %. ECOG 1 General appearance: alert and cooperative appeared without distress. Head: atraumatic without any abnormalities. Eyes: conjunctivae/corneas clear. PERRL.  Sclera anicteric. Throat: lips, mucosa, and tongue normal; without oral thrush or ulcers. Resp: clear to auscultation bilaterally without rhonchi, wheezes or dullness to percussion. Cardio: regular rate and rhythm, S1, S2 normal, no murmur, click, rub or gallop GI: soft, non-tender; bowel sounds normal; no masses,  no organomegaly Skin: Skin color, texture, turgor normal. No rashes or lesions Lymph nodes: Cervical, supraclavicular, and axillary nodes normal. Neurologic: Grossly normal without any motor, sensory or deep tendon reflexes. Musculoskeletal: No joint deformity or effusion.      Assessment and Plan:    21 year old woman with:  1.  Mild fluctuating thrombocytopenia noted in 2020 with counts in the high 90s and fluctuating up to a normal range.  She has otherwise normal white cell count and hemoglobin.  Her differential and indices are all close to normal range.  The differential diagnosis was discussed at  this time and this likely represents reactive thrombocytopenia rather than a sign of a hematological disorder.  ITP versus medication effect represents the most likely etiology.  From a management standpoint I recommended repeating CBC for completeness today.  And if her platelet count continues to be around her baseline, no further work-up or intervention is needed.  Risk of bleeding is very low based on these counts.  2.  Ecchymosis: Unclear etiology at this time.  I do not think this is result of a inherited or acquired bleeding disorder.  Her history does not suggest von Willebrand's disease given the recent findings.  Hormonal possibilities versus medication effect is more likely.  I will check coagulation parameters to complete the work-up but no additional investigation is needed if her work-up is otherwise normal.   3.  Follow-up: Will be determined pending her lab results.  45  minutes were dedicated to this visit. The time was spent on reviewing laboratory data, discussing treatment options, discussing differential diagnosis and answering questions regarding future plan.     A copy of this consult has been forwarded to the requesting physician.

## 2022-07-17 ENCOUNTER — Telehealth: Payer: Self-pay | Admitting: *Deleted

## 2022-07-17 NOTE — Telephone Encounter (Addendum)
TC to patient. LVM with message below as directed by Dr. Alen Blew. Gave CC # for patient to contact office for questions or concerns.   ----- Message from Wyatt Portela, MD sent at 07/17/2022  8:22 AM EST ----- Please let her know her labs are okay. No bleeding tendency noted on here test. Her platelets are low but they are not changed from the last few years.

## 2022-10-07 ENCOUNTER — Other Ambulatory Visit: Payer: Self-pay | Admitting: *Deleted

## 2022-10-07 DIAGNOSIS — I5022 Chronic systolic (congestive) heart failure: Secondary | ICD-10-CM

## 2022-10-10 ENCOUNTER — Ambulatory Visit (HOSPITAL_COMMUNITY)
Admission: RE | Admit: 2022-10-10 | Discharge: 2022-10-10 | Disposition: A | Discharge status: Home / Self Care | Payer: Medicare Other | Source: Ambulatory Visit | Attending: Internal Medicine | Admitting: Internal Medicine

## 2022-10-10 ENCOUNTER — Encounter (HOSPITAL_COMMUNITY): Payer: Self-pay | Admitting: Internal Medicine

## 2022-10-10 ENCOUNTER — Ambulatory Visit (HOSPITAL_BASED_OUTPATIENT_CLINIC_OR_DEPARTMENT_OTHER)
Admission: RE | Admit: 2022-10-10 | Discharge: 2022-10-10 | Disposition: A | Discharge status: Home / Self Care | Payer: Medicare Other | Source: Ambulatory Visit

## 2022-10-10 ENCOUNTER — Other Ambulatory Visit (HOSPITAL_COMMUNITY): Payer: Self-pay

## 2022-10-10 VITALS — BP 170/90 | HR 76 | Wt 131.0 lb

## 2022-10-10 DIAGNOSIS — Z905 Acquired absence of kidney: Secondary | ICD-10-CM | POA: Insufficient documentation

## 2022-10-10 DIAGNOSIS — I3139 Other pericardial effusion (noninflammatory): Secondary | ICD-10-CM | POA: Diagnosis not present

## 2022-10-10 DIAGNOSIS — I272 Pulmonary hypertension, unspecified: Secondary | ICD-10-CM | POA: Diagnosis not present

## 2022-10-10 DIAGNOSIS — N186 End stage renal disease: Secondary | ICD-10-CM | POA: Insufficient documentation

## 2022-10-10 DIAGNOSIS — I132 Hypertensive heart and chronic kidney disease with heart failure and with stage 5 chronic kidney disease, or end stage renal disease: Secondary | ICD-10-CM | POA: Insufficient documentation

## 2022-10-10 DIAGNOSIS — I5022 Chronic systolic (congestive) heart failure: Secondary | ICD-10-CM | POA: Diagnosis present

## 2022-10-10 DIAGNOSIS — R221 Localized swelling, mass and lump, neck: Secondary | ICD-10-CM | POA: Insufficient documentation

## 2022-10-10 DIAGNOSIS — Q8781 Alport syndrome: Secondary | ICD-10-CM | POA: Diagnosis not present

## 2022-10-10 DIAGNOSIS — Z79899 Other long term (current) drug therapy: Secondary | ICD-10-CM | POA: Insufficient documentation

## 2022-10-10 DIAGNOSIS — Z940 Kidney transplant status: Secondary | ICD-10-CM | POA: Insufficient documentation

## 2022-10-10 DIAGNOSIS — Z8616 Personal history of COVID-19: Secondary | ICD-10-CM | POA: Diagnosis not present

## 2022-10-10 DIAGNOSIS — Z992 Dependence on renal dialysis: Secondary | ICD-10-CM | POA: Diagnosis not present

## 2022-10-10 LAB — ECHOCARDIOGRAM COMPLETE
Area-P 1/2: 4.41 cm2
S' Lateral: 3.19 cm

## 2022-10-10 NOTE — Progress Notes (Signed)
  Echocardiogram 2D Echocardiogram has been performed.  Dominique Lawrence M 10/10/2022, 1:59 PM

## 2022-10-10 NOTE — Patient Instructions (Signed)
There has been no changes to your medications.  Your physician has requested that you have an echocardiogram. Echocardiography is a painless test that uses sound waves to create images of your heart. It provides your doctor with information about the size and shape of your heart and how well your heart's chambers and valves are working. This procedure takes approximately one hour. There are no restrictions for this procedure. Please do NOT wear cologne, perfume, aftershave, or lotions (deodorant is allowed). Please arrive 15 minutes prior to your appointment time.  Your physician recommends that you schedule a follow-up appointment in: 6 months with an echocardiogram ( August 2024)  ** please call the office in May to arrange your follow up appointment. **  If you have any questions or concerns before your next appointment please send [place] a message through [place] or call our office at [phone].    TO LEAVE A MESSAGE FOR THE NURSE SELECT OPTION 2, PLEASE LEAVE A MESSAGE INCLUDING: YOUR NAME DATE OF BIRTH CALL BACK NUMBER REASON FOR CALL**this is important as we prioritize the call backs  YOU WILL RECEIVE A CALL BACK THE SAME DAY AS LONG AS YOU CALL BEFORE 4:00 PM  At the [facility], you and your health needs are our priority. As part of our continuing mission to provide you with exceptional heart care, we have created designated Provider Care Teams. These Care Teams include your primary Cardiologist (physician) and Advanced Practice Providers (APPs- Physician Assistants and Nurse Practitioners) who all work together to provide you with the care you need, when you need it.   You may see any of the following providers on your designated Care Team at your next follow up: Dr Glori Bickers Dr Loralie Champagne Dr. Roxana Hires, NP Lyda Jester, [place] [facility] [place], [place] Forestine Na, NP Audry Riles, PharmD   Please be sure to bring in all  your medications bottles to every appointment.    Thank you for choosing [facility]

## 2022-10-10 NOTE — Progress Notes (Signed)
[facility]IN[facility]  PCP: Dr Paul Dykes Nephrology: Dr Osborne Casco  Primary HF Cardiologist: Dr Haroldine Laws.  CT Surgery : Dr Darcey Nora  HPI: Dominique Lawrence  is a 22 y/o female w/ h/o chronic renal failure since age 63 secondary to Alport Syndrome. She is on HD MWF and followed by Dr. Justin Mend. 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 Now w/ subsequent secondary hyperparathyroidism, needing total parathyroidectomy.    Of note, she was diagnosed w/ Covid 19 infection 08/2019 and hospitalized, treated w/ decadron and remdesivir . Had 2D echo 08/23/19 showing moderate pericardial effusion but no tamponade physiology, moderate MR/TR and severely reduced LVEF 25-30%. Hs troponin level was mildly elevated but flat trend, 72>>79. ? Myocarditis. Unable to get cMRi due to renal function. HF was treated w/ lasix + HD for volume management + coreg and hydralazine.    She was seen in general cardiology clinic 10/06/19 by Dr. Johnsie Cancel for preoperative clearance prior to planned parathyroidectomy. Echo was repeated and demonstrated increased size of pericardial effusion, w/ progression from moderate to severe. No evidence of tamponade. LVEF 30-35%. RV normal size w/ hyperdynamic systolic function.    Admitted 10/07/19 after RHC/LHC for planned pericardial window. Cath showed normal coronaries, mild pulmonary HTN, EF 35%, and large pericardial effusion. On 10/08/19 had pericardial window with 700 cc removed. Cytology, ANA, ANCA, RF, congo red stain negative. Cytology with reactive mesothelial cells.    Unable to tolerate Bidil due to severe HAs.    Today she returns for HF follow up. Remains on HD 3 days a week.  6 months ago entresto was stopped. Last week losartan was started and increased to 50 mg daily. SBP at home down to 130 . Overall feeling fine. Denies SOB/PND/Orthopnea. Appetite ok. No fever or chills.  Dry weight 59 Kg. Taking all medications but has not had coreg or hydralazine  because she hasn't had lunch. Intolerant isosorbide due to headaches.    Echo today EF 45-50%  Echo 2022 EF ~55% Trivial MR/TR. No effusion.  Echo 01/25/20 EF 25-30% RV moderately down. Severe MR/TR Small effusion   ROS: All systems negative except as listed in HPI, PMH and Problem List.  SH:  Social History   Socioeconomic History   Marital status: Married    Spouse name: Not on file   Number of children: Not on file   Years of education: Not on file   Highest education level: Not on file  Occupational History   Not on file  Tobacco Use   Smoking status: Never   Smokeless tobacco: Never  Vaping Use   Vaping Use: Never used  Substance and Sexual Activity   Alcohol use: No   Drug use: No   Sexual activity: Yes    Birth control/protection: Implant    Comment: Nexplanon 09/2016  Other Topics Concern   Not on file  Social History Narrative   Lives with Mom, Sister, & 2 Brothers. No pets at home. No one smokes.   Social Determinants of Health   Financial Resource Strain: Not on file  Food Insecurity: No Food Insecurity (02/27/2021)   Hunger Vital Sign    Worried About Running Out of Food in the Last Year: Never true    Ran Out of Food in the Last Year: Never true  Transportation Needs: Not on file  Physical Activity: Not on file  Stress: Not on file  Social Connections: Not on file  Intimate Partner Violence: Not on file  FH:  Family History  Problem Relation Age of Onset   Alport syndrome Brother     Past Medical History:  Diagnosis Date   Alport syndrome    Anemia    Chronic systolic CHF (congestive heart failure) ([place])    Constipation 12/18/2013   Depression 07/28/2013   ESRD (end stage renal disease) ([place])     Transplant failed Hemo MWF at [facility].   Hearing loss    Bilateral hearing aids   History of blood transfusion    Hypertension    Influenza B    S/P wisdom tooth extraction 06/22/2016   Right mandibular dental pain    Sinus tachycardia    Wears glasses     Current Outpatient Medications  Medication Sig Dispense Refill   acetaminophen (TYLENOL) 500 MG tablet Take 500 mg by mouth every 6 (six) hours as needed for moderate pain or headache.     amLODipine (NORVASC) 10 MG tablet Take 1 tablet (10 mg total) by mouth daily. Please make overdue appt with Dr. Johnsie Cancel before anymore refills. Thank you 2nd attempt 15 tablet 0   AURYXIA 1 GM 210 MG(Fe) tablet Take 420 mg by mouth 3 (three) times daily with meals.      carvedilol (COREG) 25 MG tablet TAKE 1 TABLET(25 MG) BY MOUTH TWICE DAILY WITH A MEAL 180 tablet 2   hydrALAZINE (APRESOLINE) 100 MG tablet Take 100 mg by mouth 3 (three) times daily.     No current facility-administered medications for this encounter.    Vitals:   10/10/22 1349  BP: (!) 170/90  Pulse: 76  SpO2: 97%  Weight: 59.4 kg (131 lb)    PHYSICAL EXAM: General:  Well appearing. No resp difficulty HEENT: normal Neck: supple. JVP 9-10 D. Carotids 2+ bilat; no bruits. No lymphadenopathy or thryomegaly appreciated. Cor: PMI nondisplaced. Regular rate & rhythm. No rubs, gallops or murmurs. Lungs: clear Abdomen: soft, nontender, nondistended. No hepatosplenomegaly. No bruits or masses. Good bowel sounds. Extremities: no cyanosis, clubbing, rash, R and LLE 1+ edema. LUE AVF Neuro: alert & orientedx3, cranial nerves grossly intact. moves all 4 extremities w/o difficulty. Affect pleasant  ASSESSMENT & PLAN   1. Pericardial Effusion: large posterior effusion noted on echo 1/21, previously moderate on prior echo 12/20  - s/p pericardial window on 1/29. 800cc fluid.  Pericardial biopsy sent for amyloid (negative) . Cultures negative. Cytology negative -minimal effusion on echo 5/21 - no effusion on echo today   2. Chronic systolic HF: new diagnosed made 08/2019 while admitted for covid 19 infection. EF 25-30%. Hs troponin level at time of initial diganosis was mildly elevated but flat  trend, 72>>79. ? Viral Myocarditis vs HTN. Unable to get cMRi due to renal function.  - Repeat Echo 1/21 EF 30-35%, global hypokinesis, mod LVH, RV normal size and hyperdynamic systolic function - Echo 0258NI ~55% Trivial MR/TR. No effusion.  - Echo today EF 45-50%  - Cath no CAD - NYHA II - Continue carvedilol 25 bid  - Failed bidil due to severe HA - Continue hydralazine 100 mg three times a day.  -Continue losartan 50 mg daily. --> she takes at bed time . We will check on cost---> less $2.00  fro 90 days.    3. ESRD: chronic renal failure since age 22 secondary to Alport Syndrome. 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  -Remains on HD 3 days a week.   - management per nephrology - Delene Loll stopped by  nephrology. Now on losartan.    4. HTN - elevated. Needs tighter control.  - Remains on amlodipine +coreg + hydralazine.   5. Neck swelling - s/p parathyroidectomy   Follow up in 1 year,   Darrick Grinder, NP  2:12 PM  Patient seen and examined with the above-signed Advanced Practice Provider and/or Housestaff. I personally reviewed laboratory data, imaging studies and relevant notes. I independently examined the patient and formulated the important aspects of the plan. I have edited the note to reflect any of my changes or salient points. I have personally discussed the plan with the patient and/or family.  Feels ok. Says BP running high at HD after Entresto switched to losartan. + mild edema. Reports compliance with meds. Ef=cho today EF 45-50% (down slightly from before)  General:  Well appearing. No resp difficulty HEENT: normal Neck: supple.JVP 7-8  Carotids 2+ bilat; no bruits. No lymphadenopathy or thryomegaly appreciated. Cor: PMI nondisplaced. Regular rate & rhythm. No rubs, gallops or murmurs. Lungs: clear Abdomen: soft, nontender, nondistended. No hepatosplenomegaly. No bruits or masses. Good bowel sounds. Extremities: no cyanosis,  clubbing, rash, 1+ edema Neuro: alert & orientedx3, cranial nerves grossly intact. moves all 4 extremities w/o difficulty. Affect pleasant  She is volume overloaded and hypertensive today. EF slightly down from previous. I spoke to Dr. Joylene Grapes by phone and he informed me that she has been skipping HD sessions. We discussed her BP management. He will follow and if BP remains elevated we will switch losartan back to [facility]. Encouraged her to be more compliant.   Glori Bickers, MD  7:32 PM

## 2023-01-21 ENCOUNTER — Telehealth: Payer: Medicare Other | Admitting: Nurse Practitioner

## 2023-01-21 DIAGNOSIS — L03211 Cellulitis of face: Secondary | ICD-10-CM

## 2023-01-21 NOTE — Progress Notes (Signed)
,  Because of the severity of this infection, I feel your condition warrants further evaluation and I recommend that you be seen in a face to face visit.   NOTE: There will be NO CHARGE for this eVisit   If you are having a true medical emergency please call 911.      For an urgent face to face visit, [facility] has eight urgent care centers for your convenience:   NEW!! [facility] Health Urgent Care Center at [facility] Get Driving Directions [phone] [address] [place], [address]    [facility] Health Urgent Care Center at [facility] Get Driving Directions [phone] [address] [place], [place] [address]   [facility] Health Urgent Care Center [facility]) Get Driving Directions [phone] [address] [place], [place] [address]  [facility] Health Urgent Care Center [facility]) Get Driving Directions [phone] [address] [place],  [place]  [address]  [facility] Health Urgent Care Center [facility] - at [employer]  [phone] [address] [place],  [place] [address]   [facility] Health Urgent Care at [facility] Get Driving Directions [phone] [address] [place], [place] [address]   [facility] Health Urgent Care at [facility] Get Driving Directions  [phone] [address] [place], [place] [address]   [facility] Health Urgent Care at [facility] Directions [phone] [address] [place], [place] [address]  Your MyChart E-visit questionnaire answers were reviewed by a board certified advanced clinical practitioner to complete your personal care plan based on your specific symptoms.  Thank you for using e-Visits.

## 2023-02-13 ENCOUNTER — Ambulatory Visit (INDEPENDENT_AMBULATORY_CARE_PROVIDER_SITE_OTHER): Payer: Medicare Other | Admitting: Physician Assistant

## 2023-02-13 VITALS — BP 175/101 | HR 75 | Temp 97.7°F | Resp 18 | Ht 64.0 in | Wt 129.2 lb

## 2023-02-13 DIAGNOSIS — T82898A Other specified complication of vascular prosthetic devices, implants and grafts, initial encounter: Secondary | ICD-10-CM

## 2023-02-13 DIAGNOSIS — I871 Compression of vein: Secondary | ICD-10-CM

## 2023-02-13 DIAGNOSIS — N186 End stage renal disease: Secondary | ICD-10-CM | POA: Diagnosis not present

## 2023-02-13 DIAGNOSIS — Z992 Dependence on renal dialysis: Secondary | ICD-10-CM | POA: Diagnosis not present

## 2023-02-13 NOTE — H&P (View-Only) (Signed)
  Office Note   History of Present Illness   Dominique Lawrence is a 22 y.o. (06/22/2001) female who presents for follow up for dialysis access.  The patient has a history of left brachiocephalic fistula creation on 02/10/2020 by Dr. Brabham.   She returns today for evaluation of her fistula.  She states over the past few years her fistula has gotten slightly larger.  Over the past 3 to 4 months it has gotten significantly larger.  Her veins have also been bulging up into her axilla on the left.  She has noticed some skin changes and dialysis has been avoiding these areas for cannulation.  She has no issues during dialysis with her fistula, but over the past week she has been experiencing prolonged bleeding after cannulation.  She dialyzes on Mondays, Wednesdays, and Fridays at [facility] on [address].  Current Outpatient Medications  Medication Sig Dispense Refill   acetaminophen (TYLENOL) 500 MG tablet Take 500 mg by mouth every 6 (six) hours as needed for moderate pain or headache.     amLODipine (NORVASC) 10 MG tablet Take 1 tablet (10 mg total) by mouth daily. Please make overdue appt with Dr. Nishan before anymore refills. Thank you 2nd attempt 15 tablet 0   AURYXIA 1 GM 210 MG(Fe) tablet Take 420 mg by mouth 3 (three) times daily with meals.      carvedilol (COREG) 25 MG tablet TAKE 1 TABLET(25 MG) BY MOUTH TWICE DAILY WITH A MEAL 180 tablet 2   hydrALAZINE (APRESOLINE) 100 MG tablet Take 100 mg by mouth 3 (three) times daily.     losartan (COZAAR) 25 MG tablet Take 50 mg by mouth at bedtime.     No current facility-administered medications for this visit.    REVIEW OF SYSTEMS (negative unless checked):   Cardiac:  [] Chest pain or chest pressure? [] Shortness of breath upon activity? [] Shortness of breath when lying flat? [] Irregular heart rhythm?  Vascular:  [] Pain in calf, thigh, or hip brought on by walking? [] Pain in feet at night that wakes you up from your  sleep? [] Blood clot in your veins? [] Leg swelling?  Pulmonary:  [] Oxygen at home? [] Productive cough? [] Wheezing?  Neurologic:  [] Sudden weakness in arms or legs? [] Sudden numbness in arms or legs? [] Sudden onset of difficult speaking or slurred speech? [] Temporary loss of vision in one eye? [] Problems with dizziness?  Gastrointestinal:  [] Blood in stool? [] Vomited blood?  Genitourinary:  [] Burning when urinating? [] Blood in urine?  Psychiatric:  [] Major depression  Hematologic:  [] Bleeding problems? [] Problems with blood clotting?  Dermatologic:  [] Rashes or ulcers?  Constitutional:  [] Fever or chills?  Ear/Nose/Throat:  [] Change in hearing? [] Nose bleeds? [] Sore throat?  Musculoskeletal:  [] Back pain? [] Joint pain? [] Muscle pain?   Physical Examination   Vitals:   02/13/23 1037  BP: (!) 175/101  Pulse: 75  Resp: 18  Temp: 97.7 F (36.5 C)  TempSrc: Temporal  SpO2: 98%  Weight: 129 lb 3.2 oz (58.6 kg)  Height: 5' 4" (1.626 m)   Body mass index is 22.18 kg/m.  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  There is also enlargement and pulsatility of the left axillary vein.  There are 2 ulcers overlying the fistula and the skin cannot be easily mobilized surrounding these ulcers. Musculoskeletal: no muscle wasting or atrophy  Neurologic: A&O X 3;  No focal weakness or paresthesias are detected Psychiatric:  The pt has Normal affect.     Medical Decision Making   Dominique Lawrence is a 22 y.o. female who presents for fistula evaluation  The patient's left brachiocephalic  fistula was created in 2021 by Dr. Brabham.  Over time this fistula has enlarged with no significant changes over the past few months.  The patient states she has noted a greatly increased size of the fistula for about 3 to 4 months and enlargement of the veins near her left armpit and chest Her fistula works well at dialysis with no difficulties during cannulation.  She has had some prolonged bleeding episodes from the fistula for the past week Her fistula also has 2 ulcerations on the skin.  The skin cannot be easily mobilized surrounding the ulcers.  She denies any bleeding episodes from these ulcers. She would need plication/revision of this fistula to reduce its size and get rid of the ulcerations. 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  This is suggestive of central venous stenosis.  The patient would benefit from fistulogram to address possible stenosis. We will schedule the patient for left brachiocephalic fistulogram with possible intervention in the next 1 to 2 weeks.  This will be done on a Tuesday or Thursday with Dr. Brabham.  The patient is aware she will need revision/plication of her fistula and likely concurrent TDC placement after her fistulogram.  She will need to use her TDC and rest the fistula after her revision surgery until her arm is healed. For the time being she can continue to use her fistula. I told her that her dialysis center needs to avoid sticking around her ulcers   Aldean Suddeth PA-C [facility] [place] Office: [phone]  Clinic MD: [place] 

## 2023-02-13 NOTE — Progress Notes (Signed)
Office Note   History of Present Illness   Dominique Lawrence is a 22 y.o. (22-Dec-2000) female who presents for follow up for dialysis access.  The patient has a history of left brachiocephalic fistula creation on 02/10/2020 by Dr. Myra Gianotti.   She returns today for evaluation of her fistula.  She states over the past few years her fistula has gotten slightly larger.  Over the past 3 to 4 months it has gotten significantly larger.  Her veins have also been bulging up into her axilla on the left.  She has noticed some skin changes and dialysis has been avoiding these areas for cannulation.  She has no issues during dialysis with her fistula, but over the past week she has been experiencing prolonged bleeding after cannulation.  She dialyzes on Mondays, Wednesdays, and Fridays at [facility] on [address].  Current Outpatient Medications  Medication Sig Dispense Refill   acetaminophen (TYLENOL) 500 MG tablet Take 500 mg by mouth every 6 (six) hours as needed for moderate pain or headache.     amLODipine (NORVASC) 10 MG tablet Take 1 tablet (10 mg total) by mouth daily. Please make overdue appt with Dr. Eden Emms before anymore refills. Thank you 2nd attempt 15 tablet 0   AURYXIA 1 GM 210 MG(Fe) tablet Take 420 mg by mouth 3 (three) times daily with meals.      carvedilol (COREG) 25 MG tablet TAKE 1 TABLET(25 MG) BY MOUTH TWICE DAILY WITH A MEAL 180 tablet 2   hydrALAZINE (APRESOLINE) 100 MG tablet Take 100 mg by mouth 3 (three) times daily.     losartan (COZAAR) 25 MG tablet Take 50 mg by mouth at bedtime.     No current facility-administered medications for this visit.    REVIEW OF SYSTEMS (negative unless checked):   Cardiac:  []  Chest pain or chest pressure? []  Shortness of breath upon activity? []  Shortness of breath when lying flat? []  Irregular heart rhythm?  Vascular:  []  Pain in calf, thigh, or hip brought on by walking? []  Pain in feet at night that wakes you up from your  sleep? []  Blood clot in your veins? []  Leg swelling?  Pulmonary:  []  Oxygen at home? []  Productive cough? []  Wheezing?  Neurologic:  []  Sudden weakness in arms or legs? []  Sudden numbness in arms or legs? []  Sudden onset of difficult speaking or slurred speech? []  Temporary loss of vision in one eye? []  Problems with dizziness?  Gastrointestinal:  []  Blood in stool? []  Vomited blood?  Genitourinary:  []  Burning when urinating? []  Blood in urine?  Psychiatric:  []  Major depression  Hematologic:  []  Bleeding problems? []  Problems with blood clotting?  Dermatologic:  []  Rashes or ulcers?  Constitutional:  []  Fever or chills?  Ear/Nose/Throat:  []  Change in hearing? []  Nose bleeds? []  Sore throat?  Musculoskeletal:  []  Back pain? []  Joint pain? []  Muscle pain?   Physical Examination   Vitals:   02/13/23 1037  BP: (!) 175/101  Pulse: 75  Resp: 18  Temp: 97.7 F (36.5 C)  TempSrc: Temporal  SpO2: 98%  Weight: 129 lb 3.2 oz (58.6 kg)  Height: 5\' 4"  (1.626 m)   Body mass index is 22.18 kg/m.  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  There is also enlargement and pulsatility of the left axillary vein.  There are 2 ulcers overlying the fistula and the skin cannot be easily mobilized surrounding these ulcers. Musculoskeletal: no muscle wasting or atrophy  Neurologic: A&O X 3;  No focal weakness or paresthesias are detected Psychiatric:  The pt has Normal affect.     Medical Decision Making   [name] is a 22 y.o. female who presents for fistula evaluation  The patient's left brachiocephalic  fistula was created in 2021 by Dr. Myra Gianotti.  Over time this fistula has enlarged with no significant changes over the past few months.  The patient states she has noted a greatly increased size of the fistula for about 3 to 4 months and enlargement of the veins near her left armpit and chest Her fistula works well at dialysis with no difficulties during cannulation.  She has had some prolonged bleeding episodes from the fistula for the past week Her fistula also has 2 ulcerations on the skin.  The skin cannot be easily mobilized surrounding the ulcers.  She denies any bleeding episodes from these ulcers. She would need plication/revision of this fistula to reduce its size and get rid of the ulcerations. 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  This is suggestive of central venous stenosis.  The patient would benefit from fistulogram to address possible stenosis. We will schedule the patient for left brachiocephalic fistulogram with possible intervention in the next 1 to 2 weeks.  This will be done on a Tuesday or Thursday with Dr. Myra Gianotti.  The patient is aware she will need revision/plication of her fistula and likely concurrent TDC placement after her fistulogram.  She will need to use her [facility] and rest the fistula after her revision surgery until her arm is healed. For the time being she can continue to use her fistula. I told her that her dialysis center needs to avoid sticking around her ulcers   Loel Dubonnet PA-C [facility] [place] Office: [phone]  Clinic MD: Edilia Bo

## 2023-02-17 ENCOUNTER — Telehealth: Payer: Self-pay

## 2023-02-17 NOTE — Telephone Encounter (Signed)
Attempted to reach patient to schedule fistulogram. Left VM for patient to return call.  

## 2023-02-18 ENCOUNTER — Other Ambulatory Visit: Payer: Self-pay

## 2023-02-18 DIAGNOSIS — T82898A Other specified complication of vascular prosthetic devices, implants and grafts, initial encounter: Secondary | ICD-10-CM

## 2023-02-18 DIAGNOSIS — N186 End stage renal disease: Secondary | ICD-10-CM

## 2023-02-18 MED ORDER — SODIUM CHLORIDE 0.9% FLUSH: 3.0000 mL | Freq: Two times a day (BID) | INTRAVENOUS | Status: AC

## 2023-02-18 MED ORDER — SODIUM CHLORIDE 0.9 % IV SOLN: 250.0000 mL | INTRAVENOUS | Status: AC | PRN

## 2023-02-25 ENCOUNTER — Other Ambulatory Visit: Payer: Self-pay | Admitting: Obstetrics & Gynecology

## 2023-02-25 ENCOUNTER — Ambulatory Visit (INDEPENDENT_AMBULATORY_CARE_PROVIDER_SITE_OTHER): Payer: Medicare Other | Admitting: Obstetrics & Gynecology

## 2023-02-25 ENCOUNTER — Other Ambulatory Visit: Payer: Self-pay

## 2023-02-25 VITALS — BP 171/96 | HR 70 | Wt 126.2 lb

## 2023-02-25 DIAGNOSIS — R3983 Unilateral non-palpable testicle: Secondary | ICD-10-CM

## 2023-02-25 DIAGNOSIS — Z992 Dependence on renal dialysis: Secondary | ICD-10-CM

## 2023-02-25 DIAGNOSIS — N912 Amenorrhea, unspecified: Secondary | ICD-10-CM | POA: Diagnosis not present

## 2023-02-25 DIAGNOSIS — N186 End stage renal disease: Secondary | ICD-10-CM | POA: Diagnosis not present

## 2023-02-25 DIAGNOSIS — Z3200 Encounter for pregnancy test, result unknown: Secondary | ICD-10-CM

## 2023-02-25 DIAGNOSIS — N970 Female infertility associated with anovulation: Secondary | ICD-10-CM

## 2023-02-25 MED ORDER — MEGESTROL ACETATE 40 MG PO TABS
40.0000 mg | ORAL_TABLET | Freq: Every day | ORAL | 1 refills | Status: AC
Start: 2023-02-25 — End: ?

## 2023-02-25 NOTE — Progress Notes (Signed)
Tried calling patient x3

## 2023-02-25 NOTE — Progress Notes (Signed)
Patient ID: Dominique Lawrence, female   DOB: [birth year], 22 y.o.   MRN: [id number]  Chief Complaint  Patient presents with   contraception concerns  No period since Nexplanon was removed 12/2021  HPI Dominique Lawrence is a 22 y.o. female.  No obstetric history on file. No LMP recorded. Patient has had an implant. She uses condoms for [facility] since removal of her Nexplanon. H/o ESRD, working to get on transplant list.  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   Hearing loss    Bilateral hearing aids   History of blood transfusion    Hypertension    Influenza B    S/P wisdom tooth extraction 06/22/2016   Right mandibular dental pain   Sinus tachycardia    Wears glasses     Past Surgical History:  Procedure Laterality Date   AV FISTULA PLACEMENT Left 02/10/2020   Procedure: ARTERIOVENOUS (AV) FISTULA CREATION;  Surgeon: Nada Libman, MD;  Location: [facility] OR;  Service: Vascular;  Laterality: Left;   CENTRAL VENOUS CATHETER INSERTION  10/08/2019   Procedure: Insertion Central Line Adult;  Surgeon: Donata Clay, Theron Arista, MD;  Location[facility] OR;  Service: Thoracic;;   Hemodialysis catheter placed Right    IR REMOVAL TUN CV CATH W/O FL  06/15/2020   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   NEPHRECTOMY Right    PARATHYROIDECTOMY  07/17/2020   TOTAL PARATHYROIDECTOMY WITH AUTOTRANSPLANT (N/A   PARATHYROIDECTOMY Left 07/17/2020   Procedure: TOTAL PARATHYROIDECTOMY WITH AUTOTRANSPLANT;  Surgeon: Darnell Level, MD;  Location: [facility] OR;  Service: General;  Laterality: Left;  Left arm is location of autoransplant   RIGHT/LEFT HEART CATH AND CORONARY ANGIOGRAPHY N/A 10/07/2019   Procedure: RIGHT/LEFT HEART CATH AND CORONARY ANGIOGRAPHY;  Surgeon: Dolores Patty, MD;   Location: [facility] INVASIVE CV LAB;  Service: Cardiovascular;  Laterality: N/A;   SUBXYPHOID PERICARDIAL WINDOW N/A 10/08/2019   Procedure: SUBXYPHOID PERICARDIAL WINDOW;  Surgeon: Kerin Perna, MD;  Location: [facility] OR;  Service: Thoracic;  Laterality: N/A;   TEE WITHOUT CARDIOVERSION N/A 10/08/2019   Procedure: TRANSESOPHAGEAL ECHOCARDIOGRAM (TEE);  Surgeon: Donata Clay, Theron Arista, MD;  Location: [facility] OR;  Service: Thoracic;  Laterality: N/A;   WISDOM TOOTH EXTRACTION      Family History  Problem Relation Age of Onset   Alport syndrome Brother     Social History Social History   Tobacco Use   Smoking status: Never   Smokeless tobacco: Never  Vaping Use   Vaping Use: Never used  Substance Use Topics   Alcohol use: No   Drug use: No    Allergies  Allergen Reactions   Enalapril Other (See Comments)    AKI   Chlorhexidine Gluconate Hives   Ibuprofen Other (See Comments)    Pt not able to take ibuprofen due to Alport's syndrome   Nsaids Other (See Comments)    Other reaction(s): Unknown  AKI    Current Outpatient Medications  Medication Sig Dispense Refill   acetaminophen (TYLENOL) 500 MG tablet Take 500 mg by mouth every 6 (six) hours as needed for moderate pain or headache.     amLODipine (NORVASC) 10 MG tablet Take 1 tablet (10 mg total) by mouth daily. Please make overdue appt  with Dr. Eden Emms before anymore refills. Thank you 2nd attempt 15 tablet 0   carvedilol (COREG) 25 MG tablet TAKE 1 TABLET(25 MG) BY MOUTH TWICE DAILY WITH A MEAL 180 tablet 2   hydrALAZINE (APRESOLINE) 100 MG tablet Take 100 mg by mouth 3 (three) times daily.     losartan (COZAAR) 25 MG tablet Take 50 mg by mouth at bedtime.     megestrol (MEGACE) 40 MG tablet Take 1 tablet (40 mg total) by mouth daily. 14 tablet 1   Current Facility-Administered Medications  Medication Dose Route Frequency Provider Last Rate Last Admin   0.9 %  sodium chloride infusion  250 mL Intravenous PRN Nada Libman, MD        sodium chloride flush (NS) 0.9 % injection 3 mL  3 mL Intravenous Q12H Nada Libman, MD        Review of Systems Review of Systems  Constitutional: Negative.   Endocrine: Polyuria: occasional nipple sensitivity.  Genitourinary:        Anuria    Blood pressure (!) 189/101, pulse 75, weight 126 lb 3.2 oz (57.2 kg).  Physical Exam Physical Exam Vitals and nursing note reviewed.  Constitutional:      Appearance: Normal appearance.  Cardiovascular:     Rate and Rhythm: Normal rate.  Pulmonary:     Effort: Pulmonary effort is normal.  Neurological:     Mental Status: She is alert.  Psychiatric:        Mood and Affect: Mood normal.        Behavior: Behavior normal.     Data Reviewed   Assessment Amenorrhea - Plan: Follicle stimulating hormone, Estrogens, Total, megestrol (MEGACE) 40 MG tablet, Prolactin  Encounter for pregnancy test, result unknown  Anovulation - Plan: Beta hCG quant (ref lab)  Unilateral non-palpable testicle - Plan: Beta hCG quant (ref lab)  ESRD (end stage renal disease) on dialysis [facility])   Plan Orders Placed This Encounter  Procedures   Follicle stimulating hormone   Estrogens, Total   Beta hCG quant (ref lab)   Prolactin  RTC to review response to progestin challenge May repeat Megace if chronically anovulatory due to ESRD     Scheryl Darter 02/25/2023, 2:26 PM

## 2023-02-26 LAB — PROLACTIN: Prolactin: 18.6 ng/mL (ref 4.8–33.4)

## 2023-02-28 LAB — BETA HCG QUANT (REF LAB): hCG Quant: <1 m[IU]/mL

## 2023-02-28 LAB — ESTROGENS, TOTAL: Estrogen: 285 pg/mL

## 2023-02-28 LAB — FOLLICLE STIMULATING HORMONE: FSH: 12 m[IU]/mL

## 2023-03-04 ENCOUNTER — Other Ambulatory Visit: Payer: Self-pay

## 2023-03-04 ENCOUNTER — Ambulatory Visit (HOSPITAL_COMMUNITY)
Admission: RE | Admit: 2023-03-04 | Discharge: 2023-03-04 | Disposition: A | Discharge status: Home / Self Care | Payer: Medicare Other | Attending: Surgery | Admitting: Surgery

## 2023-03-04 ENCOUNTER — Encounter (HOSPITAL_COMMUNITY)
Admission: RE | Disposition: A | Discharge status: Home / Self Care | Payer: Self-pay | Source: Home / Self Care | Attending: Surgery

## 2023-03-04 DIAGNOSIS — N185 Chronic kidney disease, stage 5: Secondary | ICD-10-CM

## 2023-03-04 DIAGNOSIS — N186 End stage renal disease: Secondary | ICD-10-CM | POA: Diagnosis not present

## 2023-03-04 DIAGNOSIS — Z992 Dependence on renal dialysis: Secondary | ICD-10-CM | POA: Insufficient documentation

## 2023-03-04 DIAGNOSIS — T82510A Breakdown (mechanical) of surgically created arteriovenous fistula, initial encounter: Secondary | ICD-10-CM | POA: Diagnosis present

## 2023-03-04 DIAGNOSIS — T82898A Other specified complication of vascular prosthetic devices, implants and grafts, initial encounter: Secondary | ICD-10-CM

## 2023-03-04 DIAGNOSIS — Y841 Kidney dialysis as the cause of abnormal reaction of the patient, or of later complication, without mention of misadventure at the time of the procedure: Secondary | ICD-10-CM | POA: Insufficient documentation

## 2023-03-04 HISTORY — PX: A/V FISTULAGRAM: CATH118298

## 2023-03-04 LAB — POCT I-STAT, CHEM 8
BUN: 35 mg/dL — ABNORMAL HIGH (ref 6–20)
Calcium, Ion: 1.14 mmol/L — ABNORMAL LOW (ref 1.15–1.40)
Chloride: 99 mmol/L (ref 98–111)
Creatinine, Ser: 6.1 mg/dL — ABNORMAL HIGH (ref 0.44–1.00)
Glucose, Bld: 83 mg/dL (ref 70–99)
HCT: 38 % (ref 36.0–46.0)
Hemoglobin: 12.9 g/dL (ref 12.0–15.0)
Potassium: 4.6 mmol/L (ref 3.5–5.1)
Sodium: 139 mmol/L (ref 135–145)
TCO2: 29 mmol/L (ref 22–32)

## 2023-03-04 LAB — HCG, SERUM, QUALITATIVE: Preg, Serum: NEGATIVE

## 2023-03-04 SURGERY — A/V FISTULAGRAM
Anesthesia: LOCAL | Laterality: Left

## 2023-03-04 MED ORDER — MIDAZOLAM HCL 2 MG/2ML IJ SOLN
INTRAMUSCULAR | Status: AC
  Filled 2023-03-04: qty 2

## 2023-03-04 MED ORDER — MIDAZOLAM HCL 2 MG/2ML IJ SOLN
INTRAMUSCULAR | Status: DC | PRN
  Administered 2023-03-04: 1 mg via INTRAVENOUS

## 2023-03-04 MED ORDER — FENTANYL CITRATE (PF) 100 MCG/2ML IJ SOLN
INTRAMUSCULAR | Status: AC
  Filled 2023-03-04: qty 2

## 2023-03-04 MED ORDER — LIDOCAINE HCL (PF) 1 % IJ SOLN
INTRAMUSCULAR | Status: DC | PRN
  Administered 2023-03-04: 5 mL

## 2023-03-04 MED ORDER — SODIUM CHLORIDE 0.9% FLUSH: 3.0000 mL | INTRAVENOUS | Status: DC | PRN

## 2023-03-04 MED ORDER — IODIXANOL 320 MG/ML IV SOLN
INTRAVENOUS | Status: DC | PRN
  Administered 2023-03-04: 30 mL

## 2023-03-04 MED ORDER — FENTANYL CITRATE (PF) 100 MCG/2ML IJ SOLN
INTRAMUSCULAR | Status: DC | PRN
  Administered 2023-03-04: 25 ug via INTRAVENOUS

## 2023-03-04 MED ORDER — LIDOCAINE HCL (PF) 1 % IJ SOLN
INTRAMUSCULAR | Status: AC
  Filled 2023-03-04: qty 30

## 2023-03-04 MED ORDER — HEPARIN (PORCINE) IN NACL 1000-0.9 UT/500ML-% IV SOLN
INTRAVENOUS | Status: DC | PRN
  Administered 2023-03-04: 500 mL

## 2023-03-04 SURGICAL SUPPLY — 9 items
BAG SNAP BAND KOVER 36X36 (MISCELLANEOUS) ×2 IMPLANT
COVER DOME SNAP 22 D (MISCELLANEOUS) ×2 IMPLANT
KIT MICROPUNCTURE NIT STIFF (SHEATH) IMPLANT
PROTECTION STATION PRESSURIZED (MISCELLANEOUS) ×2
SHEATH PROBE COVER 6X72 (BAG) ×2 IMPLANT
STATION PROTECTION PRESSURIZED (MISCELLANEOUS) ×2 IMPLANT
STOPCOCK MORSE 400PSI 3WAY (MISCELLANEOUS) ×2 IMPLANT
TRAY PV CATH (CUSTOM PROCEDURE TRAY) ×2 IMPLANT
TUBING CIL FLEX 10 FLL-RA (TUBING) ×2 IMPLANT

## 2023-03-04 NOTE — Interval H&P Note (Signed)
History and Physical Interval Note:  03/04/2023 7:32 AM  Dominique Lawrence  has presented today for surgery, with the diagnosis of instage renal - aneurysm of fistula.  The various methods of treatment have been discussed with the patient and family. After consideration of risks, benefits and other options for treatment, the patient has consented to  Procedure(s): A/V Fistulagram (Left) as a surgical intervention.  The patient's history has been reviewed, patient examined, no change in status, stable for surgery.  I have reviewed the patient's chart and labs.  Questions were answered to the patient's satisfaction.     Durene Cal

## 2023-03-04 NOTE — Op Note (Signed)
    Patient name: Dominique Lawrence MRN: [id number] DOB: September 19, 2000 Sex: female  03/04/2023 Pre-operative Diagnosis: ESRD Post-operative diagnosis:  Same Surgeon:  Durene Cal Procedure Performed:  1.  U/s guided access, left cephalic vein fistula  2.  Fistulogram  3.  Conscious sedation   Indications: This is a 22 year old female with end-stage renal disease and a left arm fistula which has become very ectatic with ulceration.  She comes in today for fistulogram  Procedure:  The patient was identified in the holding area and taken to room 8.  The patient was then placed supine on the table and prepped and draped in the usual sterile fashion.  A time out was called.  Conscious sedation was administered with the use of IV fentanyl and Versed under continuous physician and nurse monitoring.  Heart rate, blood pressure, and oxygen saturations were continuously monitored.  Total sedation time was 12 minutes ultrasound was used to evaluate the fistula.  The vein was patent and compressible.  A digital ultrasound image was acquired.  The fistula was then accessed under ultrasound guidance using a micropuncture needle.  An 018 wire was then asvanced without resistance and a micropuncture sheath was placed.  Contrast injections were then performed through the sheath.  Findings: Central venous system is widely patent without significant stenosis.  Appearance of the upper arm is ectatic but patent without significant stenosis.  The arterial anastomosis was barely evaluated in the appear to be in significant stenosis.  Monocryl was used to close the fistula access site     Impression:  #1  No significant stenosis noted within the Fistula  #2  Patient will be scheduled for plication.  This will be out of the 2 aneurysmal segments as well as an area up around her shoulder   V. Durene Cal, M.D., [facility] [place] Office: [phone] Pager:  [phone]

## 2023-03-05 ENCOUNTER — Encounter (HOSPITAL_COMMUNITY): Payer: Self-pay | Admitting: Surgery

## 2023-03-18 ENCOUNTER — Other Ambulatory Visit: Payer: Self-pay

## 2023-03-18 ENCOUNTER — Encounter (HOSPITAL_COMMUNITY): Payer: Self-pay | Admitting: Surgery

## 2023-03-18 NOTE — Progress Notes (Signed)
PCP - [facility]  Cardiologist - Dr Gala Romney Nephrology - Dr Melanee Spry  Chest x-ray - 02/06/23 (2V) CE EKG - 10/10/22 Stress Test - n/a ECHO - 10/10/22 Cardiac Cath - 10/07/19  ICD Pacemaker/Loop - n/a  Sleep Study -  n/a CPAP - none  Diabetes Type - n/a  NPO  Anesthesia review: Yes  STOP now taking any Aspirin (unless otherwise instructed by your surgeon), Aleve, Naproxen, Ibuprofen, Motrin, Advil, Goody's, BC's, all herbal medications, fish oil, and all vitamins.   Coronavirus Screening Do you have any of the following symptoms:  Cough yes/no: No Fever (>100.59F)  yes/no: No Runny nose yes/no: No Sore throat yes/no: No Difficulty breathing/shortness of breath  yes/no: No  Have you traveled in the last 14 days and where? yes/no: No  Patient verbalized understanding of instructions that were given via phone.

## 2023-03-19 NOTE — Anesthesia Preprocedure Evaluation (Addendum)
Anesthesia Evaluation  Patient identified by MRN, date of birth, ID band Patient awake    Reviewed: Allergy & Precautions, NPO status , Patient's Chart, lab work & pertinent test results  Airway Mallampati: II  TM Distance: >3 FB Neck ROM: Full    Dental no notable dental hx. (+) Dental Advisory Given   Pulmonary shortness of breath   Pulmonary exam normal breath sounds clear to auscultation       Cardiovascular hypertension, Pt. on medications +CHF  Normal cardiovascular exam Rhythm:Regular Rate:Normal    TTE 10/10/2022:  1. Left ventricular ejection fraction, by estimation, is 40 to 45%. Left ventricular ejection fraction by 3D volume is 42 %. The left ventricle has mildly decreased function. The left ventricle demonstrates global hypokinesis. The left ventricular  internal cavity size was mildly dilated. There is severe concentric left ventricular hypertrophy. Left ventricular diastolic parameters are consistent with Grade III diastolic dysfunction (restrictive). Elevated left ventricular end-diastolic pressure.  The average left ventricular global longitudinal strain is -11.2 %. The global longitudinal strain is abnormal.   2. Right ventricular systolic function is moderately reduced. The right ventricular size is normal. There is moderately elevated pulmonary artery systolic pressure.   3. Left atrial size was severely dilated.   4. Right atrial size was moderately dilated.   5. The mitral valve is abnormal. Trivial mitral valve regurgitation. No evidence of mitral stenosis.   6. Tricuspid valve regurgitation is mild to moderate.   7. The aortic valve is tricuspid. Aortic valve regurgitation is trivial. No aortic stenosis is present.   8. The inferior vena cava is dilated in size with <50% respiratory variability, suggesting right atrial pressure of 15 mmHg.      ECHO: 1. Left ventricular ejection fraction, by estimation, is 25  to 30%. The left ventricle has severely decreased function. The left ventricle demonstrates global hypokinesis. The left ventricular internal cavity size was mildly dilated. There is mild left ventricular hypertrophy. Left ventricular diastolic parameters are indeterminate. 2. Right ventricular systolic function is moderately reduced. The right ventricular size is mildly enlarged. There is mildly elevated pulmonary artery systolic pressure. 3. Left atrial size was moderately dilated. 4. Right atrial size was mild to moderately dilated. 5. The mitral valve is grossly normal. Severe mitral valve regurgitation. 6. Tricuspid valve regurgitation is severe. 7. The aortic valve is normal in structure. Aortic valve regurgitation is not visualized. No aortic stenosis is present. 8. Pulmonic valve regurgitation is moderate. 9. Moderately dilated pulmonary artery.   Neuro/Psych  PSYCHIATRIC DISORDERS  Depression    Hearing loss negative neurological ROS     GI/Hepatic negative GI ROS, Neg liver ROS,,,  Endo/Other  negative endocrine ROS    Renal/GU ESRF and DialysisRenal disease     Musculoskeletal negative musculoskeletal ROS (+)    Abdominal   Peds  Hematology  (+) Blood dyscrasia, anemia   Anesthesia Other Findings   Reproductive/Obstetrics hcg negative                              Anesthesia Physical Anesthesia Plan  ASA: 4  Anesthesia Plan: General   Post-op Pain Management: Tylenol PO (pre-op)*   Induction: Intravenous  PONV Risk Score and Plan: 3 and Ondansetron, Dexamethasone, Midazolam and Treatment may vary due to age or medical condition  Airway Management Planned: LMA  Additional Equipment:   Intra-op Plan:   Post-operative Plan: Extubation in OR  Informed Consent: I have reviewed  the patients History and Physical, chart, labs and discussed the procedure including the risks, benefits and alternatives for the proposed anesthesia  with the patient or authorized representative who has indicated his/her understanding and acceptance.     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  Last evaluation with HF cardiologist Dr. Gala Romney 10/10/22.  Echo at that time showed EF 45 to 50%.  Per note, "She is volume overloaded and hypertensive today. EF slightly down from previous. I spoke to Dr. Valentino Nose by phone and he informed me that she has been skipping HD sessions. We discussed her BP management. He will follow and if BP remains elevated we will switch losartan back to [facility]. Encouraged her to be more compliant " she is currently maintained on carvedilol 25 mg twice daily, hydralazine 100 mg 3 times daily, losartan 50 mg daily.  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  Patient will need day of surgery labs and evaluation.  EKG 10/10/2022: NSR.  Rate 77.  Possible LAE.  Nonspecific ST and T wave abnormality.  Prolonged QT (QTcB 477).  TTE 10/10/2022:  1. Left ventricular ejection fraction, by estimation, is 40 to 45%. Left  ventricular ejection fraction by 3D volume is 42 %. The left ventricle has  mildly decreased function. The left ventricle demonstrates global  hypokinesis. The left ventricular  internal cavity size was mildly dilated. There is severe concentric left  ventricular hypertrophy. Left ventricular diastolic parameters are  consistent with Grade III diastolic dysfunction (restrictive).  Elevated  left ventricular end-diastolic pressure.  The average left ventricular global longitudinal strain is -11.2 %. The  global longitudinal strain is abnormal.   2. Right ventricular systolic function is moderately reduced. The right  ventricular size is normal. There is moderately elevated pulmonary artery  systolic pressure.   3. Left atrial size was severely dilated.   4. Right atrial size was moderately dilated.   5. The mitral valve is abnormal. Trivial mitral valve regurgitation. No  evidence of mitral stenosis.   6. Tricuspid valve regurgitation is mild to moderate.   7. The aortic valve is tricuspid. Aortic valve regurgitation is trivial.  No aortic stenosis is present.   8. The inferior vena cava is dilated in size with <50% respiratory  variability, suggesting right atrial pressure of 15 mmHg.    )         Anesthesia Quick Evaluation

## 2023-03-19 NOTE — Progress Notes (Signed)
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  Last evaluation with HF cardiologist Dr. Gala Romney 10/10/22.  Echo at that time showed EF 45 to 50%.  Per note, "She is volume overloaded and hypertensive today. EF slightly down from previous. I spoke to Dr. Valentino Nose by phone and he informed me that she has been skipping HD sessions. We discussed her BP management. He will follow and if BP remains elevated we will switch losartan back to [facility]. Encouraged her to be more compliant " she is currently maintained on carvedilol 25 mg twice daily, hydralazine 100 mg 3 times daily, losartan 50 mg daily.  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  Patient will need day of surgery labs and evaluation.  EKG 10/10/2022: NSR.  Rate 77.  Possible LAE.  Nonspecific ST and T wave abnormality.  Prolonged QT (QTcB 477).  TTE 10/10/2022:  1. Left ventricular ejection fraction, by estimation, is 40 to 45%. Left  ventricular ejection fraction by 3D volume is 42 %. The left ventricle has  mildly decreased function. The left ventricle demonstrates global  hypokinesis. The left ventricular  internal cavity size was mildly dilated. There is severe concentric left  ventricular hypertrophy. Left ventricular diastolic parameters are  consistent with Grade III diastolic dysfunction (restrictive). Elevated  left ventricular end-diastolic pressure.  The average left ventricular global longitudinal strain is -11.2 %. The  global longitudinal strain is abnormal.   2. Right  ventricular systolic function is moderately reduced. The right  ventricular size is normal. There is moderately elevated pulmonary artery  systolic pressure.   3. Left atrial size was severely dilated.   4. Right atrial size was moderately dilated.   5. The mitral valve is abnormal. Trivial mitral valve regurgitation. No  evidence of mitral stenosis.   6. Tricuspid valve regurgitation is mild to moderate.   7. The aortic valve is tricuspid. Aortic valve regurgitation is trivial.  No aortic stenosis is present.   8. The inferior vena cava is dilated in size with <50% respiratory  variability, suggesting right atrial pressure of 15 mmHg.     Zannie Cove [facility] Stay [facility]/Anesthesiology Phone [phone] 03/19/2023 10:00 AM

## 2023-03-20 ENCOUNTER — Ambulatory Visit (HOSPITAL_COMMUNITY): Payer: Medicare Other

## 2023-03-20 ENCOUNTER — Ambulatory Visit (HOSPITAL_COMMUNITY): Payer: Medicare Other | Admitting: Physician Assistant

## 2023-03-20 ENCOUNTER — Encounter (HOSPITAL_COMMUNITY): Payer: Self-pay | Admitting: Surgery

## 2023-03-20 ENCOUNTER — Other Ambulatory Visit: Payer: Self-pay

## 2023-03-20 ENCOUNTER — Ambulatory Visit (HOSPITAL_BASED_OUTPATIENT_CLINIC_OR_DEPARTMENT_OTHER): Payer: Medicare Other | Admitting: Physician Assistant

## 2023-03-20 ENCOUNTER — Observation Stay (HOSPITAL_COMMUNITY)
Admission: RE | Admit: 2023-03-20 | Discharge: 2023-03-21 | Disposition: A | Discharge status: Home / Self Care | Payer: Medicare Other | Attending: Family Medicine | Admitting: Family Medicine

## 2023-03-20 ENCOUNTER — Encounter (HOSPITAL_COMMUNITY)
Admission: RE | Disposition: A | Discharge status: Home / Self Care | Payer: Self-pay | Source: Home / Self Care | Attending: Family Medicine

## 2023-03-20 ENCOUNTER — Observation Stay (HOSPITAL_COMMUNITY): Payer: Medicare Other

## 2023-03-20 DIAGNOSIS — Z79899 Other long term (current) drug therapy: Secondary | ICD-10-CM | POA: Diagnosis not present

## 2023-03-20 DIAGNOSIS — I132 Hypertensive heart and chronic kidney disease with heart failure and with stage 5 chronic kidney disease, or end stage renal disease: Secondary | ICD-10-CM

## 2023-03-20 DIAGNOSIS — T82590A Other mechanical complication of surgically created arteriovenous fistula, initial encounter: Secondary | ICD-10-CM | POA: Insufficient documentation

## 2023-03-20 DIAGNOSIS — I770 Arteriovenous fistula, acquired: Principal | ICD-10-CM | POA: Insufficient documentation

## 2023-03-20 DIAGNOSIS — Y712 Prosthetic and other implants, materials and accessory cardiovascular devices associated with adverse incidents: Secondary | ICD-10-CM | POA: Diagnosis not present

## 2023-03-20 DIAGNOSIS — E875 Hyperkalemia: Secondary | ICD-10-CM | POA: Diagnosis not present

## 2023-03-20 DIAGNOSIS — T82898A Other specified complication of vascular prosthetic devices, implants and grafts, initial encounter: Secondary | ICD-10-CM | POA: Diagnosis not present

## 2023-03-20 DIAGNOSIS — N186 End stage renal disease: Secondary | ICD-10-CM | POA: Diagnosis not present

## 2023-03-20 DIAGNOSIS — Z992 Dependence on renal dialysis: Secondary | ICD-10-CM

## 2023-03-20 DIAGNOSIS — Z940 Kidney transplant status: Secondary | ICD-10-CM | POA: Diagnosis not present

## 2023-03-20 DIAGNOSIS — I5022 Chronic systolic (congestive) heart failure: Secondary | ICD-10-CM | POA: Insufficient documentation

## 2023-03-20 DIAGNOSIS — D631 Anemia in chronic kidney disease: Secondary | ICD-10-CM | POA: Diagnosis not present

## 2023-03-20 HISTORY — PX: INSERTION OF DIALYSIS CATHETER: SHX1324

## 2023-03-20 HISTORY — PX: FISTULA SUPERFICIALIZATION: SHX6341

## 2023-03-20 LAB — POCT I-STAT, CHEM 8
BUN: 51 mg/dL — ABNORMAL HIGH (ref 6–20)
Calcium, Ion: 1.03 mmol/L — ABNORMAL LOW (ref 1.15–1.40)
Chloride: 101 mmol/L (ref 98–111)
Creatinine, Ser: 7.1 mg/dL — ABNORMAL HIGH (ref 0.44–1.00)
Glucose, Bld: 86 mg/dL (ref 70–99)
HCT: 34 % — ABNORMAL LOW (ref 36.0–46.0)
Hemoglobin: 11.6 g/dL — ABNORMAL LOW (ref 12.0–15.0)
Potassium: 6.3 mmol/L (ref 3.5–5.1)
Sodium: 138 mmol/L (ref 135–145)
TCO2: 29 mmol/L (ref 22–32)

## 2023-03-20 LAB — PREPARE RBC (CROSSMATCH)

## 2023-03-20 LAB — BASIC METABOLIC PANEL
Anion gap: 12 (ref 5–15)
Anion gap: 8 (ref 5–15)
BUN: 36 mg/dL — ABNORMAL HIGH (ref 6–20)
BUN: 37 mg/dL — ABNORMAL HIGH (ref 6–20)
CO2: 24 mmol/L (ref 22–32)
CO2: 25 mmol/L (ref 22–32)
Calcium: 8.5 mg/dL — ABNORMAL LOW (ref 8.9–10.3)
Calcium: 9.6 mg/dL (ref 8.9–10.3)
Chloride: 101 mmol/L (ref 98–111)
Chloride: 105 mmol/L (ref 98–111)
Creatinine, Ser: 6.35 mg/dL — ABNORMAL HIGH (ref 0.44–1.00)
Creatinine, Ser: 6.47 mg/dL — ABNORMAL HIGH (ref 0.44–1.00)
GFR, Estimated: 9 mL/min — ABNORMAL LOW (ref >60)
GFR, Estimated: 9 mL/min — ABNORMAL LOW (ref >60)
Glucose, Bld: 82 mg/dL (ref 70–99)
Glucose, Bld: 86 mg/dL (ref 70–99)
Potassium: 5.2 mmol/L — ABNORMAL HIGH (ref 3.5–5.1)
Potassium: 7.1 mmol/L (ref 3.5–5.1)
Sodium: 137 mmol/L (ref 135–145)
Sodium: 138 mmol/L (ref 135–145)

## 2023-03-20 LAB — HEPATITIS B SURFACE ANTIGEN: Hepatitis B Surface Ag: NONREACTIVE

## 2023-03-20 LAB — HCG, SERUM, QUALITATIVE: Preg, Serum: NEGATIVE

## 2023-03-20 LAB — POCT I-STAT EG7
Acid-Base Excess: 0 mmol/L (ref 0.0–2.0)
Bicarbonate: 26.5 mmol/L (ref 20.0–28.0)
Calcium, Ion: 1.02 mmol/L — ABNORMAL LOW (ref 1.15–1.40)
HCT: 21 % — ABNORMAL LOW (ref 36.0–46.0)
Hemoglobin: 7.1 g/dL — ABNORMAL LOW (ref 12.0–15.0)
O2 Saturation: 84 %
Potassium: 6.9 mmol/L (ref 3.5–5.1)
Sodium: 137 mmol/L (ref 135–145)
TCO2: 28 mmol/L (ref 22–32)
pCO2, Ven: 55.4 mmHg (ref 44–60)
pH, Ven: 7.288 (ref 7.25–7.43)
pO2, Ven: 55 mmHg — ABNORMAL HIGH (ref 32–45)

## 2023-03-20 SURGERY — FISTULA SUPERFICIALIZATION
Anesthesia: General | Site: Neck | Laterality: Left

## 2023-03-20 MED ORDER — OXYCODONE HCL 5 MG/5ML PO SOLN: 5.0000 mg | Freq: Once | ORAL | Status: DC | PRN

## 2023-03-20 MED ORDER — CALCIUM CHLORIDE 10 % IV SOLN
INTRAVENOUS | Status: DC | PRN
  Administered 2023-03-20: 1 g via INTRAVENOUS

## 2023-03-20 MED ORDER — CEFAZOLIN SODIUM-DEXTROSE 2-4 GM/100ML-% IV SOLN
2.0000 g | INTRAVENOUS | Status: AC
  Administered 2023-03-20: 2 g via INTRAVENOUS
  Filled 2023-03-20: qty 100

## 2023-03-20 MED ORDER — PROPOFOL 10 MG/ML IV BOLUS
INTRAVENOUS | Status: AC
  Filled 2023-03-20: qty 20

## 2023-03-20 MED ORDER — HEMOSTATIC AGENTS (NO CHARGE) OPTIME
TOPICAL | Status: DC | PRN
  Administered 2023-03-20 (×2): 1 via TOPICAL

## 2023-03-20 MED ORDER — BUPIVACAINE HCL (PF) 0.5 % IJ SOLN
INTRAMUSCULAR | Status: AC
  Filled 2023-03-20: qty 30

## 2023-03-20 MED ORDER — ACETAMINOPHEN 500 MG PO TABS
1000.0000 mg | ORAL_TABLET | Freq: Once | ORAL | Status: AC
  Administered 2023-03-20: 1000 mg via ORAL
  Filled 2023-03-20: qty 2

## 2023-03-20 MED ORDER — SODIUM CHLORIDE FLUSH 0.9 % IV SOLN: INTRAVENOUS | Status: DC | PRN

## 2023-03-20 MED ORDER — OXYCODONE HCL 5 MG PO TABS: 5.0000 mg | ORAL_TABLET | Freq: Once | ORAL | Status: DC | PRN

## 2023-03-20 MED ORDER — PHENYLEPHRINE HCL-NACL 20-0.9 MG/250ML-% IV SOLN
INTRAVENOUS | Status: DC | PRN
  Administered 2023-03-20: 50 ug/min via INTRAVENOUS

## 2023-03-20 MED ORDER — LIDOCAINE 2% (20 MG/ML) 5 ML SYRINGE
INTRAMUSCULAR | Status: DC | PRN
  Administered 2023-03-20: 60 mg via INTRAVENOUS

## 2023-03-20 MED ORDER — HYDROMORPHONE HCL 1 MG/ML IJ SOLN
INTRAMUSCULAR | Status: AC
  Filled 2023-03-20: qty 0.5

## 2023-03-20 MED ORDER — FENTANYL CITRATE (PF) 250 MCG/5ML IJ SOLN
INTRAMUSCULAR | Status: DC | PRN
  Administered 2023-03-20 (×3): 50 ug via INTRAVENOUS
  Administered 2023-03-20: 25 ug via INTRAVENOUS
  Administered 2023-03-20: 50 ug via INTRAVENOUS
  Administered 2023-03-20: 25 ug via INTRAVENOUS

## 2023-03-20 MED ORDER — INSULIN ASPART 100 UNIT/ML IV SOLN
10.0000 [IU] | Freq: Once | INTRAVENOUS | Status: AC
  Administered 2023-03-20: 10 [IU] via INTRAVENOUS

## 2023-03-20 MED ORDER — ONDANSETRON HCL 4 MG/2ML IJ SOLN
INTRAMUSCULAR | Status: DC | PRN
  Administered 2023-03-20: 4 mg via INTRAVENOUS

## 2023-03-20 MED ORDER — SODIUM CHLORIDE 0.9 % IV SOLN: INTRAVENOUS | Status: DC

## 2023-03-20 MED ORDER — SODIUM CHLORIDE 0.9 % IV SOLN: 10.0000 mL/h | Freq: Once | INTRAVENOUS | Status: DC

## 2023-03-20 MED ORDER — SODIUM BICARBONATE 8.4 % IV SOLN
50.0000 meq | Freq: Once | INTRAVENOUS | Status: AC
  Administered 2023-03-20: 50 meq via INTRAVENOUS

## 2023-03-20 MED ORDER — GUAIFENESIN-DM 100-10 MG/5ML PO SYRP: 15.0000 mL | ORAL_SOLUTION | ORAL | Status: DC | PRN

## 2023-03-20 MED ORDER — POTASSIUM CHLORIDE CRYS ER 20 MEQ PO TBCR: 20.0000 meq | EXTENDED_RELEASE_TABLET | Freq: Once | ORAL | Status: DC

## 2023-03-20 MED ORDER — HYDRALAZINE HCL 50 MG PO TABS
100.0000 mg | ORAL_TABLET | Freq: Three times a day (TID) | ORAL | Status: DC
  Administered 2023-03-20 – 2023-03-21 (×2): 100 mg via ORAL
  Filled 2023-03-20 (×2): qty 2

## 2023-03-20 MED ORDER — DEXAMETHASONE SODIUM PHOSPHATE 10 MG/ML IJ SOLN
INTRAMUSCULAR | Status: AC
  Filled 2023-03-20: qty 1

## 2023-03-20 MED ORDER — HEPARIN 6000 UNIT IRRIGATION SOLUTION
Status: AC
  Filled 2023-03-20: qty 500

## 2023-03-20 MED ORDER — DEXTROSE 50 % IV SOLN
INTRAVENOUS | Status: AC
  Filled 2023-03-20: qty 50

## 2023-03-20 MED ORDER — BUPIVACAINE LIPOSOME 1.3 % IJ SUSP
INTRAMUSCULAR | Status: AC
  Filled 2023-03-20: qty 20

## 2023-03-20 MED ORDER — CARVEDILOL 25 MG PO TABS
25.0000 mg | ORAL_TABLET | Freq: Two times a day (BID) | ORAL | Status: DC
  Administered 2023-03-20 – 2023-03-21 (×2): 25 mg via ORAL
  Filled 2023-03-20 (×2): qty 1

## 2023-03-20 MED ORDER — HYDROMORPHONE HCL 1 MG/ML IJ SOLN
0.5000 mg | INTRAMUSCULAR | Status: DC | PRN
  Administered 2023-03-20: 1 mg via INTRAVENOUS
  Filled 2023-03-20: qty 1

## 2023-03-20 MED ORDER — MIDAZOLAM HCL 2 MG/2ML IJ SOLN
INTRAMUSCULAR | Status: DC | PRN
  Administered 2023-03-20: 2 mg via INTRAVENOUS

## 2023-03-20 MED ORDER — CEFAZOLIN SODIUM-DEXTROSE 2-4 GM/100ML-% IV SOLN
2.0000 g | Freq: Three times a day (TID) | INTRAVENOUS | Status: DC
  Filled 2023-03-20: qty 100

## 2023-03-20 MED ORDER — LOSARTAN POTASSIUM 50 MG PO TABS
50.0000 mg | ORAL_TABLET | Freq: Every day | ORAL | Status: DC
  Administered 2023-03-21: 50 mg via ORAL
  Filled 2023-03-20: qty 1

## 2023-03-20 MED ORDER — ALBUMIN HUMAN 5 % IV SOLN: INTRAVENOUS | Status: DC | PRN

## 2023-03-20 MED ORDER — CHLORHEXIDINE GLUCONATE 0.12 % MT SOLN: 15.0000 mL | Freq: Once | OROMUCOSAL | Status: DC

## 2023-03-20 MED ORDER — LIDOCAINE 2% (20 MG/ML) 5 ML SYRINGE
INTRAMUSCULAR | Status: AC
  Filled 2023-03-20: qty 5

## 2023-03-20 MED ORDER — 0.9 % SODIUM CHLORIDE (POUR BTL) OPTIME
TOPICAL | Status: DC | PRN
  Administered 2023-03-20: 1000 mL

## 2023-03-20 MED ORDER — FENTANYL CITRATE (PF) 100 MCG/2ML IJ SOLN: 25.0000 ug | INTRAMUSCULAR | Status: DC | PRN

## 2023-03-20 MED ORDER — HEPARIN SODIUM (PORCINE) 1000 UNIT/ML IJ SOLN
INTRAMUSCULAR | Status: DC | PRN
  Administered 2023-03-20: 1000 [IU] via INTRAVENOUS
  Administered 2023-03-20: 6000 [IU] via INTRAVENOUS

## 2023-03-20 MED ORDER — CEFAZOLIN SODIUM-DEXTROSE 2-4 GM/100ML-% IV SOLN: 2.0000 g | Freq: Three times a day (TID) | INTRAVENOUS | Status: DC

## 2023-03-20 MED ORDER — PROPOFOL 10 MG/ML IV BOLUS
INTRAVENOUS | Status: DC | PRN
  Administered 2023-03-20: 200 mg via INTRAVENOUS

## 2023-03-20 MED ORDER — HEPARIN SODIUM (PORCINE) 1000 UNIT/ML IJ SOLN
INTRAMUSCULAR | Status: AC
  Filled 2023-03-20: qty 10

## 2023-03-20 MED ORDER — SODIUM BICARBONATE 8.4 % IV SOLN
INTRAVENOUS | Status: AC
  Filled 2023-03-20: qty 50

## 2023-03-20 MED ORDER — PROTAMINE SULFATE 10 MG/ML IV SOLN
INTRAVENOUS | Status: DC | PRN
  Administered 2023-03-20: 10 mg via INTRAVENOUS
  Administered 2023-03-20: 20 mg via INTRAVENOUS
  Administered 2023-03-20 (×2): 10 mg via INTRAVENOUS

## 2023-03-20 MED ORDER — ONDANSETRON HCL 4 MG/2ML IJ SOLN
INTRAMUSCULAR | Status: AC
  Filled 2023-03-20: qty 2

## 2023-03-20 MED ORDER — PROMETHAZINE HCL 25 MG/ML IJ SOLN: 6.2500 mg | INTRAMUSCULAR | Status: DC | PRN

## 2023-03-20 MED ORDER — DEXAMETHASONE SODIUM PHOSPHATE 10 MG/ML IJ SOLN
INTRAMUSCULAR | Status: DC | PRN
  Administered 2023-03-20: 10 mg via INTRAVENOUS

## 2023-03-20 MED ORDER — HYDRALAZINE HCL 20 MG/ML IJ SOLN: 5.0000 mg | INTRAMUSCULAR | Status: DC | PRN

## 2023-03-20 MED ORDER — MIDAZOLAM HCL 2 MG/2ML IJ SOLN
INTRAMUSCULAR | Status: AC
  Filled 2023-03-20: qty 2

## 2023-03-20 MED ORDER — PANTOPRAZOLE SODIUM 40 MG PO TBEC
40.0000 mg | DELAYED_RELEASE_TABLET | Freq: Every day | ORAL | Status: DC
  Administered 2023-03-21: 40 mg via ORAL
  Filled 2023-03-20: qty 1

## 2023-03-20 MED ORDER — HYDROMORPHONE HCL 1 MG/ML IJ SOLN
INTRAMUSCULAR | Status: DC | PRN
  Administered 2023-03-20 (×2): .5 mg via INTRAVENOUS

## 2023-03-20 MED ORDER — ACETAMINOPHEN 650 MG RE SUPP: 325.0000 mg | RECTAL | Status: DC | PRN

## 2023-03-20 MED ORDER — ACETAMINOPHEN 325 MG PO TABS: 325.0000 mg | ORAL_TABLET | ORAL | Status: DC | PRN

## 2023-03-20 MED ORDER — PHENYLEPHRINE 80 MCG/ML (10ML) SYRINGE FOR IV PUSH (FOR BLOOD PRESSURE SUPPORT)
PREFILLED_SYRINGE | INTRAVENOUS | Status: DC | PRN
  Administered 2023-03-20: 160 ug via INTRAVENOUS
  Administered 2023-03-20: 80 ug via INTRAVENOUS
  Administered 2023-03-20 (×2): 160 ug via INTRAVENOUS

## 2023-03-20 MED ORDER — HEPARIN 6000 UNIT IRRIGATION SOLUTION
Status: DC | PRN
  Administered 2023-03-20: 1

## 2023-03-20 MED ORDER — PHENOL 1.4 % MT LIQD: 1.0000 | OROMUCOSAL | Status: DC | PRN

## 2023-03-20 MED ORDER — ALUM & MAG HYDROXIDE-SIMETH 200-200-20 MG/5ML PO SUSP: 15.0000 mL | ORAL | Status: DC | PRN

## 2023-03-20 MED ORDER — EPHEDRINE SULFATE-NACL 50-0.9 MG/10ML-% IV SOSY
PREFILLED_SYRINGE | INTRAVENOUS | Status: DC | PRN
  Administered 2023-03-20: 10 mg via INTRAVENOUS
  Administered 2023-03-20: 15 mg via INTRAVENOUS

## 2023-03-20 MED ORDER — OXYCODONE-ACETAMINOPHEN 5-325 MG PO TABS
1.0000 | ORAL_TABLET | ORAL | Status: DC | PRN
  Administered 2023-03-21 (×2): 2 via ORAL
  Filled 2023-03-20 (×2): qty 2

## 2023-03-20 MED ORDER — DIPHENHYDRAMINE HCL 50 MG/ML IJ SOLN
INTRAMUSCULAR | Status: DC | PRN
  Administered 2023-03-20: 25 mg via INTRAVENOUS

## 2023-03-20 MED ORDER — HEPARIN SODIUM (PORCINE) 1000 UNIT/ML IJ SOLN
INTRAMUSCULAR | Status: DC | PRN
  Administered 2023-03-20: 1000 [IU] via INTRAVENOUS

## 2023-03-20 MED ORDER — MEPERIDINE HCL 25 MG/ML IJ SOLN: 6.2500 mg | INTRAMUSCULAR | Status: DC | PRN

## 2023-03-20 MED ORDER — AMLODIPINE BESYLATE 10 MG PO TABS
10.0000 mg | ORAL_TABLET | Freq: Every day | ORAL | Status: DC
  Administered 2023-03-21: 10 mg via ORAL
  Filled 2023-03-20: qty 1

## 2023-03-20 MED ORDER — ORAL CARE MOUTH RINSE
15.0000 mL | Freq: Once | OROMUCOSAL | Status: AC
  Administered 2023-03-20: 15 mL via OROMUCOSAL

## 2023-03-20 MED ORDER — AMISULPRIDE (ANTIEMETIC) 5 MG/2ML IV SOLN: 10.0000 mg | Freq: Once | INTRAVENOUS | Status: DC | PRN

## 2023-03-20 MED ORDER — EPHEDRINE 5 MG/ML INJ
INTRAVENOUS | Status: AC
  Filled 2023-03-20: qty 5

## 2023-03-20 MED ORDER — FENTANYL CITRATE (PF) 250 MCG/5ML IJ SOLN
INTRAMUSCULAR | Status: AC
  Filled 2023-03-20: qty 5

## 2023-03-20 MED ORDER — LABETALOL HCL 5 MG/ML IV SOLN: 10.0000 mg | INTRAVENOUS | Status: DC | PRN

## 2023-03-20 MED ORDER — METOPROLOL TARTRATE 5 MG/5ML IV SOLN: 2.0000 mg | INTRAVENOUS | Status: DC | PRN

## 2023-03-20 MED ORDER — DEXTROSE 50 % IV SOLN
50.0000 mL | Freq: Once | INTRAVENOUS | Status: AC
  Administered 2023-03-20: 50 mL via INTRAVENOUS

## 2023-03-20 MED ORDER — LIDOCAINE-EPINEPHRINE (PF) 1 %-1:200000 IJ SOLN
INTRAMUSCULAR | Status: AC
  Filled 2023-03-20: qty 30

## 2023-03-20 SURGICAL SUPPLY — 60 items
ADH SKN CLS APL DERMABOND .7 (GAUZE/BANDAGES/DRESSINGS) ×2
AGENT HMST MTR 8 SURGIFLO (HEMOSTASIS) ×4
ARMBAND PINK RESTRICT EXTREMIT (MISCELLANEOUS) ×3 IMPLANT
BAG BANDED W/RUBBER/TAPE 36X54 (MISCELLANEOUS) IMPLANT
BAG COUNTER SPONGE SURGICOUNT (BAG) ×3 IMPLANT
BAG EQP BAND 135X91 W/RBR TAPE (MISCELLANEOUS) ×2
BAG SPNG CNTER NS LX DISP (BAG) ×2
BIOPATCH RED 1 DISK 7.0 (GAUZE/BANDAGES/DRESSINGS) IMPLANT
BNDG CMPR 5X4 KNIT ELC UNQ LF (GAUZE/BANDAGES/DRESSINGS) ×2
BNDG CMPR 5X62 HK CLSR LF (GAUZE/BANDAGES/DRESSINGS) ×2
BNDG CMPR 6 X 5 YARDS HK CLSR (GAUZE/BANDAGES/DRESSINGS) ×2
BNDG CMPR 6"X 5 YARDS HK CLSR (GAUZE/BANDAGES/DRESSINGS) ×2
BNDG ELASTIC 4INX 5YD STR LF (GAUZE/BANDAGES/DRESSINGS) IMPLANT
BNDG ELASTIC 6INX 5YD STR LF (GAUZE/BANDAGES/DRESSINGS) IMPLANT
BNDG GAUZE DERMACEA FLUFF 4 (GAUZE/BANDAGES/DRESSINGS) IMPLANT
BNDG GZE DERMACEA 4 6PLY (GAUZE/BANDAGES/DRESSINGS) ×2
CANISTER SUCT 3000ML PPV (MISCELLANEOUS) ×3 IMPLANT
CATH PALINDROME-P 19CM W/VT (CATHETERS) IMPLANT
CATH PALINDROME-P 23CM W/VT (CATHETERS) IMPLANT
CLIP TI MEDIUM 24 (CLIP) IMPLANT
CLIP TI MEDIUM 6 (CLIP) ×3 IMPLANT
CLIP TI WIDE RED SMALL 6 (CLIP) ×3 IMPLANT
COVER DOME SNAP 22 D (MISCELLANEOUS) IMPLANT
COVER PROBE W GEL 5X96 (DRAPES) ×3 IMPLANT
DERMABOND ADVANCED .7 DNX12 (GAUZE/BANDAGES/DRESSINGS) ×3 IMPLANT
DRAPE ORTHO SPLIT 77X108 STRL (DRAPES) ×2
DRAPE SURG ORHT 6 SPLT 77X108 (DRAPES) IMPLANT
ELECT REM PT RETURN 9FT ADLT (ELECTROSURGICAL) ×2
ELECTRODE REM PT RTRN 9FT ADLT (ELECTROSURGICAL) ×3 IMPLANT
GAUZE PAD ABD 8X10 STRL (GAUZE/BANDAGES/DRESSINGS) IMPLANT
GLOVE SURG SS PI 7.5 STRL IVOR (GLOVE) ×9 IMPLANT
GOWN STRL REUS W/ TWL LRG LVL3 (GOWN DISPOSABLE) ×6 IMPLANT
GOWN STRL REUS W/ TWL XL LVL3 (GOWN DISPOSABLE) ×3 IMPLANT
GOWN STRL REUS W/TWL LRG LVL3 (GOWN DISPOSABLE) ×4
GOWN STRL REUS W/TWL XL LVL3 (GOWN DISPOSABLE) ×2
HEMOSTAT SNOW SURGICEL 2X4 (HEMOSTASIS) IMPLANT
KIT BASIN OR (CUSTOM PROCEDURE TRAY) ×3 IMPLANT
KIT MICROPUNCTURE NIT STIFF (SHEATH) IMPLANT
KIT TURNOVER KIT B (KITS) ×3 IMPLANT
MARKER SKIN DUAL TIP RULER LAB (MISCELLANEOUS) IMPLANT
NS IRRIG 1000ML POUR BTL (IV SOLUTION) ×3 IMPLANT
PACK CV ACCESS (CUSTOM PROCEDURE TRAY) ×3 IMPLANT
PAD ARMBOARD 7.5X6 YLW CONV (MISCELLANEOUS) ×6 IMPLANT
PENCIL BUTTON HOLSTER BLD 10FT (ELECTRODE) IMPLANT
SLING ARM FOAM STRAP LRG (SOFTGOODS) IMPLANT
SLING ARM FOAM STRAP MED (SOFTGOODS) IMPLANT
SPONGE SURGIFLO 8M (HEMOSTASIS) IMPLANT
SPONGE T-LAP 18X18 ~~LOC~~+RFID (SPONGE) IMPLANT
SUT ETHILON 3 0 PS 1 (SUTURE) IMPLANT
SUT ETHILON 4 0 PS 2 18 (SUTURE) IMPLANT
SUT PROLENE 5 0 C 1 24 (SUTURE) IMPLANT
SUT PROLENE 6 0 BV (SUTURE) IMPLANT
SUT PROLENE 6 0 CC (SUTURE) ×3 IMPLANT
SUT VIC AB 3-0 SH 27 (SUTURE) ×10
SUT VIC AB 3-0 SH 27X BRD (SUTURE) ×3 IMPLANT
SUT VIC AB 4-0 PS2 18 (SUTURE) IMPLANT
SUT VICRYL 4-0 PS2 18IN ABS (SUTURE) IMPLANT
TOWEL GREEN STERILE (TOWEL DISPOSABLE) ×3 IMPLANT
UNDERPAD 30X36 HEAVY ABSORB (UNDERPADS AND DIAPERS) ×3 IMPLANT
WATER STERILE IRR 1000ML POUR (IV SOLUTION) ×3 IMPLANT

## 2023-03-20 NOTE — Anesthesia Procedure Notes (Signed)
Procedure Name: LMA Insertion Date/Time: 03/20/2023 10:15 AM  Performed by: Rosiland Oz, CRNAPre-anesthesia Checklist: Patient identified, Emergency Drugs available, Suction available, Patient being monitored and Timeout performed Patient Re-evaluated:Patient Re-evaluated prior to induction Oxygen Delivery Method: Circle system utilized Preoxygenation: Pre-oxygenation with 100% oxygen Induction Type: IV induction LMA: LMA inserted LMA Size: 3.0 Number of attempts: 1 Placement Confirmation: positive ETCO2 and breath sounds checked- equal and bilateral Tube secured with: Tape Dental Injury: Teeth and Oropharynx as per pre-operative assessment

## 2023-03-20 NOTE — Progress Notes (Addendum)
Received patient  from Pacu nurses,report given at bedside.Awake,alert and oriented x 4.Patient gave verbal consent at witnessed by another HD nurse.She also gave verbal consent to received blood transfusion as witnessed by another HD nurse.  Access used : Newly placed inserted Left HD catheter that worked well.  Duration of treatment : 3.5 hours .  Fluid UF goal of 1 liter prescribed.  Hand off to the Night HD nurse.

## 2023-03-20 NOTE — Anesthesia Postprocedure Evaluation (Signed)
Anesthesia Post Note  Patient: Krystall Kruckenberg  Procedure(s) Performed: LEFT ARM FISTULA PLICATION (Left) INSERTION OF DIALYSIS CATHETER USING 23cm PALINDROME PRECISION CHRONIC CATHETER (Left: Neck)     Patient location during evaluation: PACU Anesthesia Type: General Level of consciousness: awake and alert Pain management: pain level controlled Vital Signs Assessment: post-procedure vital signs reviewed and stable Respiratory status: spontaneous breathing, nonlabored ventilation, respiratory function stable and patient connected to nasal cannula oxygen Cardiovascular status: blood pressure returned to baseline and stable Postop Assessment: no apparent nausea or vomiting Anesthetic complications: no  No notable events documented.  Last Vitals:  Vitals:   03/20/23 1530 03/20/23 1545  BP: 131/78   Pulse: 73   Resp: 15 16  Temp:  37.1 C  SpO2: 99%     Last Pain:  Vitals:   03/20/23 1545  TempSrc:   PainSc: 2                  Kennieth Rad

## 2023-03-20 NOTE — Consult Note (Signed)
Renal Service Consult Note [facility]  [place] 03/20/2023 Dominique Krabbe, MD Requesting Physician: Dr. Myra Gianotti   Reason for Consult: ESRD pt sp AVF surgery w/ hyperkalemia HPI: The patient is a 22 y.o. year-old w/ PMH as below who presented for skin thinning over her AVF. Venogram showed no stenosis. Pt underwent large plication of the AVF today w/ TDC placement. Pre-op K+ was high at 5.2- 6.3 and post op it went up to 7.1.  In OR she may have rec'd IV Ca. In PACU she rec'd IV insulin/ glucose and IV bicarb. EKG showed QRS w/o widening (80 msec).  Pt is to be admitted. We are asked to see for dialysis.   Pt seen in PACU, still groggy postop, no hx obtained.     ROS - n/a   Past Medical History  Past Medical History:  Diagnosis Date   Alport syndrome    Anemia    Chronic systolic CHF (congestive heart failure) (HCC)    Constipation 12/18/2013   history - no current problems as of 03/18/23   Depression 07/28/2013   ESRD (end stage renal disease) (HCC)     Transplant failed Hemo MWF at [facility].   Hearing loss    Bilateral hearing aids   History of blood transfusion    History of blood transfusion    Hypertension    Influenza B    S/P wisdom tooth extraction 06/22/2016   Right mandibular dental pain   Sinus tachycardia    Wears glasses    Past Surgical History  Past Surgical History:  Procedure Laterality Date   A/V FISTULAGRAM Left 03/04/2023   Procedure: A/V Fistulagram;  Surgeon: Nada Libman, MD;  Location: [facility] INVASIVE CV LAB;  Service: Cardiovascular;  Laterality: Left;   AV FISTULA PLACEMENT Left 02/10/2020   Procedure: ARTERIOVENOUS (AV) FISTULA CREATION;  Surgeon: Nada Libman, MD;  Location: [facility] OR;  Service: Vascular;  Laterality: Left;   CENTRAL VENOUS CATHETER INSERTION  10/08/2019   Procedure: Insertion Central Line Adult;  Surgeon: Donata Clay, Theron Arista, MD;  Location: [facility] OR;  Service: Thoracic;;    Hemodialysis catheter placed Right    IR REMOVAL TUN CV CATH W/O FL  06/15/2020   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   NEPHRECTOMY Right    PARATHYROIDECTOMY  07/17/2020   TOTAL PARATHYROIDECTOMY WITH AUTOTRANSPLANT (N/A   PARATHYROIDECTOMY Left 07/17/2020   Procedure: TOTAL PARATHYROIDECTOMY WITH AUTOTRANSPLANT;  Surgeon: Darnell Level, MD;  Location: [facility] OR;  Service: General;  Laterality: Left;  Left arm is location of autoransplant   RIGHT/LEFT HEART CATH AND CORONARY ANGIOGRAPHY N/A 10/07/2019   Procedure: RIGHT/LEFT HEART CATH AND CORONARY ANGIOGRAPHY;  Surgeon: Dolores Patty, MD;  Location: [facility] INVASIVE CV LAB;  Service: Cardiovascular;  Laterality: N/A;   SUBXYPHOID PERICARDIAL WINDOW N/A 10/08/2019   Procedure: SUBXYPHOID PERICARDIAL WINDOW;  Surgeon: Kerin Perna, MD;  Location: [facility] OR;  Service: Thoracic;  Laterality: N/A;   TEE WITHOUT CARDIOVERSION N/A 10/08/2019   Procedure: TRANSESOPHAGEAL ECHOCARDIOGRAM (TEE);  Surgeon: Donata Clay, Theron Arista, MD;  Location: [facility] OR;  Service: Thoracic;  Laterality: N/A;   WISDOM TOOTH EXTRACTION     Family History  Family History  Problem Relation Age of Onset   Alport syndrome Brother    Social History  reports that she has never smoked. She has never used smokeless tobacco. She reports that she does not drink alcohol and does not use drugs. Allergies  Allergies  Allergen Reactions  Enalapril Other (See Comments)    AKI   Chlorhexidine Gluconate Hives   Ibuprofen Other (See Comments)    Pt not able to take ibuprofen due to Alport's syndrome   Nsaids Other (See Comments)    Other reaction(s): Unknown  AKI   Home medications Prior to Admission medications   Medication Sig Start Date End Date Taking? Authorizing Provider  acetaminophen (TYLENOL) 500 MG tablet Take 500 mg by mouth every 6 (six) hours as needed for moderate pain or headache.   Yes [provider]  amLODipine (NORVASC) 10 MG tablet Take 1 tablet (10 mg total) by mouth  daily. Please make overdue appt with Dr. Eden Emms before anymore refills. Thank you 2nd attempt 09/18/21  Yes Wendall Stade, MD  carvedilol (COREG) 25 MG tablet TAKE 1 TABLET(25 MG) BY MOUTH TWICE DAILY WITH A MEAL 01/26/20  Yes Lovett Sox, MD  hydrALAZINE (APRESOLINE) 100 MG tablet Take 100 mg by mouth 3 (three) times daily. 07/14/20  Yes [provider]  losartan (COZAAR) 25 MG tablet Take 50 mg by mouth at bedtime.   Yes [provider]  ondansetron (ZOFRAN) 4 MG tablet Take 4 mg by mouth every 8 (eight) hours as needed for nausea or vomiting. 01/20/23  Yes [provider]  megestrol (MEGACE) 40 MG tablet Take 1 tablet (40 mg total) by mouth daily. Patient not taking: Reported on 03/17/2023 02/25/23   Adam Phenix, MD     Vitals:   03/18/23 1110 03/20/23 0821 03/20/23 0850 03/20/23 1451  BP:  (!) 168/100 (!) 176/103 (!) 136/90  Pulse:  95  65  Resp:  20  15  Temp:  98.1 F (36.7 C)  98.7 F (37.1 C)  TempSrc:  Oral    SpO2:  96%  100%  Weight: 54.4 kg 54.4 kg    Height: 5\' 4"  (1.626 m) 5\' 4"  (1.626 m)     Exam Gen post op somnolent, no distress No rash, cyanosis or gangrene Sclera anicteric, throat clear  No jvd or bruits Chest clear bilat to bases, no rales/ wheezing RRR no MRG Abd soft ntnd no mass or ascites +bs GU defer MS no joint effusions or deformity Ext no LE or UE edema, no wounds or ulcers Neuro is alert, Ox 3 , nf    L AVF wrapped post op/ new LIJ TDC in place   Home meds include - norvasc, coreg, hydralazine, losartan, megace     [facility] HD[facility]  3h  350/1.5   55.5kg   2K/3Ca bath  LIJ TDC (new)/ AVF plicated today - last OP HD 7 /10, post wt 56.6kg   - hectorol 2 mcg IV three times per week - venofer 50mg  weekly IV - mircera 120 mcg IV q 2 wks, last 7/08, due 7/22  Assessment/ Plan: Hyperkalemia - K+ 7.1 in OR today, was lower pre-op but still up (5-6 range). Fortunately there are no EKG changes. She rec'd IV insulin,  glucose and bicarb. Plan is for dialysis as soon as possible upstairs.  ESRD - on HD MWF. Plan as above. HD off schedule tonight.  HTN/ volume - BP's up a bit, no vol excess on exam. UF 1-2 L, and cont home meds when awake enough to take pills.  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 Follow, transfuse prn.  MBD ckd - Ca in range, add on phos and albumin.  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

## 2023-03-20 NOTE — Progress Notes (Signed)
Due to her ESRD, she will not need further post op cefazolin as pre-op dose will provide 24 hrs.  Ulyses Southward, PharmD, BCIDP, AAHIVP, CPP Infectious Disease Pharmacist 03/20/2023 10:16 PM

## 2023-03-20 NOTE — Transfer of Care (Signed)
Immediate Anesthesia Transfer of Care Note  Patient: Dominique Lawrence  Procedure(s) Performed: LEFT ARM FISTULA PLICATION (Left) INSERTION OF DIALYSIS CATHETER USING 23cm PALINDROME PRECISION CHRONIC CATHETER (Left: Neck)  Patient Location: PACU  Anesthesia Type:General  Level of Consciousness: drowsy and patient cooperative  Airway & Oxygen Therapy: Patient Spontanous Breathing  Post-op Assessment: Report given to RN and Post -op Vital signs reviewed and stable  Post vital signs: Reviewed and stable  Last Vitals:  Vitals Value Taken Time  BP 136/90 03/20/23 1451  Temp    Pulse 66 03/20/23 1455  Resp 14 03/20/23 1455  SpO2 100 % 03/20/23 1455  Vitals shown include unfiled device data.  Last Pain:  Vitals:   03/20/23 0828  TempSrc:   PainSc: 0-No pain         Complications: No notable events documented.

## 2023-03-20 NOTE — Progress Notes (Signed)
CRITICAL RESULT PROVIDER NOTIFICATION  Test performed and critical result:  ISTAT - K+: 6.3  Date and time result received:  03/20/23 @ 0847  Provider name/title: Dr. Renold Don  Date and time provider notified: 03/20/23 @ 0852  Date and time provider responded: 03/20/23 @ 4098  Provider response: BMP ordered

## 2023-03-20 NOTE — H&P (Signed)
   Patient name: Dominique Lawrence MRN: [id number] DOB: [birth year] Sex: female    HISTORY OF PRESENT ILLNESS:   Dominique Lawrence is a 22 y.o. female with a left UA AVF that is aneurysmal and has skinn thinning.  Her venogram did not show any stenosis.  She is here for plication  CURRENT MEDICATIONS:    Current Facility-Administered Medications  Medication Dose Route Frequency Provider Last Rate Last Admin   0.9 %  sodium chloride infusion   Intravenous Continuous Nada Libman, MD 10 mL/hr at 03/20/23 0849 New Bag at 03/20/23 0849   ceFAZolin (ANCEF) IVPB 2g/100 mL premix  2 g Intravenous 30 min Pre-Op Nada Libman, MD        REVIEW OF SYSTEMS:   [X]  denotes positive finding, [ ]  denotes negative finding Cardiac  Comments:  Chest pain or chest pressure:    Shortness of breath upon exertion:    Short of breath when lying flat:    Irregular heart rhythm:    Constitutional    Fever or chills:      PHYSICAL EXAM:   Vitals:   03/18/23 1110 03/20/23 0821 03/20/23 0850  BP:  (!) 168/100 (!) 176/103  Pulse:  95   Resp:  20   Temp:  98.1 F (36.7 C)   TempSrc:  Oral   SpO2:  96%   Weight: 54.4 kg 54.4 kg   Height: 5\' 4"  (1.626 m) 5\' 4"  (1.626 m)     GENERAL: The patient is a well-nourished female, in no acute distress. The vital signs are documented above. CARDIOVASCULAR: There is a regular rate and rhythm. PULMONARY: Non-labored respirations   STUDIES:      MEDICAL ISSUES:   Aneurysmal left UE AVF:  We discussed plication of her aneurysms.  In order to do this, she will need a catheter.  All questions answered  Charlena Cross, MD, FACS [facility] [phone] Pager [phone]

## 2023-03-20 NOTE — Op Note (Signed)
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  Resection of redundant fistula, branch ligation, and plication   #2: Ultrasound-guided placement of left internal jugular vein tunneled dialysis catheter using fluoroscopic guidance Anesthesia:  General Blood Loss:  See Anesthesia record Specimens:  none  Findings: Aneurysmal fistula from the antecubital crease all the way up into the shoulder  Indications: This is a 22 year old female with end-stage renal disease.  She has a aneurysmal fistula throughout her upper arm with skin thinning and ulceration.  She comes in today for revision of her fistula.  She will need a catheter while her fistula heals  Procedure:  The patient was identified in the holding area and taken to [facility] OR ROOM 16  The patient was then placed supine on the table. general anesthesia was administered.  The patient was prepped and draped in the usual sterile fashion.  A time out was called and antibiotics were administered.  The PA was necessary to expedite the procedure and assist with technical details.  He helped with exposure by providing suction and retraction.  He helped with the plication by following the sutures.  He help with wound closure.  Ultrasound was used to evaluate the veins in the right neck.  I could not identify an obvious internal jugular vein.  There were multiple collaterals.  On the left side I did see a jugular vein and so I cannulated this under ultrasound guidance with a micropuncture needle.  A 018 wire was then inserted into the central venous system under fluoroscopic guidance.  A micropuncture sheath was then placed followed by an 035 wire.  The subcutaneous tract was dilated with sequential  dilators and the peel-away sheath was placed.  A skin exit site was selected below the clavicle.  A skin nick was made.  A tunnel was then created between the 2 incisions and a 23 cm catheter was brought through the tunnel and advanced through the peel-away sheath which was removed.  Fluoroscopy confirmed that the catheter tip was at the cavoatrial junction and that there were no kinks within the catheter.  Both ports flushed and aspirated without difficulty.  The catheter was sutured into position with 3-0 nylon.  There was bleeding from the neck incision and so I closed this with two 3-0 nylon suture.  Attention was then turned towards the fistula.  I made 3 incisions over top of the aneurysmal fistula.  The fistula was fully mobilized from the antecubital crease up to just under the clavicle.  It was aneurysmal with multiple branches that were ligated.  There were also several redundant areas.  Once I had the vein fully mobilized, the patient was systemically heparinized.  I then occluded the fistula.  I initially began in the incision up by the clavicle.  The fistula was transected and I excised the redundant portion.  I sewed the back wall together and then plicated the anterior layer.  Once this was done, I felt that there was some narrowing within the vein and so I used a vein patch to make sure that there was no narrowing.  Once this was completed, I turned my attention towards the upper arm.  The fistula was opened from the antecubital crease up to the axilla.  I resected the  anterior wall, about 50%.  I also transected the fistula and resected about 2 cm of redundant fistula.  The back wall was then sewn back together with 5-0 Prolene and then I completed plication of the fistula with a running 5-0 Prolene.  Prior to completion the appropriate flushing maneuvers were performed.  The PA help follow the suture for the anastomoses.  Blood flow was then reestablished to the fistula.  There was an excellent  thrill within the fistula.  Multiple repair sutures were required for hemostasis.  We did lose a fair amount of blood during the procedure and so the patient will be transfused.  I then reversed the heparin with 50 mg of protamine.  Hemostasis was achieved.  The arm incision was closed by reapproximating the subcutaneous tissue with running 3-0 Vicryl.  The skin was closed with a running 3-0 nylon.  The infraclavicular incision was closed by reapproximating the subcutaneous tissue with a running 3-0 Vicryl.  The skin was closed with a 4-0 Monocryl.  Dermabond was applied.  The patient was successfully extubated taken recovery in stable condition.   Disposition: To PACU stable.   Juleen China, M.D., [facility] [place] Office: [phone] Pager:  [phone]

## 2023-03-21 ENCOUNTER — Encounter (HOSPITAL_COMMUNITY): Payer: Self-pay | Admitting: Surgery

## 2023-03-21 ENCOUNTER — Other Ambulatory Visit (HOSPITAL_COMMUNITY): Payer: Self-pay

## 2023-03-21 DIAGNOSIS — I770 Arteriovenous fistula, acquired: Secondary | ICD-10-CM | POA: Diagnosis not present

## 2023-03-21 LAB — CBC
HCT: 25.2 % — ABNORMAL LOW (ref 36.0–46.0)
Hemoglobin: 8.2 g/dL — ABNORMAL LOW (ref 12.0–15.0)
MCH: 30.1 pg (ref 26.0–34.0)
MCHC: 32.5 g/dL (ref 30.0–36.0)
MCV: 92.6 fL (ref 80.0–100.0)
Platelets: 113 10*3/uL — ABNORMAL LOW (ref 150–400)
RBC: 2.72 MIL/uL — ABNORMAL LOW (ref 3.87–5.11)
RDW: 18.9 % — ABNORMAL HIGH (ref 11.5–15.5)
WBC: 9.7 10*3/uL (ref 4.0–10.5)
nRBC: 0 % (ref 0.0–0.2)

## 2023-03-21 LAB — BASIC METABOLIC PANEL
Anion gap: 10 (ref 5–15)
BUN: 19 mg/dL (ref 6–20)
CO2: 27 mmol/L (ref 22–32)
Calcium: 8 mg/dL — ABNORMAL LOW (ref 8.9–10.3)
Chloride: 99 mmol/L (ref 98–111)
Creatinine, Ser: 4.05 mg/dL — ABNORMAL HIGH (ref 0.44–1.00)
GFR, Estimated: 15 mL/min — ABNORMAL LOW (ref >60)
Glucose, Bld: 100 mg/dL — ABNORMAL HIGH (ref 70–99)
Potassium: 4.4 mmol/L (ref 3.5–5.1)
Sodium: 136 mmol/L (ref 135–145)

## 2023-03-21 LAB — BPAM RBC
Blood Product Expiration Date: 202408042359
Blood Product Expiration Date: 202408082359
Unit Type and Rh: 5100

## 2023-03-21 LAB — TYPE AND SCREEN
ABO/RH(D): O POS
Unit division: 0

## 2023-03-21 LAB — HEPATITIS B SURFACE ANTIBODY, QUANTITATIVE: Hep B S AB Quant (Post): 20.9 m[IU]/mL

## 2023-03-21 LAB — PREPARE RBC (CROSSMATCH)

## 2023-03-21 MED ORDER — OXYCODONE-ACETAMINOPHEN 5-325 MG PO TABS
1.0000 | ORAL_TABLET | Freq: Four times a day (QID) | ORAL | 0 refills | Status: AC | PRN
Start: 2023-03-21 — End: ?
  Filled 2023-03-21: qty 20, 5d supply, fill #0

## 2023-03-21 MED ORDER — SODIUM CHLORIDE 0.9% IV SOLUTION: Freq: Once | INTRAVENOUS | Status: AC

## 2023-03-21 NOTE — Progress Notes (Signed)
Received patient in stretcher to unit.  Alert and oriented.  Informed consent signed and in chart.   TX duration: 3.25  Patient tolerated well.  Transported back to the room  Alert, without acute distress.  Hand-off given to patient's nurse.   Access used: Catheter Access issues: none  Total UF removed: 900 ml Medication(s) given: none Post HD VS: 172/102 Post HD weight: unable to obtain    03/20/23 2130  Vitals  Temp 98.4 F (36.9 C)  Temp Source Oral  BP (!) 172/102  MAP (mmHg) 122  BP Location Right Arm  BP Method Automatic  Patient Position (if appropriate) Lying  Pulse Rate 81  Pulse Rate Source Monitor  ECG Heart Rate 82  Resp (!) 22  Oxygen Therapy  SpO2 100 %  O2 Device Room Air  O2 Flow Rate (L/min) 2 L/min  Pulse Oximetry Type Continuous  Oximetry Probe Site Changed No  Post Treatment  Dialyzer Clearance Lightly streaked  Duration of HD Treatment -hour(s) 3.29 hour(s)  Hemodialysis Intake (mL) 0 mL  Liters Processed 83.2  Fluid Removed (mL) 900 mL  Tolerated HD Treatment Yes  Post-Hemodialysis Comments HD tx achieved as scheduled, tolerated well. pt is stable.  AVG/AVF Arterial Site Held (minutes) 0 minutes  AVG/AVF Venous Site Held (minutes) 0 minutes  Note  Patient Observations pt is in stretcher, alert, oriented, verbally responsive, c/o left upper extremity pain.  Hemodialysis Catheter Left Internal jugular Double lumen Permanent (Tunneled)  Placement Date/Time: 03/20/23 1048   Serial / Lot #: [id number]  Expiration Date: 07/10/27  Time Out: Correct patient;Correct site;Correct procedure  Maximum sterile barrier precautions: Hand hygiene;Cap;Mask;Sterile gown;Sterile gloves;Large sterile s...  Site Condition No complications  Blue Lumen Status Flushed;Dead end cap in place  Red Lumen Status Flushed;Heparin locked;Dead end cap in place  Purple Lumen Status N/A  Dressing Type Gauze/Drain sponge  Dressing Status Antimicrobial disc in place   Drainage Description None  Post treatment catheter status Capped and Clamped

## 2023-03-21 NOTE — Discharge Instructions (Signed)
° °  [facility] [place] ° °Discharge Instructions ° °AV Fistula or Graft Surgery for Dialysis Access ° °Please refer to the following instructions for your post-procedure care. Your surgeon or physician assistant will discuss any changes with you. ° °Activity ° °You may drive the day following your surgery, if you are comfortable and no longer taking prescription pain medication. Resume full activity as the soreness in your incision resolves. ° °Bathing/Showering ° °You may shower after you go home. Keep your incision dry for 48 hours. Do not soak in a bathtub, hot tub, or swim until the incision heals completely. You may not shower if you have a hemodialysis catheter. ° °Incision Care ° °Clean your incision with mild soap and water after 48 hours. Pat the area dry with a clean towel. You do not need a bandage unless otherwise instructed. Do not apply any ointments or creams to your incision. You may have skin glue on your incision. Do not peel it off. It will come off on its own in about one week. Your arm may swell a bit after surgery. To reduce swelling use pillows to elevate your arm so it is above your heart. Your doctor will tell you if you need to lightly wrap your arm with an ACE bandage. ° °Diet ° °Resume your normal diet. There are not special food restrictions following this procedure. In order to heal from your surgery, it is CRITICAL to get adequate nutrition. Your body requires vitamins, minerals, and protein. Vegetables are the best source of vitamins and minerals. Vegetables also provide the perfect balance of protein. Processed food has little nutritional value, so try to avoid this. ° °Medications ° °Resume taking all of your medications. If your incision is causing pain, you may take over-the counter pain relievers such as acetaminophen (Tylenol). If you were prescribed a stronger pain medication, please be aware these medications can cause nausea and constipation. Prevent  nausea by taking the medication with a snack or meal. Avoid constipation by drinking plenty of fluids and eating foods with high amount of fiber, such as fruits, vegetables, and grains. Do not take Tylenol if you are taking prescription pain medications. ° ° ° ° °Follow up °Your surgeon may want to see you in the office following your access surgery. If so, this will be arranged at the time of your surgery. ° °Please call us immediately for any of the following conditions: ° °Increased pain, redness, drainage (pus) from your incision site °Fever of 101 degrees or higher °Severe or worsening pain at your incision site °Hand pain or numbness. ° °Reduce your risk of vascular disease: ° °Stop smoking. If you would like help, call [facility] at 1-800-QUIT-NOW (1-800-784-8669) or [facility] at 336-586-4000 ° °Manage your cholesterol °Maintain a desired weight °Control your diabetes °Keep your blood pressure down ° °Dialysis ° °It will take several weeks to several months for your new dialysis access to be ready for use. Your surgeon will determine when it is OK to use it. Your nephrologist will continue to direct your dialysis. You can continue to use your Permcath until your new access is ready for use. ° °If you have any questions, please call the office at [phone]. ° °

## 2023-03-21 NOTE — Progress Notes (Signed)
[facility] [place]  Subjective  - Numbness in the left UE, not too much pain at this time   Objective 134/88 66 98.5 F (36.9 C) (Oral) 17 100%  Intake/Output Summary (Last 24 hours) at 03/21/2023 0704 Last data filed at 03/21/2023 0645 Gross per 24 hour  Intake 2428 ml  Output 2150 ml  Net 278 ml   Left UE ace wrap intact for compression.  Dressing clean and dry. Left UE motor intact, skin warm to touch sensation decreased slightly Left TDC used 03/20/23 without issues per patient Lungs non labored breathing   Assessment/Planning: POD # 1  Procedure:   #1: Revision of left upper arm AV fistula.  Resection of redundant fistula, branch ligation, and plication                       #2: Ultrasound-guided placement of left internal jugular vein tunneled dialysis catheter using fluoroscopic guidance  The fistula will not be usable for at least 4 weeks. We will plan incision check in 2-3 weeks  Pending discharge if she leaves today I can come up and remove dressing if not I will leave the dressing until tomorrow.  I encouraged mobility alternated with elevation. She will be discharged today and can remove the dressing tomorrow and may wash with soap and water.  Mosetta Pigeon 03/21/2023 7:04 AM --  Laboratory Lab Results: Recent Labs    03/20/23 0847 03/20/23 1348  HGB 11.6* 7.1*  HCT 34.0* 21.0*   BMET Recent Labs    03/20/23 0902 03/20/23 1348 03/20/23 1410  NA 137 137 138  K 5.2* 6.9* 7.1*  CL 101  --  105  CO2 24  --  25  GLUCOSE 82  --  86  BUN 36*  --  37*  CREATININE 6.35*  --  6.47*  CALCIUM 8.5*  --  9.6    COAG Lab Results  Component Value Date   INR 1.2 07/16/2022   INR 1.1 07/23/2020   INR 1.1 10/07/2019   No results found for: "PTT"

## 2023-03-22 LAB — TYPE AND SCREEN
Antibody Screen: NEGATIVE
Unit division: 0
Unit division: 0

## 2023-03-22 LAB — BPAM RBC
Blood Product Expiration Date: 202408072359
ISSUE DATE / TIME: 202407111733
ISSUE DATE / TIME: 202407112310
ISSUE DATE / TIME: 202407120245
Unit Type and Rh: 5100
Unit Type and Rh: 5100

## 2023-03-23 NOTE — Discharge Summary (Signed)
[facility] Discharge Summary   Patient ID:  Dominique Lawrence MRN: [id number] DOB/AGE: 2001-03-26 21 y.o.  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   Hearing loss    Bilateral hearing aids   History of blood transfusion    History of blood transfusion    Hypertension    Influenza B    S/P wisdom tooth extraction 06/22/2016   Right mandibular dental pain   Sinus tachycardia    Wears glasses     Procedure(s): LEFT ARM FISTULA PLICATION INSERTION OF DIALYSIS CATHETER USING 23cm PALINDROME PRECISION CHRONIC CATHETER  Discharged Condition: good  HPI: This is a 22 year old female with end-stage renal disease. She has a aneurysmal fistula throughout her upper arm with skin thinning and ulceration. She comes in today for revision of her fistula. She will need a catheter while her fistula heals    Hospital Course:  Dominique Lawrence is a 22 y.o. female is S/P  Procedure(s): LEFT ARM FISTULA PLICATION INSERTION OF DIALYSIS CATHETER USING 23cm PALINDROME PRECISION CHRONIC CATHETER Left UE ace wrap intact for compression.  Dressing clean and dry. Left UE motor intact, skin warm to touch sensation decreased slightly Left TDC used 03/20/23 without issues per patient Lungs non labored breathing The fistula will not be usable for at least 4 weeks. We will plan incision check in 2-3 weeks    Consults:  Treatment  Team:  Delano Metz, MD  Significant Diagnostic Studies: CBC Lab Results  Component Value Date   WBC 9.7 03/21/2023   HGB 8.2 (L) 03/21/2023   HCT 25.2 (L) 03/21/2023   MCV 92.6 03/21/2023   PLT 113 (L) 03/21/2023    BMET    Component Value Date/Time   NA 136 03/21/2023 0745   K 4.4 03/21/2023 0745   CL 99 03/21/2023 0745   CO2 27 03/21/2023 0745   GLUCOSE 100 (H) 03/21/2023 0745   BUN 19 03/21/2023 0745   CREATININE 4.05 (H) 03/21/2023 0745   CREATININE 3.52 (H) 08/11/2017 1155   CALCIUM 8.0 (L) 03/21/2023 0745   GFRNONAA 15 (L) 03/21/2023 0745   GFRAA 7 (L) 01/25/2020 1505   COAG Lab Results  Component Value Date   INR 1.2 07/16/2022   INR 1.1 07/23/2020   INR 1.1 10/07/2019     Disposition:  Discharge to :Home Discharge Instructions     Call MD for:  redness, tenderness, or signs of infection (pain, swelling, bleeding, redness, odor or green/yellow discharge around incision site)   Complete by: As directed    Call MD for:  severe or increased pain, loss or decreased feeling  in affected limb(s)   Complete by: As directed    Call MD for:  temperature >100.5   Complete by: As directed    Discharge instructions   Complete by: As directed    Remove the left UE ace wrap and dressing 48 hours from surgery tomorrow 03/21/23.  Light dressing with  guaze if any drainage.  Soap and water cleaning is fine and encouraged.   Resume previous diet   Complete by: As directed       Allergies as of 03/21/2023       Reactions   Enalapril Other (See Comments)   AKI   Chlorhexidine Gluconate Hives   Ibuprofen Other (See Comments)   Pt not able to take ibuprofen due to Alport's syndrome   Nsaids Other (See Comments)   Other reaction(s): Unknown  AKI        Medication List     TAKE these medications    acetaminophen 500 MG tablet Commonly known as: TYLENOL Take 500 mg by mouth every 6 (six) hours as needed for moderate pain or headache.   amLODipine 10 MG  tablet Commonly known as: NORVASC Take 1 tablet (10 mg total) by mouth daily. Please make overdue appt with Dr. Eden Emms before anymore refills. Thank you 2nd attempt   carvedilol 25 MG tablet Commonly known as: COREG TAKE 1 TABLET(25 MG) BY MOUTH TWICE DAILY WITH A MEAL   hydrALAZINE 100 MG tablet Commonly known as: APRESOLINE Take 100 mg by mouth 3 (three) times daily.   losartan 25 MG tablet Commonly known as: COZAAR Take 50 mg by mouth at bedtime.   megestrol 40 MG tablet Commonly known as: MEGACE Take 1 tablet (40 mg total) by mouth daily.   ondansetron 4 MG tablet Commonly known as: ZOFRAN Take 4 mg by mouth every 8 (eight) hours as needed for nausea or vomiting.   oxyCODONE-acetaminophen 5-325 MG tablet Commonly known as: PERCOCET/ROXICET Take 1 tablet by mouth every 6 (six) hours as needed for moderate pain.       Verbal and written Discharge instructions given to the patient. Wound care per Discharge AVS  Follow-up Information     Nada Libman, MD Follow up in 2 week(s).   Specialties: Vascular Surgery, Cardiology Why: Office will call you to arrange your appt (sent) Contact information: [address] [place] [address] [phone]                 Signed: Mosetta Pigeon 03/23/2023, 8:41 AM

## 2023-03-25 ENCOUNTER — Telehealth: Payer: Self-pay

## 2023-03-25 NOTE — Telephone Encounter (Signed)
Pt called c/o continued swelling in her arm since surgery and wanted advice.  Reviewed pt's chart, returned call for clarification, two identifiers used. Pt denied any other symptoms and not having pain. Instructed her on how to elevate and use a light ACE wrap for compression to assist with swelling. Pt to call back if worsening symptoms. Confirmed understanding.

## 2023-04-03 NOTE — Progress Notes (Signed)
POST OPERATIVE OFFICE NOTE    CC:  F/u for surgery  HPI:  This is a 22 y.o. female who is s/p revision of left upper arm AV fistula with extensive plication, resection of redundant fistula, and branch ligation.  She also required placement of a left IJ TDC.  TDC is functioning well during dialysis.  She dialyzes on a Monday Wednesday Friday schedule at the [facility] location.  She has noticed edema of her left forearm and hand which has been impacting her grip strength.  She has not noticed any drainage from her incisions and she believes these are healing well.  Allergies  Allergen Reactions   Enalapril Other (See Comments)    AKI   Chlorhexidine Gluconate Hives   Ibuprofen Other (See Comments)    Pt not able to take ibuprofen due to Alport's syndrome   Nsaids Other (See Comments)    Other reaction(s): Unknown  AKI    Current Outpatient Medications  Medication Sig Dispense Refill   acetaminophen (TYLENOL) 500 MG tablet Take 500 mg by mouth every 6 (six) hours as needed for moderate pain or headache.     amLODipine (NORVASC) 10 MG tablet Take 1 tablet (10 mg total) by mouth daily. Please make overdue appt with Dr. Eden Emms before anymore refills. Thank you 2nd attempt 15 tablet 0   carvedilol (COREG) 25 MG tablet TAKE 1 TABLET(25 MG) BY MOUTH TWICE DAILY WITH A MEAL 180 tablet 2   hydrALAZINE (APRESOLINE) 100 MG tablet Take 100 mg by mouth 3 (three) times daily.     losartan (COZAAR) 25 MG tablet Take 50 mg by mouth at bedtime.     megestrol (MEGACE) 40 MG tablet Take 1 tablet (40 mg total) by mouth daily. 14 tablet 1   ondansetron (ZOFRAN) 4 MG tablet Take 4 mg by mouth every 8 (eight) hours as needed for nausea or vomiting.     oxyCODONE-acetaminophen (PERCOCET/ROXICET) 5-325 MG tablet Take 1 tablet by mouth every 6 (six) hours as needed for moderate pain. 30 tablet 0   Current Facility-Administered Medications  Medication Dose Route Frequency Provider Last Rate Last Admin    0.9 %  sodium chloride infusion  250 mL Intravenous PRN Nada Libman, MD       sodium chloride flush (NS) 0.9 % injection 3 mL  3 mL Intravenous Q12H Nada Libman, MD         ROS:  See HPI  Physical Exam:  Vitals:   04/07/23 1320  BP: (!) 195/110  Pulse: 89  Resp: 18  Temp: 98.1 F (36.7 C)  TempSrc: Temporal  SpO2: 95%  Weight: 127 lb (57.6 kg)  Height: 5\' 4"  (1.626 m)    Incision: Incisions of left arm are well-appearing Extremities: Palpable left radial pulse; palpable thrill throughout left arm fistula Neuro: A&O   Assessment/Plan:  This is a 22 y.o. female who is s/p: Extensive revision of left upper arm fistula including plication, resection of redundant fistula, and branch ligation as well as placement of left IJ TDC  -Left arm AV fistula is patent with a palpable thrill throughout the upper arm.  Patient also has a well-perfused left hand with palpable radial pulse.  She is experiencing edema of the forearm and hand which has impacted her grip strength.  Given the edema I will favor leaving the sutures in for another 7 to 10 days.  We will wrap her hand and forearm with an Ace wrap to promote fluid return to  the heart.  I also encouraged her to elevate her arm and exercise her hand during the day.  Sutures were removed from the [facility] incision.  She will return next week for suture removal from the left arm.  She will continue HD via [facility] for likely another 3 to 4 weeks due to the extensive nature of her fistula revision.   Dominique Rutter, PA-C [facility] [phone]  Clinic MD:  Lenell Antu

## 2023-04-07 ENCOUNTER — Ambulatory Visit (INDEPENDENT_AMBULATORY_CARE_PROVIDER_SITE_OTHER): Payer: Medicare Other | Admitting: Physician Assistant

## 2023-04-07 VITALS — BP 195/110 | HR 89 | Temp 98.1°F | Resp 18 | Ht 64.0 in | Wt 127.0 lb

## 2023-04-07 DIAGNOSIS — T82898A Other specified complication of vascular prosthetic devices, implants and grafts, initial encounter: Secondary | ICD-10-CM

## 2023-04-07 DIAGNOSIS — Z992 Dependence on renal dialysis: Secondary | ICD-10-CM

## 2023-04-07 DIAGNOSIS — N186 End stage renal disease: Secondary | ICD-10-CM

## 2023-04-17 ENCOUNTER — Ambulatory Visit (INDEPENDENT_AMBULATORY_CARE_PROVIDER_SITE_OTHER): Payer: Medicare Other | Admitting: Physician Assistant

## 2023-04-17 VITALS — BP 219/126 | HR 90 | Temp 97.8°F | Resp 16 | Ht 64.0 in | Wt 130.3 lb

## 2023-04-17 DIAGNOSIS — Z992 Dependence on renal dialysis: Secondary | ICD-10-CM

## 2023-04-17 DIAGNOSIS — N186 End stage renal disease: Secondary | ICD-10-CM

## 2023-04-17 NOTE — Progress Notes (Signed)
    Postoperative Access Visit   History of Present Illness   Dominique Lawrence is a 22 y.o. year old female is status post extensive plication, resection of left arm AV fistula with branch ligation.  She also required placement of left IJ TDC.  She states the [facility] is working well for dialysis.  She is dialyzing on a Monday Wednesday Friday schedule at the [facility] location.  Edema of her left hand and forearm improved using daily Ace wrap as well as elevation of the arm.  She believes her incision is healing well.   Physical Examination   Vitals:   04/17/23 1001  BP: (!) 219/126  Pulse: 90  Resp: 16  Temp: 97.8 F (36.6 C)  TempSrc: Temporal  SpO2: 94%  Weight: 130 lb 4.8 oz (59.1 kg)  Height: 5\' 4"  (1.626 m)   Body mass index is 22.37 kg/m.  left arm Incision is healing well, palpable  radial pulse, hand grip is 5/5, sensation in digits is intact, palpable thrill, bruit can be auscultated     Medical Decision Making   Dominique Lawrence is a 22 y.o. year old female who presents s/p Extensive revision of left upper arm fistula including plication, resection of redundant fistula, and branch ligation as well as placement of left IJ TDC   Patent L arm AVF without signs or symptoms of steal syndrome Sutures removed from arm and steri strips applied The patient's access will be ready for use 04/11/23 The patient's tunneled dialysis catheter can be removed when Nephrology is comfortable with the performance of the fistula The patient may follow up on a prn basis   Emilie Rutter PA-C [facility] [place] Office: [phone]  Clinic MD:  Edilia Bo

## 2023-05-28 ENCOUNTER — Telehealth (HOSPITAL_COMMUNITY): Payer: Self-pay | Admitting: *Deleted

## 2023-05-28 NOTE — Telephone Encounter (Signed)
  AD[facility]ANCED [facility] FAILURE [facility]   Pre-operative Risk Assessment   HEARTCARE STAFF-IMPORTANT INSTRUCTIONS 1 Red and Blue Text will auto delete once note is signed or closed. 2 Press F2 to navigate through template.   3 On drop down lists, L click to select >> R click to activate next field 4 Reason for Visit format is IMPORTANT!!  See Directions on No. 2 below. 5 Please review chart to determine if there is already a clearance note open for this procedure!!  DO NOT duplicate if a note already exists!!    :1}      Request for Surgical Clearance    Procedure:   Kidney Transplant  Date of Surgery:  Clearance TBD                                  Surgeon's Group or Practice Name:  [facility] Phone number:  [phone] Fax number:  [phone]   Type of Clearance Requested:   - Medical    Type of Anesthesia:  General    Additional requests/questions:  Please fax a copy of clearance to the surgeon's office. [phone] Laurann Montana  Please advise Cardiac Risk Assessment:  Low Risk  Increased risk but not prohibitive risk for kidney transplant. Perioperative recommendations: Prohibitive risk Details:  Sent to Dr Gala Romney  Meredith Staggers, RN, BSN, [facility] Specialty Coordinator Advanced Heart Failure [facility]

## 2023-06-05 NOTE — Telephone Encounter (Signed)
Medical clearance sent to [facility] for Transplant

## 2024-06-18 ENCOUNTER — Telehealth: Admitting: Family Medicine

## 2024-06-18 DIAGNOSIS — G43909 Migraine, unspecified, not intractable, without status migrainosus: Secondary | ICD-10-CM

## 2024-06-18 MED ORDER — SUMATRIPTAN SUCCINATE 50 MG PO TABS: 50.0000 mg | ORAL_TABLET | ORAL | 0 refills | Status: AC | PRN

## 2024-06-18 NOTE — Progress Notes (Signed)
 Virtual Visit Consent   Dominique Lawrence, you are scheduled for a virtual visit with a [facility] provider today. Just as with appointments in the office, your consent must be obtained to participate. Your consent will be active for this visit and any virtual visit you may have with one of our providers in the next 365 days. If you have a MyChart account, a copy of this consent can be sent to you electronically.  As this is a virtual visit, video technology does not allow for your provider to perform a traditional examination. This may limit your provider's ability to fully assess your condition. If your provider identifies any concerns that need to be evaluated in person or the need to arrange testing (such as labs, EKG, etc.), we will make arrangements to do so. Although advances in technology are sophisticated, we cannot ensure that it will always work on either your end or our end. If the connection with a video visit is poor, the visit may have to be switched to a telephone visit. With either a video or telephone visit, we are not always able to ensure that we have a secure connection.  By engaging in this virtual visit, you consent to the provision of healthcare and authorize for your insurance to be billed (if applicable) for the services provided during this visit. Depending on your insurance coverage, you may receive a charge related to this service.  I need to obtain your verbal consent now. Are you willing to proceed with your visit today? Dominique Lawrence has provided verbal consent on 06/18/2024 for a virtual visit (video or telephone). Dominique Lamp, FNP  Date: 06/18/2024 11:35 AM   Virtual Visit via Video Note   I, Dominique Lawrence, connected with  Dominique Lawrence  ([id number], 2000/10/10) on 06/18/24 at 11:30 AM EDT by a video-enabled telemedicine application and verified that I am speaking with the correct person using two identifiers.  Location: Patient: Virtual Visit Location  Patient: Home Provider: Virtual Visit Location Provider: Home Office   I discussed the limitations of evaluation and management by telemedicine and the availability of in person appointments. The patient expressed understanding and agreed to proceed.    History of Present Illness: Dominique Lawrence is a 23 y.o. who identifies as a female who was assigned female at birth, and is being seen today for headache off and on since Tuesday. She is on kidney transplant list. HA is better today but she would like migraine med to take if it worsens again. Denies blurred vision and dizziness. Light and sound make it worse.   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   Pericardial effusion 10/07/2019   Secondary hyperparathyroidism of renal origin 09/15/2019   Dyspnea 08/23/2019   COVID-19 virus infection 08/23/2019   Tachycardia 08/23/2019   Elevated troponin 08/23/2019   Hypertension 08/23/2019   ESRD (end stage renal disease) on dialysis (HCC) 08/23/2019   Severe anemia 02/26/2019   Nexplanon  in place 11/23/2017   Avitaminosis D 12/23/2015   Acne vulgaris 09/25/2015   Kidney cysts 09/13/2015   Renal insufficiency 09/12/2015   Constipation 07/07/2015  Hearing loss 03/01/2015   Failed vision screen 09/14/2014   Chronic kidney disease (CKD) stage G1/A1, glomerular filtration rate (GFR) equal to or greater than 90 mL/min/1.73 square meter and albuminuria creatinine ratio less than 30 mg/g 11/13/2013   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   Anemia in chronic kidney disease 07/28/2013   Overweight, pediatric,  BMI 85.0-94.9 percentile for age 50/19/2014    Allergies:  Allergies  Allergen Reactions   Enalapril  Other (See Comments)    AKI   Chlorhexidine  Gluconate Hives   Ibuprofen  Other (See Comments)    Pt not able to take ibuprofen  due to Alport's syndrome   Nsaids Other (See Comments)    Other reaction(s): Unknown  AKI   Medications:  Current Outpatient Medications:    acetaminophen  (TYLENOL ) 500 MG tablet, Take 500 mg by mouth every 6 (six) hours as needed for moderate pain or headache., Disp: , Rfl:    amLODipine  (NORVASC ) 10 MG tablet, Take 1 tablet (10 mg total) by mouth daily. Please make overdue appt with Dr. Nishan before anymore refills. Thank you 2nd attempt, Disp: 15 tablet, Rfl: 0   carvedilol  (COREG ) 25 MG tablet, TAKE 1 TABLET(25 MG) BY MOUTH TWICE DAILY WITH A MEAL, Disp: 180 tablet, Rfl: 2   hydrALAZINE  (APRESOLINE ) 100 MG tablet, Take 100 mg by mouth 3 (three) times daily., Disp: , Rfl:    losartan  (COZAAR ) 25 MG tablet, Take 50 mg by mouth at bedtime., Disp: , Rfl:    megestrol  (MEGACE ) 40 MG tablet, Take 1 tablet (40 mg total) by mouth daily., Disp: 14 tablet, Rfl: 1   ondansetron  (ZOFRAN ) 4 MG tablet, Take 4 mg by mouth every 8 (eight) hours as needed for nausea or vomiting., Disp: , Rfl:    oxyCODONE -acetaminophen  (PERCOCET/ROXICET) 5-325 MG tablet, Take 1 tablet by mouth every 6 (six) hours as needed for moderate pain., Disp: 30 tablet, Rfl: 0  Current Facility-Administered Medications:    0.9 %  sodium chloride  infusion, 250 mL, Intravenous, PRN, Serene Gaile ORN, MD   sodium chloride  flush (NS) 0.9 % injection 3 mL, 3 mL, Intravenous, Q12H, Brabham, Gaile ORN, MD  Observations/Objective: Patient is well-developed, well-nourished in no acute distress.  Resting comfortably at home.  Head is normocephalic, atraumatic.  No labored breathing.  Speech is clear and coherent with logical content.  Patient is alert and oriented at baseline.    Assessment and Plan: 1.  Migraine without status migrainosus, not intractable, unspecified migraine type (Primary)  No NSAIDS. Please clear this with your nephrologist prior to taking. UC as needed if sx persist or worsen. She is in no distress.   Follow Up Instructions: I discussed the assessment and treatment plan with the patient. The patient was provided an opportunity to ask questions and all were answered. The patient agreed with the plan and demonstrated an understanding of the instructions.  A copy of instructions were sent to the patient via MyChart unless otherwise noted below.     The patient was advised to call back or seek an in-person evaluation if the symptoms worsen or if the condition fails to improve as anticipated.    Beryl Balz, FNP

## 2024-06-18 NOTE — Patient Instructions (Signed)
